# Patient Record
Sex: Male | Born: 1950
Health system: Southern US, Community
[De-identification: ages and names within clinical notes are randomized; demographics above are authoritative.]

## PROBLEM LIST (undated history)

## (undated) DIAGNOSIS — C4491 Basal cell carcinoma of skin, unspecified: Secondary | ICD-10-CM

## (undated) DIAGNOSIS — K219 Gastro-esophageal reflux disease without esophagitis: Secondary | ICD-10-CM

## (undated) DIAGNOSIS — I480 Paroxysmal atrial fibrillation: Secondary | ICD-10-CM

## (undated) DIAGNOSIS — I499 Cardiac arrhythmia, unspecified: Secondary | ICD-10-CM

## (undated) DIAGNOSIS — C801 Malignant (primary) neoplasm, unspecified: Secondary | ICD-10-CM

## (undated) DIAGNOSIS — K449 Diaphragmatic hernia without obstruction or gangrene: Secondary | ICD-10-CM

## (undated) DIAGNOSIS — I10 Essential (primary) hypertension: Secondary | ICD-10-CM

## (undated) DIAGNOSIS — T4145XA Adverse effect of unspecified anesthetic, initial encounter: Secondary | ICD-10-CM

## (undated) DIAGNOSIS — Z9289 Personal history of other medical treatment: Secondary | ICD-10-CM

## (undated) DIAGNOSIS — M1990 Unspecified osteoarthritis, unspecified site: Secondary | ICD-10-CM

## (undated) DIAGNOSIS — T8859XA Other complications of anesthesia, initial encounter: Secondary | ICD-10-CM

## (undated) DIAGNOSIS — Z87442 Personal history of urinary calculi: Secondary | ICD-10-CM

## (undated) DIAGNOSIS — Z9889 Other specified postprocedural states: Secondary | ICD-10-CM

## (undated) DIAGNOSIS — R931 Abnormal findings on diagnostic imaging of heart and coronary circulation: Secondary | ICD-10-CM

## (undated) HISTORY — DX: Basal cell carcinoma of skin, unspecified: C44.91

## (undated) HISTORY — DX: Essential (primary) hypertension: I10

## (undated) HISTORY — DX: Personal history of other medical treatment: Z92.89

## (undated) HISTORY — PX: KNEE SURGERY: SHX244

## (undated) HISTORY — PX: HERNIA REPAIR: SHX51

## (undated) HISTORY — PX: SHOULDER SURGERY: SHX246

## (undated) HISTORY — DX: Cardiac arrhythmia, unspecified: I49.9

## (undated) HISTORY — DX: Paroxysmal atrial fibrillation: I48.0

## (undated) HISTORY — DX: Abnormal findings on diagnostic imaging of heart and coronary circulation: R93.1

## (undated) HISTORY — DX: Other specified postprocedural states: Z98.890

## (undated) HISTORY — DX: Diaphragmatic hernia without obstruction or gangrene: K44.9

---

## 1988-12-26 HISTORY — PX: HERNIA REPAIR: SHX51

## 2001-08-01 ENCOUNTER — Ambulatory Visit (HOSPITAL_COMMUNITY): Admission: RE | Admit: 2001-08-01 | Discharge: 2001-08-01 | Payer: Self-pay | Admitting: Gastroenterology

## 2002-11-18 ENCOUNTER — Encounter: Payer: Self-pay | Admitting: Family Medicine

## 2002-11-18 ENCOUNTER — Encounter: Admission: RE | Admit: 2002-11-18 | Discharge: 2002-11-18 | Payer: Self-pay | Admitting: Family Medicine

## 2003-12-27 DIAGNOSIS — Z9289 Personal history of other medical treatment: Secondary | ICD-10-CM

## 2003-12-27 HISTORY — DX: Personal history of other medical treatment: Z92.89

## 2003-12-27 HISTORY — PX: CARDIAC CATHETERIZATION: SHX172

## 2004-08-24 ENCOUNTER — Inpatient Hospital Stay (HOSPITAL_BASED_OUTPATIENT_CLINIC_OR_DEPARTMENT_OTHER): Admission: RE | Admit: 2004-08-24 | Discharge: 2004-08-24 | Payer: Self-pay | Admitting: Interventional Cardiology

## 2008-05-01 ENCOUNTER — Encounter: Admission: RE | Admit: 2008-05-01 | Discharge: 2008-05-01 | Payer: Self-pay | Admitting: Sports Medicine

## 2008-07-22 ENCOUNTER — Ambulatory Visit (HOSPITAL_COMMUNITY): Admission: RE | Admit: 2008-07-22 | Discharge: 2008-07-22 | Payer: Self-pay | Admitting: Gastroenterology

## 2008-07-22 ENCOUNTER — Encounter (INDEPENDENT_AMBULATORY_CARE_PROVIDER_SITE_OTHER): Payer: Self-pay | Admitting: Gastroenterology

## 2011-05-10 NOTE — Op Note (Signed)
NAME:  David Vargas, David Vargas                ACCOUNT NO.:  [id number]   MEDICAL RECORD NO.:  [id number]          PATIENT TYPE:  AMB   LOCATION:  ENDO                         FACILITY:  [facility]   PHYSICIAN:  James L. Malon Kindle., M.D.DATE OF BIRTH:  March 08, 1951   DATE OF PROCEDURE:  07/22/2008  DATE OF DISCHARGE:                               OPERATIVE REPORT   PROCEDURE:  Colonoscopy and polypectomy.   MEDICATIONS:  Fentanyl 125 mcg and Versed 12.5 mg IV.   INDICATION:  A 60 year old gentleman with a strong family history of  colon cancer and polyps.  He had a colonoscopy a year ago with a flat  polyp in the ascending colon that was sheared down and recovered.  This  is done to make absolutely  certain that this polyp was gone, some of  the pieces were lost, and the pieces that were recovered showed  hyperplastic, but he does have a strong family history.   DESCRIPTION OF PROCEDURE:  Procedure had been explained to the patient  and consent obtained.  In the left lateral decubitus position, Pentax  pediatric scope was inserted and advanced.  The prep was somewhat  marginal with some sticky adherent pieces of solid material.  We reached  the cecum, ileocecal valve and appendiceal orifice were seen.  The scope  was withdrawn and the ascending colon with scar was seen where he had  had the previous polyp removed.  The transverse colon was seen well and  the mid descending colon approximately 80 cm, a polyp on a short stalk  was seen.  It was about 1 cm.  This was removed with a snare and sucked  through the scope.  The remainder of the descending colon was fine.  The  sigmoid colon revealed marked diverticulosis as before, no gross polyps  seen.  The rectum was free of polyps and retroflexion view was basically  normal.  Colon was decompressed.  The patient tolerated the procedure  well.  There were no immediate complications.   ASSESSMENT:  1. No residual polyp in the ascending colon.  2. New  polyp removed from the descending colon.   PLAN:  Routine post polypectomy instructions.  We will recommend  repeating procedure in 3 years.           ______________________________  Llana Aliment Malon Kindle., M.D.     Waldron Session  D:  07/22/2008  T:  07/23/2008  Job:  [id number]   cc:   Vikki Ports, M.D.

## 2011-05-13 NOTE — Cardiovascular Report (Signed)
NAME:  David Vargas, David Vargas                          ACCOUNT NO.:  [id number]   MEDICAL RECORD NO.:  [id number]                   PATIENT TYPE:  OIB   LOCATION:  6501                                 FACILITY:  [facility]   PHYSICIAN:  Lyn Records III, M.D.            DATE OF BIRTH:  March 12, 1951   DATE OF PROCEDURE:  08/24/2004  DATE OF DISCHARGE:                              CARDIAC CATHETERIZATION   INDICATION:  Exertional dyspnea with recent Cardiolite study demonstrating a  mid anterior wall defect and inferobasal defect. These findings were felt to  possibly represent coronary artery disease.  This study is being done to  evaluate for the presence of obstructive lesions.   PROCEDURE PERFORMED:  1.  Left heart catheterization.  2.  Selective coronary angiography.  3.  Left ventriculography.   DESCRIPTION:  After informed consent, a 4-French sheath was placed in the  right femoral artery using modified Seldinger technique.  A 4-French #4  right Judkins catheter was used for hemodynamic recordings, left  ventriculography by hand injection and selective right coronary angiography.  A 4-French #4 left Judkins catheter was used for left coronary angiography.  The patient tolerated the procedure without complications.   RESULTS:   I. HEMODYNAMIC DATA:  A.  Aortic pressure 121/82.  B.  Left ventricular pressure 124/20.   II. LEFT VENTRICULOGRAPHY:  Faint opacification is noted.  Overall  contractility appears normal.  Anterior and inferior walls appear to move  reasonably well.  No obvious regional wall motion abnormality.   III. CORONARY ANGIOGRAPHY:  A.  Left main coronary:  Normal.  B.  Left anterior descending coronary:  This is a large vessel that gives  origin to a large diagonal.  The LAD is transapical.  The mid LAD dipped  into what appears to be the interventricular septum.  Minimal systolic  pressure is noted on the distal limb of the LAD that transitioned out of the  severe  angulation.  The vessel has no significant obstructive lesion.  Diagonal was large and also free of any significant obstructive lesion.  Minimal luminal irregularities are noted in the mid LAD.  C.  Circumflex artery:  Circumflex trifurcates into three obtuse marginal  branches with minimal luminal irregularities noted.  No significant  obstruction is seen.  D.  Right coronary:  The right coronary artery is a large vessel.  It gives  rise to PDA and two small left ventricular branches.  This artery is  entirely normal.   CONCLUSIONS:  1.  Probable intramyocardial mid left anterior descending segment with      minimal systolic depression.  2.  No evidence of significant coronary atherosclerosis is noted in any of      the coronary vascular beds.  3.  Normal left ventricular function.  4.  Mildly elevated left ventricular end-diastolic pressure.   PLAN:  The patient should be monitored for the possible presence of  hypertension that has not currently been detected and if dyspnea continues  would recommend low dose diuretic therapy to possibly decrease LVEDP and  help better control his blood pressure.  No further cardiac evaluation is  felt to be present at this time unless dyspnea is progressive.  If dyspnea  is progressive, I would recommend a 2-D Doppler echocardiogram to rule out  LVH and possibility of an underlying hypertrophic cardiomyopathy.                                               Lesleigh Noe, M.D.    HWS/MEDQ  D:  08/24/2004  T:  08/24/2004  Job:  [id number]   cc:   Vikki Ports, M.D.  [address]  [place]  [place] [address]  Fax: [phone]

## 2011-05-13 NOTE — Procedures (Signed)
[facility]  Patient:    AUL, MANGIERI                       MRN: [id number] Proc. Date: 08/01/01 Adm. Date:  [id number] Attending:  Orland Mustard CC:         Vikki Ports, M.D.   Procedure Report  PROCEDURE PERFORMED:  Colonoscopy.  ENDOSCOPIST:  Llana Aliment. Randa Evens, M.D.  MEDICATIONS SED:  Fentanyl 10 mcg, Versed ____________ mg IV.  INSTRUMENT:  INDICATIONS:  Strong family history of colon polyps.  DESCRIPTION OF PROCEDURE:  The procedure had been explained to the patient and consent obtained.  With the patient in the left lateral decubitus position, the Olympus pediatric video colonoscope was inserted and advanced under direct visualization.  The prep was excellent and we were able to advance to the cecum without difficulty.  The right lower quadrant transilluminated.  The ileocecal valve seen.  The scope was withdrawn.  The cecum, ascending colon, hepatic flexure, transverse colon, splenic flexure, descending and sigmoid colon were seen well upon withdrawal.  No significant diverticular disease, no polyps seen.  Scope withdrawn, patient tolerated the procedure well. ASSESSMENT:  No evidence of colon polyps.  PLAN:  Will recommend repeating in five years. DD:  08/01/01 TD:  08/01/01 Job: [id number]

## 2011-10-26 ENCOUNTER — Other Ambulatory Visit: Payer: Self-pay | Admitting: Gastroenterology

## 2011-10-31 ENCOUNTER — Other Ambulatory Visit: Payer: Self-pay | Admitting: Dermatology

## 2012-03-24 ENCOUNTER — Emergency Department (HOSPITAL_BASED_OUTPATIENT_CLINIC_OR_DEPARTMENT_OTHER)
Admission: EM | Admit: 2012-03-24 | Discharge: 2012-03-24 | Disposition: A | Discharge status: Home / Self Care | Payer: 59 | Attending: Emergency Medicine | Admitting: Emergency Medicine

## 2012-03-24 ENCOUNTER — Encounter (HOSPITAL_BASED_OUTPATIENT_CLINIC_OR_DEPARTMENT_OTHER): Payer: Self-pay

## 2012-03-24 DIAGNOSIS — N200 Calculus of kidney: Secondary | ICD-10-CM | POA: Insufficient documentation

## 2012-03-24 DIAGNOSIS — R112 Nausea with vomiting, unspecified: Secondary | ICD-10-CM | POA: Insufficient documentation

## 2012-03-24 DIAGNOSIS — R109 Unspecified abdominal pain: Secondary | ICD-10-CM | POA: Insufficient documentation

## 2012-03-24 LAB — BASIC METABOLIC PANEL
BUN: 20 mg/dL (ref 6–23)
CO2: 23 mEq/L (ref 19–32)
Calcium: 9.7 mg/dL (ref 8.4–10.5)
Creatinine, Ser: 1.1 mg/dL (ref 0.50–1.35)
Glucose, Bld: 126 mg/dL — ABNORMAL HIGH (ref 70–99)

## 2012-03-24 MED ORDER — ONDANSETRON HCL 4 MG PO TABS: 4.0000 mg | ORAL_TABLET | Freq: Four times a day (QID) | ORAL | Status: AC

## 2012-03-24 MED ORDER — OXYCODONE-ACETAMINOPHEN 5-325 MG PO TABS: 1.0000 | ORAL_TABLET | ORAL | Status: AC | PRN

## 2012-03-24 MED ORDER — SODIUM CHLORIDE 0.9 % IV BOLUS (SEPSIS)
1000.0000 mL | Freq: Once | INTRAVENOUS | Status: AC
  Administered 2012-03-24: 1000 mL via INTRAVENOUS

## 2012-03-24 MED ORDER — TAMSULOSIN HCL 0.4 MG PO CAPS: 0.4000 mg | ORAL_CAPSULE | ORAL | Status: DC

## 2012-03-24 NOTE — ED Notes (Signed)
Pt is unable to provide urine specimen at this time.  ?

## 2012-03-24 NOTE — Discharge Instructions (Signed)

## 2012-03-24 NOTE — ED Provider Notes (Signed)
History     CSN: [id number]  Arrival date & time 03/24/12  1058   First MD Initiated Contact with Patient 03/24/12 1129      Chief Complaint  Patient presents with  . Flank Pain    (Consider location/radiation/quality/duration/timing/severity/associated sxs/prior treatment) HPI Comments: Patient began having flank pain on his right side last night.  He started to note some mild lower abdominal crampiness as well.  He fell he was unable to get comfortable last night.  He went to his primary care physician this morning and was found to have acute hematuria.  Patient had noted tissue and looked dark as well.  Patient's also had associated nausea and vomiting without fevers.  He's noted that the pain has been in his flank radiating down towards his lower abdomen.  He has been able to urinate but notes decreased urination.  He's had persistent nausea and vomiting but no diarrhea.  Patient has had prior kidney stones in the last time was approximate 6 months ago.  Patient states this feels similar.  His primary care physician sent him over here for IV fluids given the amount of ketones in his urine and his history of nausea and vomiting.  They also gave him Toradol and Phenergan for pain and nausea and the patient now reports that he has no further pain and no nausea at this time.  Patient is a 61 y.o. male presenting with flank pain. The history is provided by the patient. No language interpreter was used.  Flank Pain This is a recurrent problem. The current episode started yesterday. The problem occurs constantly. The problem has been gradually worsening. Associated symptoms include abdominal pain. Pertinent negatives include no chest pain, no headaches and no shortness of breath. The symptoms are aggravated by nothing. The symptoms are relieved by nothing.    Past Medical History  Diagnosis Date  . Kidney stones     Past Surgical History  Procedure Date  . Knee surgery   . Hernia repair     . Shoulder surgery     History reviewed. No pertinent family history.  History  Substance Use Topics  . Smoking status: Never Smoker   . Smokeless tobacco: Never Used  . Alcohol Use: Yes     few times a week      Review of Systems  Constitutional: Negative.  Negative for fever and chills.  HENT: Negative.   Eyes: Negative.  Negative for discharge and redness.  Respiratory: Negative.  Negative for cough and shortness of breath.   Cardiovascular: Negative.  Negative for chest pain.  Gastrointestinal: Positive for abdominal pain. Negative for nausea and vomiting.  Genitourinary: Positive for hematuria and flank pain.  Musculoskeletal: Negative.  Negative for back pain.  Skin: Negative.  Negative for color change and rash.  Neurological: Negative for syncope and headaches.  Hematological: Negative.  Negative for adenopathy.  Psychiatric/Behavioral: Negative.  Negative for confusion.  All other systems reviewed and are negative.    Allergies  Review of patient's allergies indicates no known allergies.  Home Medications   Current Outpatient Rx  Name Route Sig Dispense Refill  . CETIRIZINE HCL 10 MG PO TABS Oral Take 10 mg by mouth daily.    Marland Kitchen FLUTICASONE PROPIONATE 50 MCG/ACT NA SUSP Nasal Place 2 sprays into the nose daily.    Marland Kitchen KETOROLAC TROMETHAMINE 60 MG/2ML IJ SOLN Intramuscular Inject 60 mg into the muscle once.    Marland Kitchen LOSARTAN POTASSIUM 25 MG PO TABS Oral Take 25  mg by mouth daily.    Marland Kitchen PROMETHAZINE HCL 25 MG/ML IJ SOLN Intramuscular Inject 25 mg into the muscle once.      BP 108/76  Pulse 104  Temp(Src) 98.7 F (37.1 C) (Oral)  Resp 16  Ht 6\' 5"  (1.956 m)  Wt 280 lb (127.007 kg)  BMI 33.20 kg/m2  SpO2 97%  Physical Exam  Nursing note and vitals reviewed. Constitutional: He is oriented to person, place, and time. He appears well-developed and well-nourished.  Non-toxic appearance. He does not have a sickly appearance.  HENT:  Head: Normocephalic and  atraumatic.  Eyes: Conjunctivae, EOM and lids are normal. Pupils are equal, round, and reactive to light.  Neck: Trachea normal, normal range of motion and full passive range of motion without pain. Neck supple.  Cardiovascular: Normal rate, regular rhythm and normal heart sounds.   Pulmonary/Chest: Effort normal and breath sounds normal. No respiratory distress.  Abdominal: Soft. Normal appearance. He exhibits no distension. There is no tenderness. There is no rebound and no CVA tenderness.  Musculoskeletal: Normal range of motion.  Neurological: He is alert and oriented to person, place, and time. He has normal strength.  Skin: Skin is warm, dry and intact. No rash noted.  Psychiatric: He has a normal mood and affect. His behavior is normal. Judgment and thought content normal.    ED Course  Procedures (including critical care time)  Results for orders placed during the hospital encounter of 03/24/12  BASIC METABOLIC PANEL      Component Value Range   Sodium 139  135 - 145 (mEq/L)   Potassium 3.9  3.5 - 5.1 (mEq/L)   Chloride 103  96 - 112 (mEq/L)   CO2 23  19 - 32 (mEq/L)   Glucose, Bld 126 (*) 70 - 99 (mg/dL)   BUN 20  6 - 23 (mg/dL)   Creatinine, Ser 1.61  0.50 - 1.35 (mg/dL)   Calcium 9.7  8.4 - 09.6 (mg/dL)   GFR calc non Af Amer 70 (*) >90 (mL/min)   GFR calc Af Amer 81 (*) >90 (mL/min)    Patient's urinalysis from the [facility] physicians shows a specific gravity of 1.025, leukocyte negative, nitrate negative, glucose negative, ketones greater than 160, bili moderate, blood large.  Patient also had a CBC completed which showed a white blood cell count of 12.1 and a hemoglobin of 15.8.  MDM  Patient has a history that is consistent with kidney stone given the hematuria and the characteristic pain that he describes today.  As the patient is comfortable at this time he is declining further pain or nausea medicines.  He is here purely for some IV fluids and I will continue to  hydrate him here and recheck in his symptoms.  As the patient has a prior diagnosis of kidney stones in the characteristic story with blood I do not feel the need to perform another CT scan in this patient today.  I will have him followup with his primary care physician and with urology later this week for reevaluation if needed but he is likely to pass the stone on his own.        Nat Christen, MD 03/24/12 [phone]

## 2012-03-24 NOTE — ED Notes (Signed)
Pt states that he had onset of abdominal pain and R flank pain yesterday evening.  Pt states that he was seen by [facility] physicians and given pain and nausea medications, dx with kidney stones, sent here for IVF.  Hasn't been able to keep down food or fluids since last night.

## 2012-03-28 ENCOUNTER — Ambulatory Visit
Admission: RE | Admit: 2012-03-28 | Discharge: 2012-03-28 | Disposition: A | Discharge status: Home / Self Care | Payer: 59 | Source: Ambulatory Visit | Attending: Family Medicine | Admitting: Family Medicine

## 2012-03-28 ENCOUNTER — Other Ambulatory Visit: Payer: Self-pay | Admitting: Family Medicine

## 2012-03-28 DIAGNOSIS — R109 Unspecified abdominal pain: Secondary | ICD-10-CM

## 2012-12-24 ENCOUNTER — Other Ambulatory Visit: Payer: Self-pay | Admitting: Dermatology

## 2013-01-23 ENCOUNTER — Other Ambulatory Visit: Payer: Self-pay | Admitting: Dermatology

## 2013-05-02 ENCOUNTER — Other Ambulatory Visit: Payer: Self-pay | Admitting: Dermatology

## 2013-05-23 ENCOUNTER — Other Ambulatory Visit: Payer: Self-pay | Admitting: Dermatology

## 2013-05-30 ENCOUNTER — Other Ambulatory Visit: Payer: Self-pay | Admitting: Dermatology

## 2014-01-28 ENCOUNTER — Encounter: Payer: Self-pay | Admitting: Cardiology

## 2014-01-28 DIAGNOSIS — I480 Paroxysmal atrial fibrillation: Secondary | ICD-10-CM | POA: Insufficient documentation

## 2014-01-28 DIAGNOSIS — I4891 Unspecified atrial fibrillation: Secondary | ICD-10-CM

## 2014-01-28 DIAGNOSIS — I10 Essential (primary) hypertension: Secondary | ICD-10-CM

## 2014-02-18 ENCOUNTER — Other Ambulatory Visit: Payer: Self-pay | Admitting: Dermatology

## 2014-05-14 ENCOUNTER — Encounter: Payer: Self-pay | Admitting: Cardiology

## 2014-06-05 ENCOUNTER — Other Ambulatory Visit (HOSPITAL_COMMUNITY): Payer: 59

## 2014-06-05 ENCOUNTER — Ambulatory Visit: Payer: 59 | Admitting: Cardiology

## 2014-08-06 ENCOUNTER — Telehealth: Payer: Self-pay | Admitting: Cardiology

## 2014-08-06 NOTE — Telephone Encounter (Signed)
08-06-14 PT NOW HAS [employer] POLICY#[id number] OM#[id number] (COBRA PLAN) WILL CHANGE TO [employer]BS ADV PLAN 09-25-14/CH

## 2014-08-12 ENCOUNTER — Other Ambulatory Visit (HOSPITAL_COMMUNITY): Payer: 59

## 2014-08-28 ENCOUNTER — Other Ambulatory Visit: Payer: Self-pay | Admitting: Dermatology

## 2014-09-11 ENCOUNTER — Other Ambulatory Visit: Payer: Self-pay | Admitting: General Surgery

## 2014-09-11 DIAGNOSIS — I4891 Unspecified atrial fibrillation: Secondary | ICD-10-CM

## 2014-09-23 ENCOUNTER — Other Ambulatory Visit (INDEPENDENT_AMBULATORY_CARE_PROVIDER_SITE_OTHER): Payer: Commercial Managed Care - PPO

## 2014-09-23 ENCOUNTER — Other Ambulatory Visit: Payer: Self-pay

## 2014-09-23 DIAGNOSIS — I4891 Unspecified atrial fibrillation: Secondary | ICD-10-CM

## 2014-09-29 NOTE — Progress Notes (Signed)
  [place], [place] [address] [place], [place]  [address] Phone: [phone] Fax:  [phone]  Date:  09/30/2014   ID:  David Vargas, DOB [birth year], MRN [id number]  PCP:  Gennette Pac, MD  Cardiologist:  Fransico Him, MD    History of Present Illness: This is a 63yo male with a history of PAF and HTN who presents today for followup.  He is doing well.  He denies any chest pain, SOB, DOE, LE edema, dizziness, palpitations or syncope.   Wt Readings from Last 3 Encounters:  09/30/14 243 lb (110.224 kg)  03/24/12 280 lb (127.007 kg)     Past Medical History  Diagnosis Date  . Kidney stones   . Hypertension   . Basal cell carcinoma     followed by dermatology  . Arrhythmia     paroxysmal afib  . History of nuclear stress test 2005    abnormal, cardiac cath done   . Hiatal hernia     upper GI in 2003  . H/O colonoscopy     2009, polypoid colorectal mucosa found, Dr. Oletta Lamas advised repeat in 3 years    Current Outpatient Prescriptions  Medication Sig Dispense Refill  . aspirin 81 MG tablet Take 81 mg by mouth daily.      . cetirizine (ZYRTEC) 10 MG tablet Take 10 mg by mouth daily.      . fluticasone (FLONASE) 50 MCG/ACT nasal spray Place 2 sprays into the nose daily.      Marland Kitchen losartan (COZAAR) 25 MG tablet Take 25 mg by mouth daily.      Marland Kitchen OVER THE COUNTER MEDICATION Muscle relaxant      . tadalafil (CIALIS) 10 MG tablet Take 10 mg by mouth daily as needed for erectile dysfunction.       No current facility-administered medications for this visit.    Allergies:   No Known Allergies  Social History:  The patient  reports that he has never smoked. He has never used smokeless tobacco. He reports that he drinks alcohol. He reports that he does not use illicit drugs.   Family History:  The patient's family history includes Heart attack in his mother; Liver cancer in his father; Multiple sclerosis in his sister.   ROS:  Please see the history of present illness.       All other systems reviewed and negative.   PHYSICAL EXAM: VS:  BP 120/64  Pulse 71  Ht 6\' 5"  (1.956 m)  Wt 243 lb (110.224 kg)  BMI 28.81 kg/m2 Well nourished, well developed, in no acute distress HEENT: normal Neck: no JVD Cardiac:  normal S1, S2; RRR; no murmur Lungs:  clear to auscultation bilaterally, no wheezing, rhonchi or rales Abd: soft, nontender, no hepatomegaly Ext: no edema Skin: warm and dry Neuro:  CNs 2-12 intact, no focal abnormalities noted  EKG:     NSR at 71bpm with no ST changes  ASSESSMENT AND PLAN:  1. Paroxysmal atrial fibrillation maintaining NSR 2. HTN well controlled - continue Losartan 3.  Mildly dilated aortic root - last echo 08/2014 it was normal  Followup with me in 1 year  Signed, Fransico Him, MD [facility] 09/30/2014 10:14 AM

## 2014-09-30 ENCOUNTER — Ambulatory Visit (INDEPENDENT_AMBULATORY_CARE_PROVIDER_SITE_OTHER): Payer: Medicare Other | Admitting: Cardiology

## 2014-09-30 ENCOUNTER — Encounter: Payer: Self-pay | Admitting: Cardiology

## 2014-09-30 VITALS — BP 120/64 | HR 71 | Ht 77.0 in | Wt 243.0 lb

## 2014-09-30 DIAGNOSIS — I10 Essential (primary) hypertension: Secondary | ICD-10-CM

## 2014-09-30 DIAGNOSIS — I480 Paroxysmal atrial fibrillation: Secondary | ICD-10-CM

## 2014-09-30 NOTE — Patient Instructions (Signed)
Your physician recommends that you continue on your current medications as directed. Please refer to the Current Medication list given to you today.  Your physician wants you to follow-up in: 1 year. You will receive a reminder letter in the mail two months in advance. If you don't receive a letter, please call our office to schedule the follow-up appointment.  

## 2014-10-03 ENCOUNTER — Encounter: Payer: Self-pay | Admitting: Cardiology

## 2014-10-06 ENCOUNTER — Encounter: Payer: Self-pay | Admitting: Cardiology

## 2014-10-07 ENCOUNTER — Encounter: Payer: Self-pay | Admitting: Cardiology

## 2015-07-29 ENCOUNTER — Encounter: Payer: Self-pay | Admitting: Cardiology

## 2015-11-12 ENCOUNTER — Encounter: Payer: Self-pay | Admitting: Cardiology

## 2015-11-12 ENCOUNTER — Ambulatory Visit (INDEPENDENT_AMBULATORY_CARE_PROVIDER_SITE_OTHER): Payer: Self-pay | Admitting: Cardiology

## 2015-11-12 VITALS — BP 102/64 | HR 63 | Ht 77.0 in | Wt 238.8 lb

## 2015-11-12 DIAGNOSIS — I480 Paroxysmal atrial fibrillation: Secondary | ICD-10-CM | POA: Diagnosis not present

## 2015-11-12 DIAGNOSIS — I10 Essential (primary) hypertension: Secondary | ICD-10-CM | POA: Diagnosis not present

## 2015-11-12 DIAGNOSIS — I4891 Unspecified atrial fibrillation: Secondary | ICD-10-CM

## 2015-11-12 NOTE — Progress Notes (Signed)
Cardiology Office Note   Date:  11/12/2015   ID:  David Vargas, DOB 03/27/1951, MRN [id number]  PCP:  David Pac, MD  Cardiologist:  Dr. Radford Vargas    Chief Complaint  Patient presents with  . Hypertension    PAF      History of Present Illness: David Vargas is a 64 y.o. male who presents for PAF.  Last seen by Dr. Radford Vargas last year and he had been maintaining SR.  He also has HTN. He had a cardiac cath in 2005 for + nuc study, no significant CAD though probable intramyocardial mid LAD segment with minimal systolic depression.    Last echo 08/2014 with EF 60-65%, trivial TR .   Today no complaints, no chest pain, no SOB, no palpitations.Marland Kitchen  He is active walking the golf course.  Also with yard care.  His brother died this year with MS.       Past Medical History  Diagnosis Date  . Kidney stones   . Hypertension   . Basal cell carcinoma     followed by dermatology  . Arrhythmia     paroxysmal afib  . History of nuclear stress test 2005    abnormal, cardiac cath done   . Hiatal hernia     upper GI in 2003  . H/O colonoscopy     2009, polypoid colorectal mucosa found, Dr. Oletta Vargas advised repeat in 3 years    Past Surgical History  Procedure Laterality Date  . Knee surgery    . Hernia repair    . Shoulder surgery    . Cardiac catheterization  2005    no evidence of significamt atherosclerosis     Current Outpatient Prescriptions  Medication Sig Dispense Refill  . aspirin 81 MG tablet Take 81 mg by mouth daily.    . cetirizine (ZYRTEC) 10 MG tablet Take 10 mg by mouth daily.    Marland Kitchen esomeprazole (NEXIUM) 40 MG capsule Take 40 mg by mouth daily at 12 noon.    . fluticasone (FLONASE) 50 MCG/ACT nasal spray Place 2 sprays into the nose daily.    Marland Kitchen losartan (COZAAR) 25 MG tablet Take 25 mg by mouth daily.    . tadalafil (CIALIS) 10 MG tablet Take 10 mg by mouth daily as needed for erectile dysfunction.     No current facility-administered medications for this  visit.    Allergies:   Review of patient's allergies indicates no known allergies.    Social History:  The patient  reports that he has never smoked. He has never used smokeless tobacco. He reports that he drinks alcohol. He reports that he does not use illicit drugs.   Family History:  The patient's family history includes Heart attack in his mother; Liver cancer in his father; Multiple sclerosis in his sister.    ROS:  General:no colds or fevers, + weight loss 5 lbs since last year and 42 since 2013.  Skin:no rashes or ulcers HEENT:no blurred vision, no congestion CV:see HPI PUL:see HPI GI:no diarrhea constipation or melena, no indigestion GU:no hematuria, no dysuria MS:no joint pain, no claudication Neuro:no syncope, no lightheadedness Endo:no diabetes, no thyroid disease  Wt Readings from Last 3 Encounters:  11/12/15 238 lb 12.8 oz (108.319 kg)  09/30/14 243 lb (110.224 kg)  03/24/12 280 lb (127.007 kg)     PHYSICAL EXAM: VS:  BP 102/64 mmHg  Pulse 63  Ht 6\' 5"  (1.956 m)  Wt 238 lb 12.8 oz (108.319 kg)  BMI 28.31 kg/m2 , BMI Body mass index is 28.31 kg/(m^2). General:Pleasant affect, NAD Skin:Warm and dry, brisk capillary refill HEENT:normocephalic, sclera clear, mucus membranes moist Neck:supple, no JVD, no bruits  Heart:S1S2 RRR without murmur, gallup, rub or click Lungs:clear without rales, rhonchi, or wheezes [id number], non tender, + BS, do not palpate liver spleen or masses Ext:no lower ext edema, 2+ pedal pulses, 2+ radial pulses Neuro:alert and oriented X 3, MAE, follows commands, + facial symmetry    EKG:  EKG is ordered today. The ekg ordered today demonstrates SR no changes since EKG from last year.    Recent Labs: No results found for requested labs within last 365 days.    Lipid Panel No results found for: CHOL, TRIG, HDL, CHOLHDL, VLDL, LDLCALC, LDLDIRECT     Other studies Reviewed: Additional studies/ records that were reviewed today  include: cath in 2005, last echo. .   ASSESSMENT AND PLAN:  1.  PAF nor further episodes, occurred with kidney stone. On ASA  2. HTN well controlled continue losartan.   3. Lipids per PCP, they will send [place] a copy.   4. Mildly dilated aortic root - last echo 08/2014 it was normal   Current medicines are reviewed with the patient today.  The patient Has no concerns regarding medicines.  The following changes have been made:  See above Labs/ tests ordered today include:see above  Disposition:   FU:  see above  Signed, David Serge, NP  11/12/2015 1:50 PM    [facility] [place], [place], [place], [place] Phone: [phone]; Fax: [phone]

## 2015-11-12 NOTE — Patient Instructions (Signed)
Medication Instructions:   Your physician recommends that you continue on your current medications as directed. Please refer to the Current Medication list given to you today.    If you need a refill on your cardiac medications before your next appointment, please call your pharmacy.  Labwork: NONE ORDER TODAY    Testing/Procedures:  NONE ORDER TODAY    Follow-Up:  Your physician wants you to follow-up in: [place] will receive a reminder letter in the mail two months in advance. If you don't receive a letter, please call our office to schedule the follow-up appointment.     Any Other Special Instructions Will Be Listed Below (If Applicable).

## 2016-02-24 DIAGNOSIS — I10 Essential (primary) hypertension: Secondary | ICD-10-CM | POA: Diagnosis not present

## 2016-02-24 DIAGNOSIS — Z125 Encounter for screening for malignant neoplasm of prostate: Secondary | ICD-10-CM | POA: Diagnosis not present

## 2016-03-02 DIAGNOSIS — Z0000 Encounter for general adult medical examination without abnormal findings: Secondary | ICD-10-CM | POA: Diagnosis not present

## 2016-03-02 DIAGNOSIS — K219 Gastro-esophageal reflux disease without esophagitis: Secondary | ICD-10-CM | POA: Diagnosis not present

## 2016-03-02 DIAGNOSIS — Z85820 Personal history of malignant melanoma of skin: Secondary | ICD-10-CM | POA: Diagnosis not present

## 2016-03-02 DIAGNOSIS — N200 Calculus of kidney: Secondary | ICD-10-CM | POA: Diagnosis not present

## 2016-03-02 DIAGNOSIS — N529 Male erectile dysfunction, unspecified: Secondary | ICD-10-CM | POA: Diagnosis not present

## 2016-03-02 DIAGNOSIS — Z23 Encounter for immunization: Secondary | ICD-10-CM | POA: Diagnosis not present

## 2016-03-02 DIAGNOSIS — I10 Essential (primary) hypertension: Secondary | ICD-10-CM | POA: Diagnosis not present

## 2016-03-02 DIAGNOSIS — J309 Allergic rhinitis, unspecified: Secondary | ICD-10-CM | POA: Diagnosis not present

## 2016-03-02 DIAGNOSIS — I480 Paroxysmal atrial fibrillation: Secondary | ICD-10-CM | POA: Diagnosis not present

## 2016-03-09 DIAGNOSIS — D2271 Melanocytic nevi of right lower limb, including hip: Secondary | ICD-10-CM | POA: Diagnosis not present

## 2016-03-09 DIAGNOSIS — D1801 Hemangioma of skin and subcutaneous tissue: Secondary | ICD-10-CM | POA: Diagnosis not present

## 2016-03-09 DIAGNOSIS — D225 Melanocytic nevi of trunk: Secondary | ICD-10-CM | POA: Diagnosis not present

## 2016-03-09 DIAGNOSIS — D224 Melanocytic nevi of scalp and neck: Secondary | ICD-10-CM | POA: Diagnosis not present

## 2016-03-09 DIAGNOSIS — Z85820 Personal history of malignant melanoma of skin: Secondary | ICD-10-CM | POA: Diagnosis not present

## 2016-03-09 DIAGNOSIS — L218 Other seborrheic dermatitis: Secondary | ICD-10-CM | POA: Diagnosis not present

## 2016-03-09 DIAGNOSIS — D485 Neoplasm of uncertain behavior of skin: Secondary | ICD-10-CM | POA: Diagnosis not present

## 2016-03-09 DIAGNOSIS — D2239 Melanocytic nevi of other parts of face: Secondary | ICD-10-CM | POA: Diagnosis not present

## 2016-03-09 DIAGNOSIS — L570 Actinic keratosis: Secondary | ICD-10-CM | POA: Diagnosis not present

## 2016-03-09 DIAGNOSIS — D2272 Melanocytic nevi of left lower limb, including hip: Secondary | ICD-10-CM | POA: Diagnosis not present

## 2016-03-09 DIAGNOSIS — Z85828 Personal history of other malignant neoplasm of skin: Secondary | ICD-10-CM | POA: Diagnosis not present

## 2016-03-17 DIAGNOSIS — R21 Rash and other nonspecific skin eruption: Secondary | ICD-10-CM | POA: Diagnosis not present

## 2016-03-21 DIAGNOSIS — L0100 Impetigo, unspecified: Secondary | ICD-10-CM | POA: Diagnosis not present

## 2016-03-21 DIAGNOSIS — L0889 Other specified local infections of the skin and subcutaneous tissue: Secondary | ICD-10-CM | POA: Diagnosis not present

## 2016-03-21 DIAGNOSIS — Z85828 Personal history of other malignant neoplasm of skin: Secondary | ICD-10-CM | POA: Diagnosis not present

## 2016-03-21 DIAGNOSIS — B029 Zoster without complications: Secondary | ICD-10-CM | POA: Diagnosis not present

## 2016-04-07 DIAGNOSIS — M779 Enthesopathy, unspecified: Secondary | ICD-10-CM | POA: Diagnosis not present

## 2016-04-07 DIAGNOSIS — M71571 Other bursitis, not elsewhere classified, right ankle and foot: Secondary | ICD-10-CM | POA: Diagnosis not present

## 2016-06-09 DIAGNOSIS — R69 Illness, unspecified: Secondary | ICD-10-CM | POA: Diagnosis not present

## 2016-09-20 DIAGNOSIS — D2271 Melanocytic nevi of right lower limb, including hip: Secondary | ICD-10-CM | POA: Diagnosis not present

## 2016-09-20 DIAGNOSIS — Z85820 Personal history of malignant melanoma of skin: Secondary | ICD-10-CM | POA: Diagnosis not present

## 2016-09-20 DIAGNOSIS — D2371 Other benign neoplasm of skin of right lower limb, including hip: Secondary | ICD-10-CM | POA: Diagnosis not present

## 2016-09-20 DIAGNOSIS — D2261 Melanocytic nevi of right upper limb, including shoulder: Secondary | ICD-10-CM | POA: Diagnosis not present

## 2016-09-20 DIAGNOSIS — D225 Melanocytic nevi of trunk: Secondary | ICD-10-CM | POA: Diagnosis not present

## 2016-09-20 DIAGNOSIS — L821 Other seborrheic keratosis: Secondary | ICD-10-CM | POA: Diagnosis not present

## 2016-09-20 DIAGNOSIS — D2262 Melanocytic nevi of left upper limb, including shoulder: Secondary | ICD-10-CM | POA: Diagnosis not present

## 2016-09-20 DIAGNOSIS — D485 Neoplasm of uncertain behavior of skin: Secondary | ICD-10-CM | POA: Diagnosis not present

## 2016-09-20 DIAGNOSIS — Z85828 Personal history of other malignant neoplasm of skin: Secondary | ICD-10-CM | POA: Diagnosis not present

## 2016-09-20 DIAGNOSIS — D1801 Hemangioma of skin and subcutaneous tissue: Secondary | ICD-10-CM | POA: Diagnosis not present

## 2016-09-27 DIAGNOSIS — Z23 Encounter for immunization: Secondary | ICD-10-CM | POA: Diagnosis not present

## 2016-11-21 ENCOUNTER — Encounter: Payer: Self-pay | Admitting: Physician Assistant

## 2016-11-21 ENCOUNTER — Ambulatory Visit: Payer: Medicare Other | Admitting: Cardiology

## 2016-11-21 ENCOUNTER — Ambulatory Visit (INDEPENDENT_AMBULATORY_CARE_PROVIDER_SITE_OTHER): Payer: Medicare HMO | Admitting: Physician Assistant

## 2016-11-21 ENCOUNTER — Encounter (INDEPENDENT_AMBULATORY_CARE_PROVIDER_SITE_OTHER): Payer: Self-pay

## 2016-11-21 VITALS — BP 110/72 | HR 68 | Ht 76.0 in | Wt 238.0 lb

## 2016-11-21 DIAGNOSIS — E785 Hyperlipidemia, unspecified: Secondary | ICD-10-CM

## 2016-11-21 DIAGNOSIS — I10 Essential (primary) hypertension: Secondary | ICD-10-CM

## 2016-11-21 DIAGNOSIS — I4891 Unspecified atrial fibrillation: Secondary | ICD-10-CM

## 2016-11-21 NOTE — Patient Instructions (Signed)

## 2016-11-21 NOTE — Progress Notes (Signed)
Cardiology Office Note    Date:  11/21/2016   ID:  David Vargas, DOB 11-11-1951, MRN [id number]  PCP:  Gennette Pac, MD  Cardiologist: Dr. Radford Pax  Chief Complaint  Patient presents with  . Follow-up    History of Present Illness:  David Vargas is a 65 y.o. male history of PAF that occurred once with a kidney stone. He is on aspirin. Also has hypertension, hyperlipidemia and mildly dilated aortic root. Last echo 08/2014 was normal, LVEF 60-65% with trivial TR. Cardiac cath in 2005 for abnormal nuclear study no significant CAD though probable intramyocardial mid LAD segment with minimal systolic depression.  Patient comes in today for yearly follow-up. He has had no changes in his medical history over the past year. He continues to remain active playing golf and going to the gym although he like to lose about 10 pounds. He denies any chest pain, palpitations, dyspnea, dyspnea on exertion, edema, dizziness or presyncope. He feels quite well.    Past Medical History:  Diagnosis Date  . Arrhythmia    paroxysmal afib  . Basal cell carcinoma    followed by dermatology  . H/O colonoscopy    2009, polypoid colorectal mucosa found, Dr. Oletta Lamas advised repeat in 3 years  . Hiatal hernia    upper GI in 2003  . History of nuclear stress test 2005   abnormal, cardiac cath done   . Hypertension   . Kidney stones     Past Surgical History:  Procedure Laterality Date  . CARDIAC CATHETERIZATION  2005   no evidence of significamt atherosclerosis  . HERNIA REPAIR    . KNEE SURGERY    . SHOULDER SURGERY      Current Medications: Outpatient Medications Prior to Visit  Medication Sig Dispense Refill  . aspirin 81 MG tablet Take 81 mg by mouth daily.    . fluticasone (FLONASE) 50 MCG/ACT nasal spray Place 2 sprays into the nose daily.    Marland Kitchen losartan (COZAAR) 25 MG tablet Take 25 mg by mouth daily.    . tadalafil (CIALIS) 10 MG tablet Take 10 mg by mouth daily as needed for  erectile dysfunction.    . cetirizine (ZYRTEC) 10 MG tablet Take 10 mg by mouth daily.    Marland Kitchen esomeprazole (NEXIUM) 40 MG capsule Take 40 mg by mouth daily at 12 noon.     No facility-administered medications prior to visit.      Allergies:   Patient has no known allergies.   Social History   Social History  . Marital status: Married    Spouse name: David Vargas  . Number of children: David Vargas  . Years of education: David Vargas   Social History Main Topics  . Smoking status: Never Smoker  . Smokeless tobacco: Never Used  . Alcohol use Yes     Comment: few times a week  . Drug use: No  . Sexual activity: Not Asked   Other Topics Concern  . None   Social History Narrative  . None     Family History:  The patient's family history includes Heart attack in his mother; Liver cancer in his father; Multiple sclerosis in his sister.   ROS:   Please see the history of present illness.    Review of Systems  Constitution: Negative.  HENT: Negative.   Cardiovascular: Negative.   Respiratory: Negative.   Endocrine: Negative.   Hematologic/Lymphatic: Negative.   Musculoskeletal: Positive for back pain and joint pain.  Gastrointestinal: Negative.  Genitourinary: Negative.   Neurological: Negative.    All other systems reviewed and are negative.   PHYSICAL EXAM:   VS:  BP 110/72   Pulse 68   Ht 6\' 4"  (1.93 m)   Wt 238 lb (108 kg)   BMI 28.97 kg/m   Physical Exam  GEN: Well nourished, well developed, in no acute distress Neck: no JVD, carotid bruits, or masses Cardiac:RRR; no murmurs, rubs, or gallops  Respiratory:  clear to auscultation bilaterally, normal work of breathing GI: soft, nontender, nondistended, + BS Ext: without cyanosis, clubbing, or edema, Good distal pulses bilaterally MS: no deformity or atrophy Skin: warm and dry, no rash Psych: euthymic mood, full affect  Wt Readings from Last 3 Encounters:  11/21/16 238 lb (108 kg)  11/12/15 238 lb 12.8 oz (108.3 kg)  09/30/14 243  lb (110.2 kg)      Studies/Labs Reviewed:   EKG:  EKG is ordered today.  The ekg ordered today demonstrates NSR with IRBBB  Recent Labs: No results found for requested labs within last 8760 hours.   Lipid Panel No results found for: CHOL, TRIG, HDL, CHOLHDL, VLDL, LDLCALC, LDLDIRECT  Additional studies/ records that were reviewed today include:  2-D echo 09/23/14 Study Conclusions  - Left ventricle: The cavity size was normal. There was moderate   concentric hypertrophy. Systolic function was normal. The   estimated ejection fraction was in the range of 60% to 65%. Wall   motion was normal; there were no regional wall motion   abnormalities. Left ventricular diastolic function parameters   were normal. - Tricuspid valve: There was trivial regurgitation. - Pulmonary arteries: Systolic pressure was within the normal   range.     ASSESSMENT:    1. Atrial fibrillation, unspecified type ([place])   2. Essential hypertension, benign      PLAN:  In order of problems listed above:  PAF in the setting of a kidney stone, occurred once without recurrence. Patient on aspirin. Doing well. In normal sinus rhythm today. No change. Follow-up in 1 year with Dr. Radford Pax.  Essential hypertension well controlled on Cozaar  HLD not on lipid-lowering agents. Recently checked by primary care and was normal.    Medication Adjustments/Labs and Tests Ordered: Current medicines are reviewed at length with the patient today.  Concerns regarding medicines are outlined above.  Medication changes, Labs and Tests ordered today are listed in the Patient Instructions below. Patient Instructions  Medication Instructions:  Your physician recommends that you continue on your current medications as directed. Please refer to the Current Medication list given to you today.   Labwork: None  Testing/Procedures: None  Follow-Up: Your physician wants you to follow-up in: 1 year with Dr. Radford Pax. You will  receive a reminder letter in the mail two months in advance. If you don't receive a letter, please call our office to schedule the follow-up appointment.   Any Other Special Instructions Will Be Listed Below (If Applicable).     If you need a refill on your cardiac medications before your next appointment, please call your pharmacy.      Sumner Boast, PA-C  11/21/2016 8:35 AM    [facility] [address], [place], [place]  [address] Phone: [phone]; Fax: [phone]

## 2016-11-24 DIAGNOSIS — M1712 Unilateral primary osteoarthritis, left knee: Secondary | ICD-10-CM | POA: Diagnosis not present

## 2017-01-10 DIAGNOSIS — R69 Illness, unspecified: Secondary | ICD-10-CM | POA: Diagnosis not present

## 2017-01-17 DIAGNOSIS — S46012A Strain of muscle(s) and tendon(s) of the rotator cuff of left shoulder, initial encounter: Secondary | ICD-10-CM | POA: Diagnosis not present

## 2017-01-17 DIAGNOSIS — M1712 Unilateral primary osteoarthritis, left knee: Secondary | ICD-10-CM | POA: Diagnosis not present

## 2017-02-22 DIAGNOSIS — Z86010 Personal history of colonic polyps: Secondary | ICD-10-CM | POA: Diagnosis not present

## 2017-02-22 DIAGNOSIS — D126 Benign neoplasm of colon, unspecified: Secondary | ICD-10-CM | POA: Diagnosis not present

## 2017-02-22 DIAGNOSIS — K640 First degree hemorrhoids: Secondary | ICD-10-CM | POA: Diagnosis not present

## 2017-02-22 DIAGNOSIS — K5730 Diverticulosis of large intestine without perforation or abscess without bleeding: Secondary | ICD-10-CM | POA: Diagnosis not present

## 2017-02-28 DIAGNOSIS — I10 Essential (primary) hypertension: Secondary | ICD-10-CM | POA: Diagnosis not present

## 2017-02-28 DIAGNOSIS — Z125 Encounter for screening for malignant neoplasm of prostate: Secondary | ICD-10-CM | POA: Diagnosis not present

## 2017-02-28 DIAGNOSIS — D126 Benign neoplasm of colon, unspecified: Secondary | ICD-10-CM | POA: Diagnosis not present

## 2017-03-09 DIAGNOSIS — R972 Elevated prostate specific antigen [PSA]: Secondary | ICD-10-CM | POA: Diagnosis not present

## 2017-03-09 DIAGNOSIS — R8290 Unspecified abnormal findings in urine: Secondary | ICD-10-CM | POA: Diagnosis not present

## 2017-03-09 DIAGNOSIS — I480 Paroxysmal atrial fibrillation: Secondary | ICD-10-CM | POA: Diagnosis not present

## 2017-03-09 DIAGNOSIS — Z0000 Encounter for general adult medical examination without abnormal findings: Secondary | ICD-10-CM | POA: Diagnosis not present

## 2017-03-09 DIAGNOSIS — K219 Gastro-esophageal reflux disease without esophagitis: Secondary | ICD-10-CM | POA: Diagnosis not present

## 2017-03-09 DIAGNOSIS — I10 Essential (primary) hypertension: Secondary | ICD-10-CM | POA: Diagnosis not present

## 2017-03-09 DIAGNOSIS — Z85820 Personal history of malignant melanoma of skin: Secondary | ICD-10-CM | POA: Diagnosis not present

## 2017-03-09 DIAGNOSIS — J309 Allergic rhinitis, unspecified: Secondary | ICD-10-CM | POA: Diagnosis not present

## 2017-03-21 DIAGNOSIS — D2262 Melanocytic nevi of left upper limb, including shoulder: Secondary | ICD-10-CM | POA: Diagnosis not present

## 2017-03-21 DIAGNOSIS — Z85828 Personal history of other malignant neoplasm of skin: Secondary | ICD-10-CM | POA: Diagnosis not present

## 2017-03-21 DIAGNOSIS — D2239 Melanocytic nevi of other parts of face: Secondary | ICD-10-CM | POA: Diagnosis not present

## 2017-03-21 DIAGNOSIS — L570 Actinic keratosis: Secondary | ICD-10-CM | POA: Diagnosis not present

## 2017-03-21 DIAGNOSIS — D1801 Hemangioma of skin and subcutaneous tissue: Secondary | ICD-10-CM | POA: Diagnosis not present

## 2017-03-21 DIAGNOSIS — D2271 Melanocytic nevi of right lower limb, including hip: Secondary | ICD-10-CM | POA: Diagnosis not present

## 2017-03-21 DIAGNOSIS — L821 Other seborrheic keratosis: Secondary | ICD-10-CM | POA: Diagnosis not present

## 2017-03-21 DIAGNOSIS — Z85820 Personal history of malignant melanoma of skin: Secondary | ICD-10-CM | POA: Diagnosis not present

## 2017-03-21 DIAGNOSIS — D2261 Melanocytic nevi of right upper limb, including shoulder: Secondary | ICD-10-CM | POA: Diagnosis not present

## 2017-03-21 DIAGNOSIS — D225 Melanocytic nevi of trunk: Secondary | ICD-10-CM | POA: Diagnosis not present

## 2017-04-06 DIAGNOSIS — Z0000 Encounter for general adult medical examination without abnormal findings: Secondary | ICD-10-CM | POA: Diagnosis not present

## 2017-04-06 DIAGNOSIS — R8290 Unspecified abnormal findings in urine: Secondary | ICD-10-CM | POA: Diagnosis not present

## 2017-04-06 DIAGNOSIS — K219 Gastro-esophageal reflux disease without esophagitis: Secondary | ICD-10-CM | POA: Diagnosis not present

## 2017-04-06 DIAGNOSIS — Z85820 Personal history of malignant melanoma of skin: Secondary | ICD-10-CM | POA: Diagnosis not present

## 2017-04-06 DIAGNOSIS — I480 Paroxysmal atrial fibrillation: Secondary | ICD-10-CM | POA: Diagnosis not present

## 2017-04-06 DIAGNOSIS — I10 Essential (primary) hypertension: Secondary | ICD-10-CM | POA: Diagnosis not present

## 2017-04-06 DIAGNOSIS — J309 Allergic rhinitis, unspecified: Secondary | ICD-10-CM | POA: Diagnosis not present

## 2017-04-06 DIAGNOSIS — R972 Elevated prostate specific antigen [PSA]: Secondary | ICD-10-CM | POA: Diagnosis not present

## 2017-04-12 DIAGNOSIS — M1712 Unilateral primary osteoarthritis, left knee: Secondary | ICD-10-CM | POA: Diagnosis not present

## 2017-05-09 DIAGNOSIS — R310 Gross hematuria: Secondary | ICD-10-CM | POA: Diagnosis not present

## 2017-05-09 DIAGNOSIS — N281 Cyst of kidney, acquired: Secondary | ICD-10-CM | POA: Diagnosis not present

## 2017-05-09 DIAGNOSIS — N200 Calculus of kidney: Secondary | ICD-10-CM | POA: Diagnosis not present

## 2017-05-25 DIAGNOSIS — N200 Calculus of kidney: Secondary | ICD-10-CM | POA: Diagnosis not present

## 2017-05-25 DIAGNOSIS — R310 Gross hematuria: Secondary | ICD-10-CM | POA: Diagnosis not present

## 2017-06-13 DIAGNOSIS — M25562 Pain in left knee: Secondary | ICD-10-CM | POA: Diagnosis not present

## 2017-06-14 DIAGNOSIS — M25562 Pain in left knee: Secondary | ICD-10-CM | POA: Diagnosis not present

## 2017-06-21 DIAGNOSIS — M1712 Unilateral primary osteoarthritis, left knee: Secondary | ICD-10-CM | POA: Diagnosis not present

## 2017-06-30 DIAGNOSIS — M1712 Unilateral primary osteoarthritis, left knee: Secondary | ICD-10-CM | POA: Diagnosis not present

## 2017-07-07 DIAGNOSIS — M1712 Unilateral primary osteoarthritis, left knee: Secondary | ICD-10-CM | POA: Diagnosis not present

## 2017-07-17 DIAGNOSIS — M9905 Segmental and somatic dysfunction of pelvic region: Secondary | ICD-10-CM | POA: Diagnosis not present

## 2017-07-17 DIAGNOSIS — M9904 Segmental and somatic dysfunction of sacral region: Secondary | ICD-10-CM | POA: Diagnosis not present

## 2017-07-17 DIAGNOSIS — M9903 Segmental and somatic dysfunction of lumbar region: Secondary | ICD-10-CM | POA: Diagnosis not present

## 2017-07-17 DIAGNOSIS — M5136 Other intervertebral disc degeneration, lumbar region: Secondary | ICD-10-CM | POA: Diagnosis not present

## 2017-07-20 DIAGNOSIS — R69 Illness, unspecified: Secondary | ICD-10-CM | POA: Diagnosis not present

## 2017-07-27 DIAGNOSIS — M545 Low back pain: Secondary | ICD-10-CM | POA: Diagnosis not present

## 2017-07-27 DIAGNOSIS — M5136 Other intervertebral disc degeneration, lumbar region: Secondary | ICD-10-CM | POA: Diagnosis not present

## 2017-08-23 DIAGNOSIS — J309 Allergic rhinitis, unspecified: Secondary | ICD-10-CM | POA: Diagnosis not present

## 2017-08-23 DIAGNOSIS — K219 Gastro-esophageal reflux disease without esophagitis: Secondary | ICD-10-CM | POA: Diagnosis not present

## 2017-08-23 DIAGNOSIS — R972 Elevated prostate specific antigen [PSA]: Secondary | ICD-10-CM | POA: Diagnosis not present

## 2017-08-23 DIAGNOSIS — Z85820 Personal history of malignant melanoma of skin: Secondary | ICD-10-CM | POA: Diagnosis not present

## 2017-08-23 DIAGNOSIS — R8290 Unspecified abnormal findings in urine: Secondary | ICD-10-CM | POA: Diagnosis not present

## 2017-08-23 DIAGNOSIS — I10 Essential (primary) hypertension: Secondary | ICD-10-CM | POA: Diagnosis not present

## 2017-08-23 DIAGNOSIS — I480 Paroxysmal atrial fibrillation: Secondary | ICD-10-CM | POA: Diagnosis not present

## 2017-08-23 DIAGNOSIS — Z0000 Encounter for general adult medical examination without abnormal findings: Secondary | ICD-10-CM | POA: Diagnosis not present

## 2017-08-23 DIAGNOSIS — Z23 Encounter for immunization: Secondary | ICD-10-CM | POA: Diagnosis not present

## 2017-08-25 DIAGNOSIS — M545 Low back pain: Secondary | ICD-10-CM | POA: Diagnosis not present

## 2017-09-07 DIAGNOSIS — R69 Illness, unspecified: Secondary | ICD-10-CM | POA: Diagnosis not present

## 2017-09-09 ENCOUNTER — Emergency Department (HOSPITAL_COMMUNITY)
Admission: EM | Admit: 2017-09-09 | Discharge: 2017-09-09 | Disposition: A | Discharge status: Home / Self Care | Payer: Medicare HMO | Attending: Emergency Medicine | Admitting: Emergency Medicine

## 2017-09-09 ENCOUNTER — Emergency Department (HOSPITAL_COMMUNITY): Payer: Medicare HMO

## 2017-09-09 ENCOUNTER — Encounter (HOSPITAL_COMMUNITY): Payer: Self-pay | Admitting: Emergency Medicine

## 2017-09-09 DIAGNOSIS — Z79899 Other long term (current) drug therapy: Secondary | ICD-10-CM | POA: Diagnosis not present

## 2017-09-09 DIAGNOSIS — R109 Unspecified abdominal pain: Secondary | ICD-10-CM | POA: Diagnosis not present

## 2017-09-09 DIAGNOSIS — R112 Nausea with vomiting, unspecified: Secondary | ICD-10-CM | POA: Insufficient documentation

## 2017-09-09 DIAGNOSIS — N200 Calculus of kidney: Secondary | ICD-10-CM

## 2017-09-09 DIAGNOSIS — Z85828 Personal history of other malignant neoplasm of skin: Secondary | ICD-10-CM | POA: Diagnosis not present

## 2017-09-09 DIAGNOSIS — Z7982 Long term (current) use of aspirin: Secondary | ICD-10-CM | POA: Diagnosis not present

## 2017-09-09 DIAGNOSIS — I10 Essential (primary) hypertension: Secondary | ICD-10-CM | POA: Insufficient documentation

## 2017-09-09 LAB — URINALYSIS, ROUTINE W REFLEX MICROSCOPIC
BACTERIA UA: NONE SEEN
BILIRUBIN URINE: NEGATIVE
GLUCOSE, UA: NEGATIVE mg/dL
Ketones, ur: 20 mg/dL — AB
LEUKOCYTES UA: NEGATIVE
NITRITE: NEGATIVE
PROTEIN: NEGATIVE mg/dL
Specific Gravity, Urine: 1.023 (ref 1.005–1.030)
Squamous Epithelial / LPF: NONE SEEN
pH: 5 (ref 5.0–8.0)

## 2017-09-09 LAB — COMPREHENSIVE METABOLIC PANEL
ALT: 16 U/L — ABNORMAL LOW (ref 17–63)
ANION GAP: 11 (ref 5–15)
AST: 18 U/L (ref 15–41)
Albumin: 3.9 g/dL (ref 3.5–5.0)
Alkaline Phosphatase: 83 U/L (ref 38–126)
BILIRUBIN TOTAL: 1 mg/dL (ref 0.3–1.2)
BUN: 25 mg/dL — ABNORMAL HIGH (ref 6–20)
CALCIUM: 9.2 mg/dL (ref 8.9–10.3)
CO2: 22 mmol/L (ref 22–32)
Chloride: 102 mmol/L (ref 101–111)
Creatinine, Ser: 1.49 mg/dL — ABNORMAL HIGH (ref 0.61–1.24)
GFR, EST AFRICAN AMERICAN: 54 mL/min — AB (ref >60)
GFR, EST NON AFRICAN AMERICAN: 47 mL/min — AB (ref >60)
Glucose, Bld: 113 mg/dL — ABNORMAL HIGH (ref 65–99)
Potassium: 4.1 mmol/L (ref 3.5–5.1)
Sodium: 135 mmol/L (ref 135–145)
TOTAL PROTEIN: 7 g/dL (ref 6.5–8.1)

## 2017-09-09 LAB — CBC
HEMATOCRIT: 45.6 % (ref 39.0–52.0)
HEMOGLOBIN: 15.3 g/dL (ref 13.0–17.0)
MCH: 30.1 pg (ref 26.0–34.0)
MCHC: 33.6 g/dL (ref 30.0–36.0)
MCV: 89.6 fL (ref 78.0–100.0)
Platelets: 190 10*3/uL (ref 150–400)
RBC: 5.09 MIL/uL (ref 4.22–5.81)
RDW: 13.2 % (ref 11.5–15.5)
WBC: 12.8 10*3/uL — AB (ref 4.0–10.5)

## 2017-09-09 LAB — LIPASE, BLOOD: Lipase: 30 U/L (ref 11–51)

## 2017-09-09 MED ORDER — ONDANSETRON HCL 4 MG/2ML IJ SOLN
4.0000 mg | Freq: Once | INTRAMUSCULAR | Status: AC | PRN
  Administered 2017-09-09: 4 mg via INTRAVENOUS
  Filled 2017-09-09: qty 2

## 2017-09-09 MED ORDER — KETOROLAC TROMETHAMINE 30 MG/ML IJ SOLN
15.0000 mg | Freq: Once | INTRAMUSCULAR | Status: AC
  Administered 2017-09-09: 15 mg via INTRAVENOUS
  Filled 2017-09-09: qty 1

## 2017-09-09 MED ORDER — ONDANSETRON 4 MG PO TBDP: 4.0000 mg | ORAL_TABLET | Freq: Three times a day (TID) | ORAL | 0 refills | Status: DC | PRN

## 2017-09-09 MED ORDER — SODIUM CHLORIDE 0.9 % IV BOLUS (SEPSIS)
1000.0000 mL | Freq: Once | INTRAVENOUS | Status: AC
  Administered 2017-09-09: 1000 mL via INTRAVENOUS

## 2017-09-09 NOTE — ED Provider Notes (Signed)
[facility] DEPT Provider Note   CSN: [id number] Arrival date & time: 09/09/17  1547     History   Chief Complaint Chief Complaint  Patient presents with  . Flank Pain    HPI David Vargas is a 66 y.o. male.  HPI Patient presents concerned flank pain. Pain is right sided, began approximately 40 hours ago, awakening him from sleep. Since onset pain has been persistent, in the right flank and mid abdomen. Pain is sore, severe. There is associated hematuria, and diminished urine production as well as worsening nausea and multiple episodes of vomiting. Patient has been intolerant of oral medication. Patient equates the pain and other symptoms with prior episodes of nephrolithiasis. He notes that he is currently taking ciprofloxacin for elevated PSA level.  Past Medical History:  Diagnosis Date  . Arrhythmia    paroxysmal afib  . Basal cell carcinoma    followed by dermatology  . H/O colonoscopy    2009, polypoid colorectal mucosa found, Dr. Oletta Lamas advised repeat in 3 years  . Hiatal hernia    upper GI in 2003  . History of nuclear stress test 2005   abnormal, cardiac cath done   . Hypertension   . Kidney stones     Patient Active Problem List   Diagnosis Date Noted  . Atrial fibrillation ([place]) 01/28/2014  . Essential hypertension, benign 01/28/2014    Past Surgical History:  Procedure Laterality Date  . CARDIAC CATHETERIZATION  2005   no evidence of significamt atherosclerosis  . HERNIA REPAIR    . KNEE SURGERY    . SHOULDER SURGERY         Home Medications    Prior to Admission medications   Medication Sig Start Date End Date Taking? Authorizing Provider  aspirin 81 MG tablet Take 81 mg by mouth daily.    [provider]  fluticasone (FLONASE) 50 MCG/ACT nasal spray Place 2 sprays into the nose daily.    [provider]  losartan (COZAAR) 25 MG tablet Take 25 mg by mouth daily.    [provider]  tadalafil (CIALIS) 10  MG tablet Take 10 mg by mouth daily as needed for erectile dysfunction.    [provider]    Family History Family History  Problem Relation Age of Onset  . Heart attack Mother   . Liver cancer Father   . Multiple sclerosis Sister     Social History Social History  Substance Use Topics  . Smoking status: Never Smoker  . Smokeless tobacco: Never Used  . Alcohol use Yes     Comment: few times a week     Allergies   Patient has no known allergies.   Review of Systems Review of Systems  Constitutional:       Per HPI, otherwise negative  HENT:       Per HPI, otherwise negative  Respiratory:       Per HPI, otherwise negative  Cardiovascular:       Per HPI, otherwise negative  Gastrointestinal: Positive for abdominal pain, nausea and vomiting.  Endocrine:       Negative aside from HPI  Genitourinary:       Neg aside from HPI   Musculoskeletal:       Per HPI, otherwise negative  Skin: Negative.   Neurological: Negative for syncope.     Physical Exam Updated Vital Signs BP (!) 141/92 (BP Location: Left Arm)   Pulse 66   Temp 97.9 F (36.6 C) (  Oral)   Resp 20   Ht 6\' 4"  (1.93 m)   Wt 102.1 kg (225 lb)   SpO2 98%   BMI 27.39 kg/m   Physical Exam  Constitutional: He is oriented to person, place, and time. He appears well-developed. No distress.  HENT:  Head: Normocephalic and atraumatic.  Eyes: Conjunctivae and EOM are normal.  Cardiovascular: Normal rate and regular rhythm.   Pulmonary/Chest: Effort normal. No stridor. No respiratory distress.  Abdominal: He exhibits no distension. There is tenderness.  tenderness in the mid abdomen, right flank  Musculoskeletal: He exhibits no edema.  Neurological: He is alert and oriented to person, place, and time.  Skin: Skin is warm and dry.  Psychiatric: He has a normal mood and affect.  Nursing note and vitals reviewed.    ED Treatments / Results  Labs (all labs ordered are listed, but only abnormal  results are displayed) Labs Reviewed  COMPREHENSIVE METABOLIC PANEL - Abnormal; Notable for the following:       Result Value   Glucose, Bld 113 (*)    BUN 25 (*)    Creatinine, Ser 1.49 (*)    ALT 16 (*)    GFR calc non Af Amer 47 (*)    GFR calc Af Amer 54 (*)    All other components within normal limits  CBC - Abnormal; Notable for the following:    WBC 12.8 (*)    All other components within normal limits  URINALYSIS, ROUTINE W REFLEX MICROSCOPIC - Abnormal; Notable for the following:    APPearance HAZY (*)    Hgb urine dipstick LARGE (*)    Ketones, ur 20 (*)    All other components within normal limits  LIPASE, BLOOD    Radiology US Renal  Result Date: 09/09/2017 CLINICAL DATA:  Right flank pain EXAM: RENAL / URINARY TRACT ULTRASOUND COMPLETE COMPARISON:  CT 05/09/2017 FINDINGS: Right Kidney: Length: 12.1 cm. Small right-sided renal cysts the largest is approximately 1.5 x 1 x 1 cm in the lower pole, stable in appearance relative to prior CT allowing for operator dependent imaging differences. Shadowing nonobstructing renal calculi are noted, the largest approximately 6 mm upon my measurements in the interpolar aspect. Left Kidney: Length: 15.9 cm. Echogenicity within normal limits. Simple left upper pole renal cyst measuring 5.3 x 4.4 x 6.7 cm. A 4 mm nonobstructing calculus is also noted in the interpolar aspect of the left kidney. Bladder: Decompressed and nearly empty limiting assessment. No calculus is identified within. IMPRESSION: 1. No hydronephrosis of either kidney. 2. Bilateral renal cysts and nonobstructing bilateral renal calculi as above described. Decompressed appearance of the bladder. Electronically Signed   By: Ashley Royalty M.D.   On: 09/09/2017 17:46    Procedures Procedures (including critical care time)  Medications Ordered in ED Medications  ondansetron (ZOFRAN) injection 4 mg (4 mg Intravenous Given 09/09/17 1622)  ketorolac (TORADOL) 30 MG/ML injection 15  mg (15 mg Intravenous Given 09/09/17 1652)  sodium chloride 0.9 % bolus 1,000 mL (0 mLs Intravenous Stopped 09/09/17 1834)     Initial Impression / Assessment and Plan / ED Course  I have reviewed the triage vital signs and the nursing notes.  Pertinent labs & imaging results that were available during my care of the patient were reviewed by me and considered in my medical decision making (see chart for details).  6:47 PM Patient appears better, states that he feels substantially better. We discussed all findings at length including evidence for dehydration,  and persistent kidney stones. No hydronephrosis, no evidence for obstruction or infection. With his improvement, and the aforementioned reassuring findings, the patient was discharged in stable condition to follow-up with his urologist.  Final Clinical Impressions(s) / ED Diagnoses   Final diagnoses:  Flank pain  Kidney stone    New Prescriptions New Prescriptions   ONDANSETRON (ZOFRAN ODT) 4 MG DISINTEGRATING TABLET    Take 1 tablet (4 mg total) by mouth every 8 (eight) hours as needed for nausea or vomiting.     Carmin Muskrat, MD 09/09/17 [phone]

## 2017-09-09 NOTE — ED Notes (Signed)
Pt out of room for testing. 

## 2017-09-09 NOTE — Discharge Instructions (Signed)
As discussed, your evaluation today has been largely reassuring.  But, it is important that you monitor your condition carefully, and do not hesitate to return to the ED if you develop new, or concerning changes in your condition. ? ?Otherwise, please follow-up with your physician for appropriate ongoing care. ? ?

## 2017-09-09 NOTE — ED Triage Notes (Signed)
Pt c/o right flank pain that began this morning. Endorses n/v, hx kidney stones. Pt noted blood in urine this am.

## 2017-09-11 DIAGNOSIS — R310 Gross hematuria: Secondary | ICD-10-CM | POA: Diagnosis not present

## 2017-09-11 DIAGNOSIS — N202 Calculus of kidney with calculus of ureter: Secondary | ICD-10-CM | POA: Diagnosis not present

## 2017-09-11 DIAGNOSIS — R972 Elevated prostate specific antigen [PSA]: Secondary | ICD-10-CM | POA: Diagnosis not present

## 2017-09-21 DIAGNOSIS — M545 Low back pain: Secondary | ICD-10-CM | POA: Diagnosis not present

## 2017-09-21 DIAGNOSIS — M461 Sacroiliitis, not elsewhere classified: Secondary | ICD-10-CM | POA: Diagnosis not present

## 2017-09-26 DIAGNOSIS — D2371 Other benign neoplasm of skin of right lower limb, including hip: Secondary | ICD-10-CM | POA: Diagnosis not present

## 2017-09-26 DIAGNOSIS — D225 Melanocytic nevi of trunk: Secondary | ICD-10-CM | POA: Diagnosis not present

## 2017-09-26 DIAGNOSIS — Z85820 Personal history of malignant melanoma of skin: Secondary | ICD-10-CM | POA: Diagnosis not present

## 2017-09-26 DIAGNOSIS — D1801 Hemangioma of skin and subcutaneous tissue: Secondary | ICD-10-CM | POA: Diagnosis not present

## 2017-09-26 DIAGNOSIS — L821 Other seborrheic keratosis: Secondary | ICD-10-CM | POA: Diagnosis not present

## 2017-09-26 DIAGNOSIS — D2261 Melanocytic nevi of right upper limb, including shoulder: Secondary | ICD-10-CM | POA: Diagnosis not present

## 2017-09-26 DIAGNOSIS — D692 Other nonthrombocytopenic purpura: Secondary | ICD-10-CM | POA: Diagnosis not present

## 2017-09-26 DIAGNOSIS — Z85828 Personal history of other malignant neoplasm of skin: Secondary | ICD-10-CM | POA: Diagnosis not present

## 2017-09-26 DIAGNOSIS — L738 Other specified follicular disorders: Secondary | ICD-10-CM | POA: Diagnosis not present

## 2017-10-03 DIAGNOSIS — R310 Gross hematuria: Secondary | ICD-10-CM | POA: Diagnosis not present

## 2017-10-03 DIAGNOSIS — N201 Calculus of ureter: Secondary | ICD-10-CM | POA: Diagnosis not present

## 2017-10-18 DIAGNOSIS — M461 Sacroiliitis, not elsewhere classified: Secondary | ICD-10-CM | POA: Diagnosis not present

## 2017-10-18 DIAGNOSIS — R972 Elevated prostate specific antigen [PSA]: Secondary | ICD-10-CM | POA: Diagnosis not present

## 2017-10-18 DIAGNOSIS — Z125 Encounter for screening for malignant neoplasm of prostate: Secondary | ICD-10-CM | POA: Diagnosis not present

## 2017-10-18 DIAGNOSIS — M1712 Unilateral primary osteoarthritis, left knee: Secondary | ICD-10-CM | POA: Diagnosis not present

## 2017-10-20 DIAGNOSIS — M1712 Unilateral primary osteoarthritis, left knee: Secondary | ICD-10-CM | POA: Diagnosis not present

## 2017-10-26 ENCOUNTER — Telehealth: Payer: Self-pay

## 2017-10-26 NOTE — Telephone Encounter (Signed)
   [facility] Pre-operative Risk Assessment    Request for surgical clearance:  1. What type of surgery is being performed? Left total knee replacement   2. When is this surgery scheduled? 11/28/2017   3. Are there any medications that need to be held prior to surgery and how long? None listed. (Patient is on ASA 81 mg daily)   4. Practice name and name of physician performing surgery? [facility] // Dr. Fredonia Highland   5. What is your office phone and fax number?  1. Phone: [phone] Claiborne Billings ex: [phone] 2. Fax: [phone] attn: Kelly  6. Anesthesia type (None, local, MAC, general) ? Not specified.

## 2017-10-26 NOTE — Telephone Encounter (Signed)
Spoke with pt and he already has an appt with Ermalinda Barrios, PA-C, and we will address clearance for knee replacement at that time. Pt agreed since his surgery isn't until 11/28/17.   Pt thanked me for the call.

## 2017-10-26 NOTE — Telephone Encounter (Signed)
    Chart reviewed as part of pre-operative protocol coverage. Because of David Vargas's past medical history and time since last visit, he/she will require a follow-up visit in order to better assess preoperative cardiovascular risk.   Pre-op covering staff: - Please let patient know pre-op clearance will be addressed at f/u appointment. - Please contact requesting surgeon's office via preferred method (i.e, phone, fax) to inform them of need for appointment prior to surgery - has appt 11/07/17.  I will also forward this to Ermalinda Barrios PA-C to make her aware that she'll be seeing the patient and at that time should address pre-op eval.  Charlie Pitter, PA-C  10/26/2017, 11:41 AM

## 2017-11-06 DIAGNOSIS — Z01818 Encounter for other preprocedural examination: Secondary | ICD-10-CM | POA: Insufficient documentation

## 2017-11-06 DIAGNOSIS — E785 Hyperlipidemia, unspecified: Secondary | ICD-10-CM | POA: Insufficient documentation

## 2017-11-06 NOTE — Progress Notes (Signed)
Cardiology Office Note    Date:  11/07/2017   ID:  David Vargas, DOB 1951-12-20, MRN [id number]  PCP:  Hulan Fess, MD  Cardiologist: Dr. Fransico Him  Chief Complaint  Patient presents with  . Atrial Fibrillation    History of Present Illness:  David Vargas is a 66 y.o. male with history of PAF that occurred once with a kidney stone and he has been treated with aspirin.  He also has hypertension, hyperlipidemia and mildly dilated aortic root.  Last echo in 2015 was normal LVEF 60-65% with trivial TR and normal aortic root.  Cardiac catheterization in 2005 for abnormal nuclear study showed no significant CAD echo probable intramyocardial mid LAD segment with minimal systolic depression.  I last saw the patient 11/21/16 for follow up and he was doing well.66  Patient is here for preoperative clearance before undergoing left total knee replacement 11/28/17 by Dr. Fredonia Highland.Was walking 9 holes of golf without difficulty but had to stop 3-4 months ago b/c of knee problems.  Denies any chest pain, palpitations, dyspnea, dyspnea on exertion, dizziness or presyncope.  No recurrence of atrial fibrillation.  Past Medical History:  Diagnosis Date  . Arrhythmia    paroxysmal afib  . Basal cell carcinoma    followed by dermatology  . H/O colonoscopy    2009, polypoid colorectal mucosa found, Dr. Oletta Lamas advised repeat in 3 years  . Hiatal hernia    upper GI in 2003  . History of nuclear stress test 2005   abnormal, cardiac cath done   . Hypertension   . Kidney stones     Past Surgical History:  Procedure Laterality Date  . CARDIAC CATHETERIZATION  2005   no evidence of significamt atherosclerosis  . HERNIA REPAIR    . KNEE SURGERY    . SHOULDER SURGERY      Current Medications: Current Meds  Medication Sig  . aspirin 81 MG tablet Take 81 mg by mouth daily.  . fluticasone (FLONASE) 50 MCG/ACT nasal spray Place 2 sprays into the nose daily.  Marland Kitchen losartan (COZAAR) 25 MG tablet  Take 25 mg by mouth daily.  . ondansetron (ZOFRAN ODT) 4 MG disintegrating tablet Take 1 tablet (4 mg total) by mouth every 8 (eight) hours as needed for nausea or vomiting.  . tadalafil (CIALIS) 10 MG tablet Take 10 mg by mouth daily as needed for erectile dysfunction.     Allergies:   Patient has no known allergies.   Social History   Socioeconomic History  . Marital status: Married    Spouse name: None  . Number of children: None  . Years of education: None  . Highest education level: None  Social Needs  . Financial resource strain: None  . Food insecurity - worry: None  . Food insecurity - inability: None  . Transportation needs - medical: None  . Transportation needs - non-medical: None  Occupational History  . None  Tobacco Use  . Smoking status: Never Smoker  . Smokeless tobacco: Never Used  Substance and Sexual Activity  . Alcohol use: Yes    Comment: few times a week  . Drug use: No  . Sexual activity: None  Other Topics Concern  . None  Social History Narrative  . None     Family History:  The patient's family history includes Heart attack in his mother; Liver cancer in his father; Multiple sclerosis in his sister.   ROS:   Please see the history of  present illness.    Review of Systems  Constitution: Negative.  HENT: Negative.   Cardiovascular: Negative.   Respiratory: Negative.   Endocrine: Negative.   Hematologic/Lymphatic: Negative.   Musculoskeletal: Positive for back pain, joint pain and joint swelling.  Gastrointestinal: Negative.   Genitourinary: Negative.   Neurological: Negative.    All other systems reviewed and are negative.   PHYSICAL EXAM:   VS:  BP 118/70   Pulse 75   Ht 6\' 4"  (1.93 m)   Wt 229 lb 1.9 oz (103.9 kg)   SpO2 98%   BMI 27.89 kg/m   Physical Exam  GEN: Well nourished, well developed, in no acute distress  Neck: no JVD, carotid bruits, or masses Cardiac:RRR; no murmurs, rubs, or gallops  Respiratory:  clear to  auscultation bilaterally, normal work of breathing GI: soft, nontender, nondistended, + BS Ext: without cyanosis, clubbing, or edema, Good distal pulses bilaterally Neuro:  Alert and Oriented x 3 Psych: euthymic mood, full affect  Wt Readings from Last 3 Encounters:  11/07/17 229 lb 1.9 oz (103.9 kg)  09/09/17 225 lb (102.1 kg)  11/21/16 238 lb (108 kg)      Studies/Labs Reviewed:   EKG:  EKG is ordered today.  The ekg ordered today demonstrates normal sinus rhythm with incomplete right bundle branch block, unchanged from prior tracings  Recent Labs: 09/09/2017: ALT 16; BUN 25; Creatinine, Ser 1.49; Hemoglobin 15.3; Platelets 190; Potassium 4.1; Sodium 135   Lipid Panel No results found for: CHOL, TRIG, HDL, CHOLHDL, VLDL, LDLCALC, LDLDIRECT  Additional studies/ records that were reviewed today include:    Cardiac catheterization 2005CONCLUSIONS:  1.  Probable intramyocardial mid left anterior descending segment with      minimal systolic depression.  2.  No evidence of significant coronary atherosclerosis is noted in any of      the coronary vascular beds.  3.  Normal left ventricular function.  4.  Mildly elevated left ventricular end-diastolic pressure.    PLAN:  The patient should be monitored for the possible presence of  hypertension that has not currently been detected and if dyspnea continues  would recommend low dose diuretic therapy to possibly decrease LVEDP and  help better control his blood pressure.  No further cardiac evaluation is  felt to be present at this time unless dyspnea is progressive.  If dyspnea  is progressive, I would recommend a 2-D Doppler echocardiogram to rule out  LVH and possibility of an underlying hypertrophic cardiomyopathy.                                                  Sinclair Grooms, M.D.      HWS/MEDQ  D:  08/24/2004  T  2D echo 2015Study Conclusions  - Left ventricle: The cavity size was normal. There was moderate    concentric hypertrophy. Systolic function was normal. The   estimated ejection fraction was in the range of 60% to 65%. Wall   motion was normal; there were no regional wall motion   abnormalities. Left ventricular diastolic function parameters   were normal. - Tricuspid valve: There was trivial regurgitation. - Pulmonary arteries: Systolic pressure was within the normal   range.   ASSESSMENT:    1. Preoperative clearance   2. Essential hypertension, benign   3. Atrial fibrillation, unspecified type ([place])  4. Mixed hyperlipidemia      PLAN:  In order of problems listed above:  Preoperative clearance for total knee replacement by Dr. Fredonia Highland 11/28/17.  Patient had nonobstructive CAD on cardiac catheterization 2005 after an abnormal nuclear stress test.  With probable intramyocardial bridge mid LAD with minimal systolic depression.  He had PAF once in the setting of a kidney stone.  He has had no recurrence.  According to the revised cardiac risk index he is very low risk for surgery and has a high functional capacity with 8.97 METs.  Patient may proceed with surgery and follow-up with Dr. Radford Pax in 1 year. According to the Revised Cardiac Risk Index (RCRI), his Perioperative Risk of Major Cardiac Event is (%): 0.4  His Functional Capacity in METs is: 8.97 according to the [employer] Activity Status Index (DASI).    Essential hypertension well-controlled  PAF occurred once in the setting of a kidney stone no recurrence  Mixed hyperlipidemia followed by primary care and cholesterol has been stable and managed with diet and exercise.    Medication Adjustments/Labs and Tests Ordered: Current medicines are reviewed at length with the patient today.  Concerns regarding medicines are outlined above.  Medication changes, Labs and Tests ordered today are listed in the Patient Instructions below. Patient Instructions  Medication Instructions: Your physician recommends that you continue on  your current medications as directed. Please refer to the Current Medication list given to you today.  Labwork: None Ordered  Procedures/Testing: None Ordered  Follow-Up: Your physician wants you to follow-up in: 1 YEAR with Dr. Radford Pax. You will receive a reminder letter in the mail two months in advance. If you don't receive a letter, please call our office to schedule the follow-up appointment.   If you need a refill on your cardiac medications before your next appointment, please call your pharmacy.       Sumner Boast, PA-C  11/07/2017 8:52 AM    [facility] [address], [place], [place]  [address] Phone: [phone]; Fax: [phone]

## 2017-11-07 ENCOUNTER — Encounter: Payer: Self-pay | Admitting: Physician Assistant

## 2017-11-07 ENCOUNTER — Ambulatory Visit: Payer: Medicare HMO | Admitting: Physician Assistant

## 2017-11-07 VITALS — BP 118/70 | HR 75 | Ht 76.0 in | Wt 229.1 lb

## 2017-11-07 DIAGNOSIS — E782 Mixed hyperlipidemia: Secondary | ICD-10-CM | POA: Diagnosis not present

## 2017-11-07 DIAGNOSIS — I10 Essential (primary) hypertension: Secondary | ICD-10-CM | POA: Diagnosis not present

## 2017-11-07 DIAGNOSIS — I4891 Unspecified atrial fibrillation: Secondary | ICD-10-CM | POA: Diagnosis not present

## 2017-11-07 DIAGNOSIS — Z01818 Encounter for other preprocedural examination: Secondary | ICD-10-CM

## 2017-11-07 DIAGNOSIS — Z01810 Encounter for preprocedural cardiovascular examination: Secondary | ICD-10-CM

## 2017-11-07 NOTE — Patient Instructions (Addendum)
Medication Instructions: Your physician recommends that you continue on your current medications as directed. Please refer to the Current Medication list given to you today.  Labwork: None Ordered  Procedures/Testing: None Ordered  Follow-Up: Your physician wants you to follow-up in: 1 YEAR with Dr. Radford Pax. You will receive a reminder letter in the mail two months in advance. If you don't receive a letter, please call our office to schedule the follow-up appointment.   If you need a refill on your cardiac medications before your next appointment, please call your pharmacy.

## 2017-11-13 DIAGNOSIS — M1712 Unilateral primary osteoarthritis, left knee: Secondary | ICD-10-CM | POA: Diagnosis not present

## 2017-11-13 NOTE — H&P (Signed)
PREOPERATIVE H&P Patient ID: David Vargas MRN: [id number] DOB/AGE: [birth year] 66 y.o.  Chief Complaint: OA LEFT KNEE  Planned Procedure Date: 11/28/2017 Medical Clearance by Dr. Rex Kras Cardiac Clearance by Dr. Radford Pax   HPI: David Vargas is a 66 y.o. male with a history of PAF in the setting of kidney stones, HTN, HLD who presents for evaluation of OA LEFT KNEE. The patient has a history of pain and functional disability in the left knee due to arthritis and has failed non-surgical conservative treatments for greater than 12 weeks to include NSAID's and/or analgesics, corticosteriod injections, viscosupplementation injections and activity modification.  Onset of symptoms was gradual, starting 2 years ago with gradually worsening course since that time. The patient noted prior procedures on the knee to include  arthroscopy and menisectomy on the left knee.  Patient currently rates pain at 7 out of 10 with activity. Patient has night pain, worsening of pain with activity and weight bearing and pain that interferes with activities of daily living.  Patient has evidence of periarticular osteophytes and joint space narrowing on x-ray.  Partial and full-thickness cartilage loss on MRI. There is no active infection.  Past Medical History:  Diagnosis Date  . Arrhythmia    paroxysmal afib  . Basal cell carcinoma    followed by dermatology  . H/O colonoscopy    2009, polypoid colorectal mucosa found, Dr. Oletta Lamas advised repeat in 3 years  . Hiatal hernia    upper GI in 2003  . History of nuclear stress test 2005   abnormal, cardiac cath done   . Hypertension   . Kidney stones    Past Surgical History:  Procedure Laterality Date  . CARDIAC CATHETERIZATION  2005   no evidence of significamt atherosclerosis  . HERNIA REPAIR    . KNEE SURGERY    . SHOULDER SURGERY     No Known Allergies   Medications: Losartan 25 mg daily Flonase 2 sprays daily as needed 81 mg aspirin daily Ranitidine  150 mg twice daily  PRN for kidney stones: Ibuprofen p.m. 2 daily, hydrocodone 5/325, Flomax, ondansetron 4 mg  Social History: Married, never smoker.  EtOH 3-4 times per week (1 or 2 glasses).  Family History  Problem Relation Age of Onset  . Heart attack Mother   . Liver cancer Father   . Multiple sclerosis Sister     ROS: Currently denies lightheadedness, dizziness, Fever, chills, CP, SOB.   No personal history of DVT, PE, MI, or CVA. No loose teeth or dentures All other systems have been reviewed and were otherwise currently negative with the exception of those mentioned in the HPI and as above.  Objective: Vitals: Ht: 6 feet 4 inches wt: 228 temp: 98 BP: 118/76 pulse: 80 O2 97 % on room air. Physical Exam: General: Alert, NAD.  Antalgic Gait  HEENT: EOMI, Good Neck Extension  Pulm: No increased work of breathing.  Clear B/L A/P w/o crackle or wheeze.  CV: RRR, No m/g/r appreciated  GI: soft, NT, ND Neuro: Neuro without gross focal deficit.  Sensation intact distally Skin: No lesions in the area of chief complaint MSK/Surgical Site: Left knee w/o redness or effusion.  + JLT. ROM 5-95.  5/5 strength in extension and flexion.  +EHL/FHL.  NVI.  Stable varus and valgus stress.    Imaging Review Patient has evidence of periarticular osteophytes and joint space narrowing on x-ray.  Partial and full-thickness cartilage loss on MRI.  Assessment: OA LEFT KNEE Active Problems:  Atrial fibrillation (Rosa Sanchez)   Essential hypertension, benign   Hyperlipidemia   Plan: Plan for Procedure(s): TOTAL KNEE ARTHROPLASTY  The patient history, physical exam, clinical judgement of the provider and imaging are consistent with end stage degenerative joint disease and total joint arthroplasty is deemed medically necessary. The treatment options including medical management, injection therapy, and arthroplasty were discussed at length. The risks and benefits of Procedure(s): TOTAL KNEE  ARTHROPLASTY were presented and reviewed.  The risks of nonoperative treatment, versus surgical intervention including but not limited to continued pain, aseptic loosening, stiffness, dislocation/subluxation, infection, bleeding, nerve injury, blood clots, cardiopulmonary complications, morbidity, mortality, among others were discussed. The patient verbalizes understanding and wishes to proceed with the plan.  Patient is being admitted for inpatient treatment for surgery, pain control, PT, OT, prophylactic antibiotics, VTE prophylaxis, progressive ambulation, ADL's and discharge planning.   Dental prophylaxis discussed and recommended for 2 years postoperatively.   The patient does meet the criteria for TXA which will be used perioperatively via IV.    ASA 325 mg will be used postoperatively for DVT prophylaxis in addition to SCDs, and early ambulation.  The patient is planning to be discharged home with home health services [facility] care) in care of his wife.  Severity of Illness: The appropriate patient status for this patient is OBSERVATION. Observation status is judged to be reasonable and necessary in order to provide the required intensity of service to ensure the patient's safety. The patient's presenting symptoms, physical exam findings, and initial radiographic and laboratory data in the context of their medical condition is felt to place them at decreased risk for further clinical deterioration. Furthermore, it is anticipated that the patient will be medically stable for discharge from the hospital within 2 midnights of admission.   Prudencio Burly III, PA-C 11/13/2017 8:12 AM

## 2017-11-15 ENCOUNTER — Other Ambulatory Visit (HOSPITAL_COMMUNITY): Payer: Self-pay | Admitting: *Deleted

## 2017-11-15 ENCOUNTER — Encounter (HOSPITAL_COMMUNITY): Payer: Self-pay

## 2017-11-15 NOTE — Pre-Procedure Instructions (Signed)
David Vargas  11/15/2017    Your procedure is scheduled on Tuesday, November 28, 2017 at 7:30 AM.   Report to [facility] Entrance "A" Admitting Office at 5:30 AM.   Call this number if you have problems the morning of surgery: [phone]   Questions prior to day of surgery, please call [phone] between 8 & 4 PM.   Remember:  Do not eat food or drink liquids after midnight Monday, 11/27/17.  Take these medicines the morning of surgery with A SIP OF WATER: Ranitidine (Zantac), Tamsulosin (Flomax), Flonase - if needed  Stop Aspirin and NSAIDS (Ibuprofen, Aleve, etc) 7 days prior to surgery.   Do not wear jewelry.  Do not wear lotions, powders, cologne or deodorant.  Men may shave face and neck.  Do not bring valuables to the hospital.  [facility] is not responsible for any belongings or valuables.  Contacts, dentures or bridgework may not be worn into surgery.  Leave your suitcase in the car.  After surgery it may be brought to your room.  For patients admitted to the hospital, discharge time will be determined by your treatment team. [facility] - Preparing for Surgery  Before surgery, you can play an important role.  Because skin is not sterile, your skin needs to be as free of germs as possible.  You can reduce the number of germs on you skin by washing with CHG (chlorahexidine gluconate) soap before surgery.  CHG is an antiseptic cleaner which kills germs and bonds with the skin to continue killing germs even after washing.  Please DO NOT use if you have an allergy to CHG or antibacterial soaps.  If your skin becomes reddened/irritated stop using the CHG and inform your nurse when you arrive at Short Stay.  Do not shave (including legs and underarms) for at least 48 hours prior to the first CHG shower.  You may shave your face.  Please follow these instructions carefully:   1.  Shower with CHG Soap the night before surgery and the                    morning  of Surgery.  2.  If you choose to wash your hair, wash your hair first as usual with your       normal shampoo.  3.  After you shampoo, rinse your hair and body thoroughly to remove the shampoo.  4.  Use CHG as you would any other liquid soap.  You can apply chg directly       to the skin and wash gently with scrungie or a clean washcloth.  5.  Apply the CHG Soap to your body ONLY FROM THE NECK DOWN.        Do not use on open wounds or open sores.  Avoid contact with your eyes, ears, mouth and genitals (private parts).  Wash genitals (private parts) with your normal soap.  6.  Wash thoroughly, paying special attention to the area where your surgery        will be performed.  7.  Thoroughly rinse your body with warm water from the neck down.  8.  DO NOT shower/wash with your normal soap after using and rinsing off       the CHG Soap.  9.  Pat yourself dry with a clean towel.            10.  Wear clean pajamas.  11.  Place clean sheets on your bed the night of your first shower and do not        sleep with pets.  Day of Surgery  Do not apply any lotions/deodorants the morning of surgery.  Please wear clean clothes to the hospital.   Please read over the fact sheets that you were given.

## 2017-11-17 ENCOUNTER — Encounter (HOSPITAL_COMMUNITY): Payer: Self-pay

## 2017-11-17 ENCOUNTER — Encounter (HOSPITAL_COMMUNITY)
Admission: RE | Admit: 2017-11-17 | Discharge: 2017-11-17 | Disposition: A | Discharge status: Home / Self Care | Payer: Medicare HMO | Source: Ambulatory Visit | Attending: Orthopedic Surgery | Admitting: Orthopedic Surgery

## 2017-11-17 DIAGNOSIS — Z01812 Encounter for preprocedural laboratory examination: Secondary | ICD-10-CM | POA: Insufficient documentation

## 2017-11-17 HISTORY — DX: Adverse effect of unspecified anesthetic, initial encounter: T41.45XA

## 2017-11-17 HISTORY — DX: Gastro-esophageal reflux disease without esophagitis: K21.9

## 2017-11-17 HISTORY — DX: Other complications of anesthesia, initial encounter: T88.59XA

## 2017-11-17 HISTORY — DX: Personal history of urinary calculi: Z87.442

## 2017-11-17 HISTORY — DX: Malignant (primary) neoplasm, unspecified: C80.1

## 2017-11-17 HISTORY — DX: Unspecified osteoarthritis, unspecified site: M19.90

## 2017-11-17 LAB — CBC
HEMATOCRIT: 45.1 % (ref 39.0–52.0)
Hemoglobin: 14.7 g/dL (ref 13.0–17.0)
MCH: 29.6 pg (ref 26.0–34.0)
MCHC: 32.6 g/dL (ref 30.0–36.0)
MCV: 90.9 fL (ref 78.0–100.0)
PLATELETS: 196 10*3/uL (ref 150–400)
RBC: 4.96 MIL/uL (ref 4.22–5.81)
RDW: 14.2 % (ref 11.5–15.5)
WBC: 5.2 10*3/uL (ref 4.0–10.5)

## 2017-11-17 LAB — BASIC METABOLIC PANEL
Anion gap: 7 (ref 5–15)
BUN: 24 mg/dL — AB (ref 6–20)
CO2: 26 mmol/L (ref 22–32)
CREATININE: 0.97 mg/dL (ref 0.61–1.24)
Calcium: 9.3 mg/dL (ref 8.9–10.3)
Chloride: 105 mmol/L (ref 101–111)
GFR calc Af Amer: >60 mL/min (ref >60)
GLUCOSE: 101 mg/dL — AB (ref 65–99)
Potassium: 4.2 mmol/L (ref 3.5–5.1)
SODIUM: 138 mmol/L (ref 135–145)

## 2017-11-17 LAB — SURGICAL PCR SCREEN
MRSA, PCR: NEGATIVE
STAPHYLOCOCCUS AUREUS: NEGATIVE

## 2017-11-17 NOTE — Progress Notes (Signed)
Pt has hx of nonobstructive cardiac disease and is followed by Dr. Ashok Norris. Cardiac clearance noted on 11/07/17. Pt denies any recent chest pain or sob. Pt states he is not diabetic.

## 2017-11-27 MED ORDER — TRANEXAMIC ACID 1000 MG/10ML IV SOLN
1000.0000 mg | INTRAVENOUS | Status: DC
  Filled 2017-11-27: qty 10

## 2017-11-28 ENCOUNTER — Ambulatory Visit (HOSPITAL_COMMUNITY): Payer: Medicare HMO | Admitting: Certified Registered Nurse Anesthetist

## 2017-11-28 ENCOUNTER — Ambulatory Visit (HOSPITAL_COMMUNITY)
Admission: AD | Admit: 2017-11-28 | Discharge: 2017-11-29 | Disposition: A | Discharge status: Home / Self Care | Payer: Medicare HMO | Source: Ambulatory Visit | Attending: Orthopedic Surgery | Admitting: Orthopedic Surgery

## 2017-11-28 ENCOUNTER — Encounter (HOSPITAL_COMMUNITY): Payer: Self-pay | Admitting: *Deleted

## 2017-11-28 ENCOUNTER — Inpatient Hospital Stay (HOSPITAL_COMMUNITY): Payer: Medicare HMO

## 2017-11-28 ENCOUNTER — Encounter (HOSPITAL_COMMUNITY)
Admission: AD | Disposition: A | Discharge status: Home / Self Care | Payer: Self-pay | Source: Ambulatory Visit | Attending: Orthopedic Surgery

## 2017-11-28 DIAGNOSIS — G8918 Other acute postprocedural pain: Secondary | ICD-10-CM | POA: Diagnosis not present

## 2017-11-28 DIAGNOSIS — Z7982 Long term (current) use of aspirin: Secondary | ICD-10-CM | POA: Insufficient documentation

## 2017-11-28 DIAGNOSIS — K219 Gastro-esophageal reflux disease without esophagitis: Secondary | ICD-10-CM | POA: Diagnosis not present

## 2017-11-28 DIAGNOSIS — Z471 Aftercare following joint replacement surgery: Secondary | ICD-10-CM | POA: Diagnosis not present

## 2017-11-28 DIAGNOSIS — I10 Essential (primary) hypertension: Secondary | ICD-10-CM | POA: Insufficient documentation

## 2017-11-28 DIAGNOSIS — E785 Hyperlipidemia, unspecified: Secondary | ICD-10-CM | POA: Diagnosis not present

## 2017-11-28 DIAGNOSIS — I480 Paroxysmal atrial fibrillation: Secondary | ICD-10-CM | POA: Diagnosis present

## 2017-11-28 DIAGNOSIS — Z96652 Presence of left artificial knee joint: Secondary | ICD-10-CM | POA: Diagnosis not present

## 2017-11-28 DIAGNOSIS — M1712 Unilateral primary osteoarthritis, left knee: Secondary | ICD-10-CM | POA: Insufficient documentation

## 2017-11-28 DIAGNOSIS — Z96659 Presence of unspecified artificial knee joint: Secondary | ICD-10-CM

## 2017-11-28 DIAGNOSIS — M25762 Osteophyte, left knee: Secondary | ICD-10-CM | POA: Insufficient documentation

## 2017-11-28 DIAGNOSIS — I4891 Unspecified atrial fibrillation: Secondary | ICD-10-CM | POA: Diagnosis not present

## 2017-11-28 DIAGNOSIS — Z79899 Other long term (current) drug therapy: Secondary | ICD-10-CM | POA: Diagnosis not present

## 2017-11-28 HISTORY — PX: TOTAL KNEE ARTHROPLASTY: SHX125

## 2017-11-28 SURGERY — ARTHROPLASTY, KNEE, TOTAL
Anesthesia: General | Site: Knee | Laterality: Left

## 2017-11-28 MED ORDER — CELECOXIB 200 MG PO CAPS: 200.0000 mg | ORAL_CAPSULE | Freq: Two times a day (BID) | ORAL | 0 refills | Status: DC

## 2017-11-28 MED ORDER — METOCLOPRAMIDE HCL 5 MG/ML IJ SOLN
5.0000 mg | Freq: Three times a day (TID) | INTRAMUSCULAR | Status: DC | PRN
  Administered 2017-11-28 – 2017-11-29 (×2): 10 mg via INTRAVENOUS
  Filled 2017-11-28 (×2): qty 2

## 2017-11-28 MED ORDER — DOCUSATE SODIUM 100 MG PO CAPS
100.0000 mg | ORAL_CAPSULE | Freq: Two times a day (BID) | ORAL | Status: DC
  Administered 2017-11-28 – 2017-11-29 (×2): 100 mg via ORAL
  Filled 2017-11-28 (×2): qty 1

## 2017-11-28 MED ORDER — PROPOFOL 500 MG/50ML IV EMUL
INTRAVENOUS | Status: DC | PRN
  Administered 2017-11-28: 50 ug/kg/min via INTRAVENOUS

## 2017-11-28 MED ORDER — FAMOTIDINE 20 MG PO TABS
20.0000 mg | ORAL_TABLET | Freq: Two times a day (BID) | ORAL | Status: DC
  Administered 2017-11-28 – 2017-11-29 (×2): 20 mg via ORAL
  Filled 2017-11-28 (×2): qty 1

## 2017-11-28 MED ORDER — DEXAMETHASONE SODIUM PHOSPHATE 10 MG/ML IJ SOLN
10.0000 mg | Freq: Once | INTRAMUSCULAR | Status: AC
  Administered 2017-11-29: 10 mg via INTRAVENOUS
  Filled 2017-11-28: qty 1

## 2017-11-28 MED ORDER — FENTANYL CITRATE (PF) 100 MCG/2ML IJ SOLN
INTRAMUSCULAR | Status: DC | PRN
  Administered 2017-11-28 (×3): 50 ug via INTRAVENOUS
  Administered 2017-11-28: 100 ug via INTRAVENOUS

## 2017-11-28 MED ORDER — POLYETHYLENE GLYCOL 3350 17 G PO PACK: 17.0000 g | PACK | Freq: Every day | ORAL | Status: DC | PRN

## 2017-11-28 MED ORDER — LACTATED RINGERS IV SOLN
INTRAVENOUS | Status: DC
  Administered 2017-11-28: 17:00:00 via INTRAVENOUS

## 2017-11-28 MED ORDER — HYDROMORPHONE HCL 1 MG/ML IJ SOLN
INTRAMUSCULAR | Status: AC
  Administered 2017-11-28: 0.5 mg via INTRAVENOUS
  Filled 2017-11-28: qty 1

## 2017-11-28 MED ORDER — MIDAZOLAM HCL 2 MG/2ML IJ SOLN
INTRAMUSCULAR | Status: AC
  Filled 2017-11-28: qty 2

## 2017-11-28 MED ORDER — PROPOFOL 10 MG/ML IV BOLUS
INTRAVENOUS | Status: AC
  Filled 2017-11-28: qty 20

## 2017-11-28 MED ORDER — METHOCARBAMOL 500 MG PO TABS
500.0000 mg | ORAL_TABLET | Freq: Four times a day (QID) | ORAL | Status: DC | PRN
  Administered 2017-11-28 – 2017-11-29 (×4): 500 mg via ORAL
  Filled 2017-11-28 (×4): qty 1

## 2017-11-28 MED ORDER — ONDANSETRON HCL 4 MG PO TABS: 4.0000 mg | ORAL_TABLET | Freq: Four times a day (QID) | ORAL | Status: DC | PRN

## 2017-11-28 MED ORDER — SODIUM CHLORIDE 0.9 % IR SOLN
Status: DC | PRN
  Administered 2017-11-28: 3000 mL

## 2017-11-28 MED ORDER — GABAPENTIN 300 MG PO CAPS
ORAL_CAPSULE | ORAL | Status: AC
  Filled 2017-11-28: qty 1

## 2017-11-28 MED ORDER — BUPIVACAINE HCL (PF) 0.25 % IJ SOLN
INTRAMUSCULAR | Status: DC | PRN
Start: 2017-11-28 — End: 2017-11-28
  Administered 2017-11-28: 30 mL

## 2017-11-28 MED ORDER — CELECOXIB 200 MG PO CAPS
200.0000 mg | ORAL_CAPSULE | Freq: Two times a day (BID) | ORAL | Status: DC
  Administered 2017-11-29: 200 mg via ORAL
  Filled 2017-11-28 (×2): qty 1

## 2017-11-28 MED ORDER — DIPHENHYDRAMINE HCL 12.5 MG/5ML PO ELIX: 12.5000 mg | ORAL_SOLUTION | ORAL | Status: DC | PRN

## 2017-11-28 MED ORDER — KETOROLAC TROMETHAMINE 30 MG/ML IJ SOLN
INTRAMUSCULAR | Status: AC
  Filled 2017-11-28: qty 3

## 2017-11-28 MED ORDER — ASPIRIN EC 325 MG PO TBEC
325.0000 mg | DELAYED_RELEASE_TABLET | Freq: Every day | ORAL | Status: DC
  Administered 2017-11-29: 325 mg via ORAL
  Filled 2017-11-28: qty 1

## 2017-11-28 MED ORDER — METOCLOPRAMIDE HCL 5 MG PO TABS: 5.0000 mg | ORAL_TABLET | Freq: Three times a day (TID) | ORAL | Status: DC | PRN

## 2017-11-28 MED ORDER — ACETAMINOPHEN 500 MG PO TABS
1000.0000 mg | ORAL_TABLET | Freq: Once | ORAL | Status: AC
  Administered 2017-11-28: 1000 mg via ORAL

## 2017-11-28 MED ORDER — CEFAZOLIN SODIUM-DEXTROSE 2-4 GM/100ML-% IV SOLN
INTRAVENOUS | Status: AC
  Filled 2017-11-28: qty 100

## 2017-11-28 MED ORDER — DEXAMETHASONE SODIUM PHOSPHATE 10 MG/ML IJ SOLN
INTRAMUSCULAR | Status: AC
  Filled 2017-11-28: qty 1

## 2017-11-28 MED ORDER — ACETAMINOPHEN 500 MG PO TABS: 1000.0000 mg | ORAL_TABLET | Freq: Three times a day (TID) | ORAL | 0 refills | Status: AC

## 2017-11-28 MED ORDER — ACETAMINOPHEN 500 MG PO TABS
1000.0000 mg | ORAL_TABLET | Freq: Three times a day (TID) | ORAL | Status: DC
  Administered 2017-11-28 – 2017-11-29 (×4): 1000 mg via ORAL
  Filled 2017-11-28 (×4): qty 2

## 2017-11-28 MED ORDER — PHENOL 1.4 % MT LIQD: 1.0000 | OROMUCOSAL | Status: DC | PRN

## 2017-11-28 MED ORDER — TRANEXAMIC ACID 1000 MG/10ML IV SOLN
INTRAVENOUS | Status: DC | PRN
  Administered 2017-11-28: 1000 mg via INTRAVENOUS

## 2017-11-28 MED ORDER — OXYCODONE HCL 5 MG PO TABS: 5.0000 mg | ORAL_TABLET | ORAL | Status: DC | PRN

## 2017-11-28 MED ORDER — ONDANSETRON HCL 4 MG/2ML IJ SOLN
INTRAMUSCULAR | Status: AC
  Filled 2017-11-28: qty 2

## 2017-11-28 MED ORDER — ACETAMINOPHEN 325 MG PO TABS: 650.0000 mg | ORAL_TABLET | ORAL | Status: DC | PRN

## 2017-11-28 MED ORDER — LACTATED RINGERS IV SOLN
INTRAVENOUS | Status: DC
  Administered 2017-11-28 (×2): via INTRAVENOUS

## 2017-11-28 MED ORDER — HYDROMORPHONE HCL 1 MG/ML IJ SOLN
0.2500 mg | INTRAMUSCULAR | Status: DC | PRN
  Administered 2017-11-28 (×4): 0.5 mg via INTRAVENOUS

## 2017-11-28 MED ORDER — METHOCARBAMOL 500 MG PO TABS
ORAL_TABLET | ORAL | Status: AC
  Filled 2017-11-28: qty 1

## 2017-11-28 MED ORDER — MENTHOL 3 MG MT LOZG: 1.0000 | LOZENGE | OROMUCOSAL | Status: DC | PRN

## 2017-11-28 MED ORDER — ONDANSETRON HCL 4 MG PO TABS: 4.0000 mg | ORAL_TABLET | Freq: Three times a day (TID) | ORAL | 0 refills | Status: DC | PRN

## 2017-11-28 MED ORDER — CEFAZOLIN SODIUM-DEXTROSE 1-4 GM/50ML-% IV SOLN
1.0000 g | Freq: Four times a day (QID) | INTRAVENOUS | Status: AC
  Administered 2017-11-28 – 2017-11-29 (×2): 1 g via INTRAVENOUS
  Filled 2017-11-28 (×2): qty 50

## 2017-11-28 MED ORDER — FENTANYL CITRATE (PF) 250 MCG/5ML IJ SOLN
INTRAMUSCULAR | Status: AC
Start: 2017-11-28 — End: ?
  Filled 2017-11-28: qty 5

## 2017-11-28 MED ORDER — OXYCODONE HCL 5 MG PO TABS: 5.0000 mg | ORAL_TABLET | ORAL | 0 refills | Status: AC | PRN

## 2017-11-28 MED ORDER — CEFAZOLIN SODIUM-DEXTROSE 2-4 GM/100ML-% IV SOLN
2.0000 g | INTRAVENOUS | Status: AC
  Administered 2017-11-28: 2 g via INTRAVENOUS

## 2017-11-28 MED ORDER — SORBITOL 70 % SOLN: 30.0000 mL | Freq: Every day | Status: DC | PRN

## 2017-11-28 MED ORDER — GABAPENTIN 300 MG PO CAPS
300.0000 mg | ORAL_CAPSULE | Freq: Once | ORAL | Status: AC
  Administered 2017-11-28: 300 mg via ORAL

## 2017-11-28 MED ORDER — OXYCODONE HCL 5 MG PO TABS
ORAL_TABLET | ORAL | Status: AC
  Filled 2017-11-28: qty 2

## 2017-11-28 MED ORDER — ACETAMINOPHEN 500 MG PO TABS
ORAL_TABLET | ORAL | Status: AC
  Filled 2017-11-28: qty 2

## 2017-11-28 MED ORDER — ACETAMINOPHEN 650 MG RE SUPP: 650.0000 mg | RECTAL | Status: DC | PRN

## 2017-11-28 MED ORDER — DEXAMETHASONE SODIUM PHOSPHATE 4 MG/ML IJ SOLN
INTRAMUSCULAR | Status: DC | PRN
  Administered 2017-11-28: 8 mg via INTRAVENOUS

## 2017-11-28 MED ORDER — PROPOFOL 1000 MG/100ML IV EMUL
INTRAVENOUS | Status: AC
  Filled 2017-11-28: qty 100

## 2017-11-28 MED ORDER — LIDOCAINE 2% (20 MG/ML) 5 ML SYRINGE
INTRAMUSCULAR | Status: AC
  Filled 2017-11-28: qty 5

## 2017-11-28 MED ORDER — PHENYLEPHRINE 40 MCG/ML (10ML) SYRINGE FOR IV PUSH (FOR BLOOD PRESSURE SUPPORT)
PREFILLED_SYRINGE | INTRAVENOUS | Status: AC
  Filled 2017-11-28: qty 20

## 2017-11-28 MED ORDER — ASPIRIN EC 325 MG PO TBEC: 325.0000 mg | DELAYED_RELEASE_TABLET | Freq: Every day | ORAL | 0 refills | Status: DC

## 2017-11-28 MED ORDER — SODIUM CHLORIDE FLUSH 0.9 % IV SOLN
INTRAVENOUS | Status: DC | PRN
Start: 2017-11-28 — End: 2017-11-28
  Administered 2017-11-28 (×3): 10 mL

## 2017-11-28 MED ORDER — DEXTROSE 5 % IV SOLN
500.0000 mg | Freq: Four times a day (QID) | INTRAVENOUS | Status: DC | PRN
  Filled 2017-11-28: qty 5

## 2017-11-28 MED ORDER — ONDANSETRON HCL 4 MG/2ML IJ SOLN
4.0000 mg | Freq: Four times a day (QID) | INTRAMUSCULAR | Status: DC | PRN
  Administered 2017-11-28 – 2017-11-29 (×2): 4 mg via INTRAVENOUS
  Filled 2017-11-28 (×2): qty 2

## 2017-11-28 MED ORDER — OMEPRAZOLE 20 MG PO CPDR: 20.0000 mg | DELAYED_RELEASE_CAPSULE | Freq: Every day | ORAL | 0 refills | Status: DC

## 2017-11-28 MED ORDER — PHENYLEPHRINE HCL 10 MG/ML IJ SOLN
INTRAMUSCULAR | Status: DC | PRN
  Administered 2017-11-28 (×10): 40 ug via INTRAVENOUS

## 2017-11-28 MED ORDER — KETOROLAC TROMETHAMINE 30 MG/ML IJ SOLN
INTRAMUSCULAR | Status: DC | PRN
  Administered 2017-11-28: 30 mg via INTRA_ARTICULAR

## 2017-11-28 MED ORDER — ONDANSETRON HCL 4 MG/2ML IJ SOLN
INTRAMUSCULAR | Status: DC | PRN
  Administered 2017-11-28: 4 mg via INTRAVENOUS

## 2017-11-28 MED ORDER — CHLORHEXIDINE GLUCONATE 4 % EX LIQD: 60.0000 mL | Freq: Once | CUTANEOUS | Status: DC

## 2017-11-28 MED ORDER — OXYCODONE HCL 5 MG PO TABS
10.0000 mg | ORAL_TABLET | ORAL | Status: DC | PRN
  Administered 2017-11-28 – 2017-11-29 (×6): 10 mg via ORAL
  Filled 2017-11-28 (×5): qty 2

## 2017-11-28 MED ORDER — BUPIVACAINE HCL (PF) 0.25 % IJ SOLN
INTRAMUSCULAR | Status: AC
  Filled 2017-11-28: qty 30

## 2017-11-28 MED ORDER — ALBUMIN HUMAN 5 % IV SOLN
INTRAVENOUS | Status: DC | PRN
  Administered 2017-11-28: 08:00:00 via INTRAVENOUS

## 2017-11-28 MED ORDER — ROCURONIUM BROMIDE 10 MG/ML (PF) SYRINGE
PREFILLED_SYRINGE | INTRAVENOUS | Status: AC
  Filled 2017-11-28: qty 5

## 2017-11-28 MED ORDER — TAMSULOSIN HCL 0.4 MG PO CAPS: 0.4000 mg | ORAL_CAPSULE | Freq: Every day | ORAL | Status: DC | PRN

## 2017-11-28 MED ORDER — BACLOFEN 10 MG PO TABS: 10.0000 mg | ORAL_TABLET | Freq: Three times a day (TID) | ORAL | 0 refills | Status: DC | PRN

## 2017-11-28 MED ORDER — 0.9 % SODIUM CHLORIDE (POUR BTL) OPTIME
TOPICAL | Status: DC | PRN
  Administered 2017-11-28: 1000 mL

## 2017-11-28 MED ORDER — LOSARTAN POTASSIUM 25 MG PO TABS
25.0000 mg | ORAL_TABLET | Freq: Every day | ORAL | Status: DC
  Administered 2017-11-29: 25 mg via ORAL
  Filled 2017-11-28 (×2): qty 1

## 2017-11-28 MED ORDER — HYDROMORPHONE HCL 1 MG/ML IJ SOLN
1.0000 mg | INTRAMUSCULAR | Status: DC | PRN
  Administered 2017-11-28 (×2): 1 mg via INTRAVENOUS
  Filled 2017-11-28: qty 1

## 2017-11-28 MED ORDER — MIDAZOLAM HCL 5 MG/5ML IJ SOLN
INTRAMUSCULAR | Status: DC | PRN
  Administered 2017-11-28 (×2): 1 mg via INTRAVENOUS

## 2017-11-28 MED ORDER — SENNA 8.6 MG PO TABS
1.0000 | ORAL_TABLET | Freq: Two times a day (BID) | ORAL | Status: DC
  Administered 2017-11-28 – 2017-11-29 (×2): 8.6 mg via ORAL
  Filled 2017-11-28 (×2): qty 1

## 2017-11-28 SURGICAL SUPPLY — 52 items
BANDAGE ESMARK 6X9 LF (GAUZE/BANDAGES/DRESSINGS) ×1 IMPLANT
BLADE SAG 18X100X1.27 (BLADE) ×4 IMPLANT
BNDG ELASTIC 6X10 VLCR STRL LF (GAUZE/BANDAGES/DRESSINGS) ×2 IMPLANT
BNDG ELASTIC 6X15 VLCR STRL LF (GAUZE/BANDAGES/DRESSINGS) ×2 IMPLANT
BNDG ESMARK 6X9 LF (GAUZE/BANDAGES/DRESSINGS) ×2
BOWL SMART MIX CTS (DISPOSABLE) ×2 IMPLANT
CAPT KNEE TRIATH TK-4 ×2 IMPLANT
CEMENT BONE SIMPLEX SPEEDSET (Cement) IMPLANT
CLSR STERI-STRIP ANTIMIC 1/2X4 (GAUZE/BANDAGES/DRESSINGS) ×4 IMPLANT
COVER SURGICAL LIGHT HANDLE (MISCELLANEOUS) ×2 IMPLANT
CUFF TOURNIQUET SINGLE 34IN LL (TOURNIQUET CUFF) ×2 IMPLANT
DRAPE EXTREMITY T 121X128X90 (DRAPE) ×2 IMPLANT
DRAPE HALF SHEET 40X57 (DRAPES) ×2 IMPLANT
DRAPE U-SHAPE 47X51 STRL (DRAPES) ×2 IMPLANT
DRSG MEPILEX BORDER 4X12 (GAUZE/BANDAGES/DRESSINGS) ×2 IMPLANT
DRSG MEPILEX BORDER 4X8 (GAUZE/BANDAGES/DRESSINGS) ×2 IMPLANT
DURAPREP 26ML APPLICATOR (WOUND CARE) ×4 IMPLANT
ELECT CAUTERY BLADE 6.4 (BLADE) ×2 IMPLANT
ELECT REM PT RETURN 9FT ADLT (ELECTROSURGICAL) ×2
ELECTRODE REM PT RTRN 9FT ADLT (ELECTROSURGICAL) ×1 IMPLANT
FACESHIELD WRAPAROUND (MASK) ×4 IMPLANT
GLOVE BIO SURGEON STRL SZ7.5 (GLOVE) ×4 IMPLANT
GLOVE BIOGEL PI IND STRL 8 (GLOVE) ×2 IMPLANT
GLOVE BIOGEL PI INDICATOR 8 (GLOVE) ×2
GOWN STRL REUS W/ TWL LRG LVL3 (GOWN DISPOSABLE) ×2 IMPLANT
GOWN STRL REUS W/ TWL XL LVL3 (GOWN DISPOSABLE) ×1 IMPLANT
GOWN STRL REUS W/TWL LRG LVL3 (GOWN DISPOSABLE) ×2
GOWN STRL REUS W/TWL XL LVL3 (GOWN DISPOSABLE) ×1
HANDPIECE INTERPULSE COAX TIP (DISPOSABLE) ×1
IMMOBILIZER KNEE 22 UNIV (SOFTGOODS) ×2 IMPLANT
KIT BASIN OR (CUSTOM PROCEDURE TRAY) ×2 IMPLANT
KIT ROOM TURNOVER OR (KITS) ×2 IMPLANT
MANIFOLD NEPTUNE II (INSTRUMENTS) ×2 IMPLANT
NEEDLE 22X1 1/2 (OR ONLY) (NEEDLE) ×2 IMPLANT
NS IRRIG 1000ML POUR BTL (IV SOLUTION) ×2 IMPLANT
PACK TOTAL JOINT (CUSTOM PROCEDURE TRAY) ×2 IMPLANT
PAD ARMBOARD 7.5X6 YLW CONV (MISCELLANEOUS) ×2 IMPLANT
SET HNDPC FAN SPRY TIP SCT (DISPOSABLE) ×1 IMPLANT
SUCTION FRAZIER HANDLE 10FR (MISCELLANEOUS)
SUCTION TUBE FRAZIER 10FR DISP (MISCELLANEOUS) IMPLANT
SUT MNCRL AB 4-0 PS2 18 (SUTURE) ×2 IMPLANT
SUT MON AB 2-0 CT1 27 (SUTURE) ×2 IMPLANT
SUT MON AB 2-0 CT1 36 (SUTURE) ×2 IMPLANT
SUT VIC AB 0 CT1 27 (SUTURE) ×1
SUT VIC AB 0 CT1 27XBRD ANBCTR (SUTURE) ×1 IMPLANT
SUT VIC AB 1 CT1 27 (SUTURE) ×2
SUT VIC AB 1 CT1 27XBRD ANBCTR (SUTURE) ×2 IMPLANT
SYR 50ML LL SCALE MARK (SYRINGE) ×2 IMPLANT
TOWEL OR 17X24 6PK STRL BLUE (TOWEL DISPOSABLE) ×2 IMPLANT
TOWEL OR 17X26 10 PK STRL BLUE (TOWEL DISPOSABLE) ×2 IMPLANT
TRAY CATH 16FR W/PLASTIC CATH (SET/KITS/TRAYS/PACK) ×2 IMPLANT
TRAY FOLEY BAG SILVER LF 14FR (CATHETERS) IMPLANT

## 2017-11-28 NOTE — Progress Notes (Signed)
Orthopedic Tech Progress Note Patient Details:  David Vargas 1951-09-16 [id number]  CPM Left Knee CPM Left Knee: On Left Knee Flexion (Degrees): 90 Left Knee Extension (Degrees): 0  Post Interventions Patient Tolerated: Well Instructions Provided: Care of device  Hildred Priest 11/28/2017, 10:31 AM ohf not applied because there are no additional frames for the new hospital beds at this time; RN notified

## 2017-11-28 NOTE — Anesthesia Postprocedure Evaluation (Signed)
Anesthesia Post Note  Patient: David Vargas  Procedure(s) Performed: TOTAL KNEE ARTHROPLASTY (Left Knee)     Patient location during evaluation: PACU Anesthesia Type: General Level of consciousness: awake Pain management: pain level controlled Vital Signs Assessment: post-procedure vital signs reviewed and stable Respiratory status: spontaneous breathing Cardiovascular status: stable Anesthetic complications: no    Last Vitals:  Vitals:   11/28/17 1615 11/28/17 1652  BP: (!) 105/54 120/72  Pulse: 75 80  Resp: 13 18  Temp: 36.5 C 36.5 C  SpO2: 92% 95%    Last Pain:  Vitals:   11/28/17 1742  TempSrc:   PainSc: 8                  Urho Rio

## 2017-11-28 NOTE — Discharge Instructions (Signed)

## 2017-11-28 NOTE — Interval H&P Note (Signed)
History and Physical Interval Note:  11/28/2017 7:02 AM  David Vargas  has presented today for surgery, with the diagnosis of OA LEFT KNEE  The various methods of treatment have been discussed with the patient and family. After consideration of risks, benefits and other options for treatment, the patient has consented to  Procedure(s): TOTAL KNEE ARTHROPLASTY (Left) as a surgical intervention .  The patient's history has been reviewed, patient examined, no change in status, stable for surgery.  I have reviewed the patient's chart and labs.  Questions were answered to the patient's satisfaction.     Giulio Bertino D

## 2017-11-28 NOTE — Op Note (Signed)
DATE OF SURGERY:  11/28/2017 TIME: 10:14 AM  PATIENT NAME:  David Vargas   AGE: 66 y.o.    PRE-OPERATIVE DIAGNOSIS:  OA LEFT KNEE  POST-OPERATIVE DIAGNOSIS:  Same  PROCEDURE:  Procedure(s): TOTAL KNEE ARTHROPLASTY   SURGEON:  Dametria Tuzzolino D, MD   ASSISTANT:  Roxan Hockey, PA-C, he was present and scrubbed throughout the case, critical for completion in a timely fashion, and for retraction, instrumentation, and closure.    OPERATIVE IMPLANTS: Stryker Triathlon Posterior Stabilized. Press fit knee  Femur size 6, Tibia size 7, Patella size 32 3-peg oval button, with a 9 mm polyethylene insert.   PREOPERATIVE INDICATIONS:  David Vargas is a 66 y.o. year old male with end stage bone on bone degenerative arthritis of the knee who failed conservative treatment, including injections, antiinflammatories, activity modification, and assistive devices, and had significant impairment of their activities of daily living, and elected for Total Knee Arthroplasty.   The risks, benefits, and alternatives were discussed at length including but not limited to the risks of infection, bleeding, nerve injury, stiffness, blood clots, the need for revision surgery, cardiopulmonary complications, among others, and they were willing to proceed.   OPERATIVE DESCRIPTION:  The patient was brought to the operative room and placed in a supine position.  General anesthesia was administered.  IV antibiotics were given.  The lower extremity was prepped and draped in the usual sterile fashion.  Time out was performed.  The leg was elevated and exsanguinated and the tourniquet was inflated.  Anterior approach was performed.  The patella was everted and osteophytes were removed.  The anterior horn of the medial and lateral meniscus was removed.   The distal femur was opened with the drill and the intramedullary distal femoral cutting jig was utilized, set at 5 degrees resecting 10 mm off the distal femur.   Care was taken to protect the collateral ligaments.  The distal femoral sizing jig was applied, taking care to avoid notching.  Then the 4-in-1 cutting jig was applied and the anterior and posterior femur was cut, along with the chamfer cuts.  All posterior osteophytes were removed.  The flexion gap was then measured and was symmetric with the extension gap.  Then the extramedullary tibial cutting jig was utilized making the appropriate cut using the anterior tibial crest as a reference building in appropriate posterior slope.  Care was taken during the cut to protect the medial and collateral ligaments.  The proximal tibia was removed along with the posterior horns of the menisci.  The PCL was sacrificed.    The extensor gap was measured and was approximately 20mm.    I completed the distal femoral preparation using the appropriate jig to prepare the box.  The patella was then measured, and cut with the saw.    The proximal tibia sized and prepared accordingly with the reamer and the punch, and then all components were trialed with the above sized poly insert.  The knee was found to have excellent balance and full motion.    The above named components were then impacted into place and Poly tibial piece and patella were inserted.  I was very happy with his stability and ROM  I performed a periarticular injection with marcaine and toradol  The knee was easily taken through a range of motion and the patella tracked well and the knee irrigated copiously and the parapatellar and subcutaneous tissue closed with vicryl, and monocryl with steri strips for the skin.  The incision was dressed with sterile gauze and the tourniquet released and the patient was awakened and returned to the PACU in stable and satisfactory condition.  There were no complications.  Total tourniquet time was roughly 75 minutes.   POSTOPERATIVE PLAN: post op Abx, DVT px: SCD's, TED's, Early ambulation and chemical  px

## 2017-11-28 NOTE — Progress Notes (Signed)
Pt arrived to pacu with iv infusing from or 

## 2017-11-28 NOTE — Transfer of Care (Signed)
Immediate Anesthesia Transfer of Care Note  Patient: David Vargas  Procedure(s) Performed: TOTAL KNEE ARTHROPLASTY (Left Knee)  Patient Location: PACU  Anesthesia Type:Spinal  Level of Consciousness: awake, alert , oriented, patient cooperative and responds to stimulation  Airway & Oxygen Therapy: Patient Spontanous Breathing and Patient connected to face mask oxygen  Post-op Assessment: Report given to RN and Post -op Vital signs reviewed and stable  Post vital signs: Reviewed  Last Vitals:  Vitals:   11/28/17 0548  BP: 109/77  Pulse: 72  Resp: 18  Temp: (!) 36.3 C  SpO2: 96%    Last Pain:  Vitals:   11/28/17 0555  TempSrc:   PainSc: 0-No pain      Patients Stated Pain Goal: 2 (89/16/94 5038)  Complications: No apparent anesthesia complications

## 2017-11-28 NOTE — Progress Notes (Signed)
Report given to jada rn as caregiver 

## 2017-11-28 NOTE — Evaluation (Signed)
Physical Therapy Evaluation Patient Details Name: David Vargas MRN: [id number] DOB: 02-03-1951 Today's Date: 11/28/2017   History of Present Illness  Pt is a 66 y.o. male now s/p elective L TKA on 11/28/17. Pertinent PMH includes HTN, GERD, arthritis.  Clinical Impression  Pt presents with pain and an overall decrease in functional mobility secondary to above. PTA, pt indep and lives at home with wife who will be available for 24/7 support. Educ on precautions, positioning, and importance of mobility. Today, pt able to amb with RW and supervision; further mobility limited by nausea/vomiting. Despite this, pt moving well overall. Pt would benefit from continued acute PT services to maximize functional mobility and independence prior to d/c with HHPT.     Follow Up Recommendations Home health PT    Equipment Recommendations  None recommended by PT(owns DME)    Recommendations for Other Services OT consult     Precautions / Restrictions Precautions Precautions: Knee;Fall Precaution Comments: Verbally reviewed precautions Restrictions Weight Bearing Restrictions: Yes LLE Weight Bearing: Weight bearing as tolerated      Mobility  Bed Mobility Overal bed mobility: Needs Assistance Bed Mobility: Supine to Sit     Supine to sit: Supervision     General bed mobility comments: Increased nausea with sitting  Transfers Overall transfer level: Needs assistance Equipment used: Rolling walker (2 wheeled) Transfers: Sit to/from Stand Sit to Stand: Min guard         General transfer comment: Pt able to stand on 3rd attempt, reliant on momentum to power up; no physical assist required. Educ on technique with RW  Ambulation/Gait Ambulation/Gait assistance: Supervision Ambulation Distance (Feet): 15 Feet Assistive device: Rolling walker (2 wheeled) Gait Pattern/deviations: Step-through pattern;Decreased weight shift to left;Antalgic Gait velocity: Decreased Gait velocity  interpretation: <1.8 ft/sec, indicative of risk for recurrent falls General Gait Details: Slow, controlled amb in room with RW and supervision for safety. Further mobility limited by nausea/vomiting  Stairs            Wheelchair Mobility    Modified Rankin (Stroke Patients Only)       Balance Overall balance assessment: Needs assistance   Sitting balance-Leahy Scale: Good       Standing balance-Leahy Scale: Fair Standing balance comment: Able to static stand with no UE support                             Pertinent Vitals/Pain Pain Assessment: 0-10 Pain Score: 7  Pain Descriptors / Indicators: Sore;Discomfort;Guarding Pain Intervention(s): Limited activity within patient's tolerance;Monitored during session    [place] expects to be discharged to:: Private residence Living Arrangements: Spouse/significant other Available Help at Discharge: Family;Available 24 hours/day Type of Home: House Home Access: Stairs to enter Entrance Stairs-Rails: None Entrance Stairs-Number of Steps: 2 Home Layout: Multi-level;Able to live on main level with bedroom/bathroom Home Equipment: Gilford Rile - 2 wheels;Bedside commode      Prior Function Level of Independence: Independent               Hand Dominance        Extremity/Trunk Assessment   Upper Extremity Assessment Upper Extremity Assessment: Overall WFL for tasks assessed    Lower Extremity Assessment Lower Extremity Assessment: LLE deficits/detail LLE Deficits / Details: L hip flexion 3/5, knee ext/flex 3/5 LLE: Unable to fully assess due to pain    Cervical / Trunk Assessment Cervical / Trunk Assessment: Normal  Communication   Communication: No  difficulties  Cognition Arousal/Alertness: Awake/alert Behavior During Therapy: WFL for tasks assessed/performed Overall Cognitive Status: Within Functional Limits for tasks assessed                                         General Comments General comments (skin integrity, edema, etc.): Wife present at end of session    Exercises     Assessment/Plan    PT Assessment Patient needs continued PT services  PT Problem List Decreased strength;Decreased range of motion;Decreased activity tolerance;Decreased balance;Decreased mobility;Decreased knowledge of use of DME;Pain       PT Treatment Interventions DME instruction;Gait training;Stair training;Functional mobility training;Therapeutic activities;Therapeutic exercise;Balance training;Patient/family education    PT Goals (Current goals can be found in the Care Plan section)  Acute Rehab PT Goals Patient Stated Goal: Get back to playing golf asap PT Goal Formulation: With patient Time For Goal Achievement: 12/12/17 Potential to Achieve Goals: Good    Frequency 7X/week   Barriers to discharge        Co-evaluation               AM-PAC PT "6 Clicks" Daily Activity  Outcome Measure Difficulty turning over in bed (including adjusting bedclothes, sheets and blankets)?: None Difficulty moving from lying on back to sitting on the side of the bed? : None Difficulty sitting down on and standing up from a chair with arms (e.g., wheelchair, bedside commode, etc,.)?: A Little Help needed moving to and from a bed to chair (including a wheelchair)?: A Little Help needed walking in hospital room?: A Little Help needed climbing 3-5 steps with a railing? : A Little 6 Click Score: 20    End of Session Equipment Utilized During Treatment: Gait belt Activity Tolerance: Patient tolerated treatment well;Other (comment)(Patient limited by nausea) Patient left: in chair;with call bell/phone within reach;with family/visitor present Nurse Communication: Mobility status PT Visit Diagnosis: Other abnormalities of gait and mobility (R26.89);Pain Pain - Right/Left: Left Pain - part of body: Knee    Time: 7416-3845 PT Time Calculation (min) (ACUTE ONLY): 27  min   Charges:   PT Evaluation $PT Eval Low Complexity: 1 Low PT Treatments $Gait Training: 8-22 mins   PT G Codes:       Mabeline Caras, PT, DPT Acute Rehab Services  Pager: [place] 11/28/2017, 5:31 PM

## 2017-11-28 NOTE — Anesthesia Procedure Notes (Signed)
Spinal  Start time: 11/28/2017 7:30 AM Staffing Anesthesiologist: Belinda Block, MD Performed: anesthesiologist  Preanesthetic Checklist Completed: patient identified, site marked, surgical consent, pre-op evaluation, timeout performed, IV checked, risks and benefits discussed and monitors and equipment checked Spinal Block Patient position: sitting Prep: Betadine and site prepped and draped Patient monitoring: heart rate, blood pressure and continuous pulse ox Injection technique: single-shot Needle Needle type: Spinocan  Needle gauge: 22 G Needle length: 9 cm Additional Notes Kit date checked, tolerated well, negative heme, paresthesia.

## 2017-11-28 NOTE — Anesthesia Preprocedure Evaluation (Addendum)
Anesthesia Evaluation  Patient identified by MRN, date of birth, ID band Patient awake    Reviewed: Allergy & Precautions, NPO status , Patient's Chart, lab work & pertinent test results  Airway Mallampati: II  TM Distance: <3 FB     Dental  (+) Dental Advisory Given   Pulmonary    Pulmonary exam normal breath sounds clear to auscultation       Cardiovascular hypertension,  Rhythm:Regular Rate:Normal     Neuro/Psych    GI/Hepatic Neg liver ROS, hiatal hernia, GERD  ,  Endo/Other  negative endocrine ROS  Renal/GU negative Renal ROS     Musculoskeletal   Abdominal   Peds  Hematology   Anesthesia Other Findings   Reproductive/Obstetrics                            Anesthesia Physical Anesthesia Plan  ASA: III  Anesthesia Plan: Spinal   Post-op Pain Management:  Regional for Post-op pain   Induction: Intravenous  PONV Risk Score and Plan: 2 and Ondansetron, Dexamethasone, Midazolam and Treatment may vary due to age or medical condition  Airway Management Planned: Simple Face Mask  Additional Equipment:   Intra-op Plan:   Post-operative Plan:   Informed Consent: I have reviewed the patients History and Physical, chart, labs and discussed the procedure including the risks, benefits and alternatives for the proposed anesthesia with the patient or authorized representative who has indicated his/her understanding and acceptance.   Dental advisory given  Plan Discussed with: CRNA and Anesthesiologist  Anesthesia Plan Comments:        Anesthesia Quick Evaluation

## 2017-11-28 NOTE — Anesthesia Procedure Notes (Addendum)
Anesthesia Regional Block: Adductor canal block   Pre-Anesthetic Checklist: ,, timeout performed, Correct Patient, Correct Site, Correct Laterality, Correct Procedure, Correct Position, site marked, Risks and benefits discussed,  Surgical consent,  Pre-op evaluation,  At surgeon's request and post-op pain management  Laterality: Left  Prep: chloraprep       Needles:   Needle Type: Stimulator Needle - 80          Additional Needles:   Procedures: Doppler guided,,,, ultrasound used (permanent image in chart),,,,  Narrative:  Start time: 11/28/2017 6:50 AM End time: 11/28/2017 7:05 AM Injection made incrementally with aspirations every 5 mL.  Performed by: Personally  Anesthesiologist: Belinda Block, MD

## 2017-11-28 NOTE — Anesthesia Procedure Notes (Signed)
Spinal  Patient location during procedure: OR Start time: 11/28/2017 7:25 AM End time: 11/28/2017 7:40 AM Staffing Anesthesiologist: Belinda Block, MD Performed: anesthesiologist  Preanesthetic Checklist Completed: patient identified, site marked, surgical consent, pre-op evaluation, timeout performed, IV checked, risks and benefits discussed and monitors and equipment checked Spinal Block Patient position: sitting Prep: DuraPrep Patient monitoring: heart rate, cardiac monitor, continuous pulse ox and blood pressure Approach: midline Location: L3-4 Injection technique: single-shot Needle Needle type: Quincke  Needle gauge: 22 G Assessment Sensory level: T10 Additional Notes Clear CSF. Neg heme. Spinal marcaine, exp checked. 13mg  Patient tolerated procedure well. CG

## 2017-11-29 ENCOUNTER — Encounter (HOSPITAL_COMMUNITY): Payer: Self-pay | Admitting: Orthopedic Surgery

## 2017-11-29 DIAGNOSIS — M1712 Unilateral primary osteoarthritis, left knee: Secondary | ICD-10-CM | POA: Diagnosis not present

## 2017-11-29 DIAGNOSIS — Z96652 Presence of left artificial knee joint: Secondary | ICD-10-CM | POA: Diagnosis not present

## 2017-11-29 MED ORDER — TAMSULOSIN HCL 0.4 MG PO CAPS
0.4000 mg | ORAL_CAPSULE | Freq: Every day | ORAL | Status: DC
  Administered 2017-11-29: 0.4 mg via ORAL
  Filled 2017-11-29: qty 1

## 2017-11-29 MED ORDER — DOCUSATE SODIUM 100 MG PO CAPS: 100.0000 mg | ORAL_CAPSULE | Freq: Two times a day (BID) | ORAL | 0 refills | Status: DC

## 2017-11-29 NOTE — Discharge Summary (Signed)
Discharge Summary  Patient ID: David Vargas MRN: [id number] DOB/AGE: 1951-01-27 66 y.o.  Admit date: 11/28/2017 Discharge date: 11/29/2017  Admission Diagnoses:  Primary osteoarthritis of left knee  Discharge Diagnoses:  Principal Problem:   Primary osteoarthritis of left knee Active Problems:   Atrial fibrillation (HCC)   Essential hypertension, benign   Hyperlipidemia   Past Medical History:  Diagnosis Date  . Arrhythmia    paroxysmal afib  . Arthritis   . Basal cell carcinoma    followed by dermatology  . Cancer ([place])    melanoma - left shoulder  . Complication of anesthesia    slow to wake up  . GERD (gastroesophageal reflux disease)   . H/O colonoscopy    2009, polypoid colorectal mucosa found, Dr. Oletta Lamas advised repeat in 3 years  . Hiatal hernia    upper GI in 2003  . History of kidney stones   . History of nuclear stress test 2005   abnormal, cardiac cath done   . Hypertension     Surgeries: Procedure(s): TOTAL KNEE ARTHROPLASTY on 11/28/2017   Consultants (if any):   Discharged Condition: Improved  Hospital Course: David Vargas is an 66 y.o. male who was admitted 11/28/2017 with a diagnosis of Primary osteoarthritis of left knee and went to the operating room on 11/28/2017 and underwent the above named procedures.    He was given perioperative antibiotics:  Anti-infectives (From admission, onward)   Start     Dose/Rate Route Frequency Ordered Stop   11/28/17 1800  ceFAZolin (ANCEF) IVPB 1 g/50 mL premix     1 g 100 mL/hr over 30 Minutes Intravenous Every 6 hours 11/28/17 1643 11/29/17 0238   11/28/17 0550  ceFAZolin (ANCEF) 2-4 GM/100ML-% IVPB    Comments:  Scronce, Trina   : cabinet override      11/28/17 0550 11/28/17 0730   11/28/17 0545  ceFAZolin (ANCEF) IVPB 2g/100 mL premix     2 g 200 mL/hr over 30 Minutes Intravenous On call to O.R. 11/28/17 0545 11/28/17 0730    .  He was given sequential compression devices, early ambulation, and  Aspirin for DVT prophylaxis.  He benefited maximally from the hospital stay and there were no complications.    Recent vital signs:  Vitals:   11/28/17 2029 11/29/17 0434  BP: 115/68 110/64  Pulse: 69 65  Resp: 17 17  Temp: 97.6 F (36.4 C) 97.7 F (36.5 C)  SpO2: 93% 93%    Recent laboratory studies:  Lab Results  Component Value Date   HGB 14.7 11/17/2017   HGB 15.3 09/09/2017   Lab Results  Component Value Date   WBC 5.2 11/17/2017   PLT 196 11/17/2017   No results found for: INR Lab Results  Component Value Date   NA 138 11/17/2017   K 4.2 11/17/2017   CL 105 11/17/2017   CO2 26 11/17/2017   BUN 24 (H) 11/17/2017   CREATININE 0.97 11/17/2017   GLUCOSE 101 (H) 11/17/2017    Discharge Medications:   Allergies as of 11/29/2017   No Known Allergies     Medication List    STOP taking these medications   aspirin 81 MG tablet Replaced by:  aspirin EC 325 MG tablet   HYDROcodone-acetaminophen 5-325 MG tablet Commonly known as:  NORCO/VICODIN   IBUPROFEN PM PO   ondansetron 4 MG disintegrating tablet Commonly known as:  ZOFRAN ODT     TAKE these medications   acetaminophen 500 MG tablet Commonly  known as:  TYLENOL Take 2 tablets (1,000 mg total) by mouth every 8 (eight) hours for 14 days.   aspirin EC 325 MG tablet Take 1 tablet (325 mg total) by mouth daily. For 30 days post op for DVT Prophylaxis Replaces:  aspirin 81 MG tablet   baclofen 10 MG tablet Commonly known as:  LIORESAL Take 1 tablet (10 mg total) by mouth 3 (three) times daily as needed for muscle spasms.   celecoxib 200 MG capsule Commonly known as:  CELEBREX Take 1 capsule (200 mg total) by mouth 2 (two) times daily. For 2 weeks post op.  Discontinue Ibuprofen when taking this medicine.   docusate sodium 100 MG capsule Commonly known as:  COLACE Take 1 capsule (100 mg total) by mouth 2 (two) times daily. To prevent constipation while taking pain medication.   fluticasone 50  MCG/ACT nasal spray Commonly known as:  FLONASE Place 2 sprays daily as needed into the nose for allergies.   losartan 25 MG tablet Commonly known as:  COZAAR Take 25 mg by mouth daily.   omeprazole 20 MG capsule Commonly known as:  PRILOSEC Take 1 capsule (20 mg total) by mouth daily. 30 days for gastroprotection while taking Aspirin.   ondansetron 4 MG tablet Commonly known as:  ZOFRAN Take 1 tablet (4 mg total) by mouth every 8 (eight) hours as needed for nausea or vomiting.   oxyCODONE 5 MG immediate release tablet Commonly known as:  ROXICODONE Take 1-2 tablets (5-10 mg total) by mouth every 4 (four) hours as needed for breakthrough pain.   oxymetazoline 0.05 % nasal spray Commonly known as:  AFRIN Place 1 spray daily as needed into both nostrils for congestion.   ranitidine 150 MG tablet Commonly known as:  ZANTAC Take 150 mg 2 (two) times daily by mouth.   tadalafil 10 MG tablet Commonly known as:  CIALIS Take 10 mg by mouth daily as needed for erectile dysfunction.   tamsulosin 0.4 MG Caps capsule Commonly known as:  FLOMAX Take 0.4 mg daily as needed by mouth (as needed for kidney stone flare up).       Diagnostic Studies: Dg Knee Left Port  Result Date: 11/28/2017 CLINICAL DATA:  Left total knee replacement EXAM: PORTABLE LEFT KNEE - 1-2 VIEW COMPARISON:  MRI 06/13/2017 FINDINGS: Changes of total knee replacement. Soft tissue and joint space gas noted. No hardware or bony complicating feature. IMPRESSION: Left knee replacement.  No complicating feature. Electronically Signed   By: Rolm Baptise M.D.   On: 11/28/2017 10:23    Disposition: 01-Home or Self Care    Follow-up Information    Renette Butters, MD Follow up.   Specialty:  Orthopedic Surgery Contact information: [address] [place] [address] [phone]            Signed: Prudencio Burly III PA-C 11/29/2017, 7:35 AM

## 2017-11-29 NOTE — Progress Notes (Signed)
David Vargas to be D/C'd Home per MD order.  Discussed prescriptions and follow up appointments with the patient. Prescriptions given to patient, medication list explained in detail. Pt verbalized understanding.  Allergies as of 11/29/2017   No Known Allergies     Medication List    STOP taking these medications   aspirin 81 MG tablet Replaced by:  aspirin EC 325 MG tablet   HYDROcodone-acetaminophen 5-325 MG tablet Commonly known as:  NORCO/VICODIN   IBUPROFEN PM PO   ondansetron 4 MG disintegrating tablet Commonly known as:  ZOFRAN ODT     TAKE these medications   acetaminophen 500 MG tablet Commonly known as:  TYLENOL Take 2 tablets (1,000 mg total) by mouth every 8 (eight) hours for 14 days.   aspirin EC 325 MG tablet Take 1 tablet (325 mg total) by mouth daily. For 30 days post op for DVT Prophylaxis Replaces:  aspirin 81 MG tablet   baclofen 10 MG tablet Commonly known as:  LIORESAL Take 1 tablet (10 mg total) by mouth 3 (three) times daily as needed for muscle spasms.   celecoxib 200 MG capsule Commonly known as:  CELEBREX Take 1 capsule (200 mg total) by mouth 2 (two) times daily. For 2 weeks post op.  Discontinue Ibuprofen when taking this medicine.   docusate sodium 100 MG capsule Commonly known as:  COLACE Take 1 capsule (100 mg total) by mouth 2 (two) times daily. To prevent constipation while taking pain medication.   fluticasone 50 MCG/ACT nasal spray Commonly known as:  FLONASE Place 2 sprays daily as needed into the nose for allergies.   losartan 25 MG tablet Commonly known as:  COZAAR Take 25 mg by mouth daily.   omeprazole 20 MG capsule Commonly known as:  PRILOSEC Take 1 capsule (20 mg total) by mouth daily. 30 days for gastroprotection while taking Aspirin.   ondansetron 4 MG tablet Commonly known as:  ZOFRAN Take 1 tablet (4 mg total) by mouth every 8 (eight) hours as needed for nausea or vomiting.   oxyCODONE 5 MG immediate release  tablet Commonly known as:  ROXICODONE Take 1-2 tablets (5-10 mg total) by mouth every 4 (four) hours as needed for breakthrough pain.   oxymetazoline 0.05 % nasal spray Commonly known as:  AFRIN Place 1 spray daily as needed into both nostrils for congestion.   ranitidine 150 MG tablet Commonly known as:  ZANTAC Take 150 mg 2 (two) times daily by mouth.   tadalafil 10 MG tablet Commonly known as:  CIALIS Take 10 mg by mouth daily as needed for erectile dysfunction.   tamsulosin 0.4 MG Caps capsule Commonly known as:  FLOMAX Take 0.4 mg daily as needed by mouth (as needed for kidney stone flare up).       Vitals:   11/29/17 0434 11/29/17 1304  BP: 110/64 106/65  Pulse: 65 74  Resp: 17 16  Temp: 97.7 F (36.5 C) 98.3 F (36.8 C)  SpO2: 93% 92%    Skin clean, dry and intact without evidence of skin break down, no evidence of skin tears noted. ACE wrap is located on left knee area, clean, dry, and intact. IV catheter discontinued intact. Site without signs and symptoms of complications. Dressing and pressure applied. Pt denies pain at this time. No complaints noted.  An After Visit Summary and prescriptions were printed and given to the patient. Patient escorted via [place], and D/C home via private auto.  [place] RN

## 2017-11-29 NOTE — Care Management Obs Status (Signed)
MEDICARE OBSERVATION STATUS NOTIFICATION   Patient Details  Name: ADRIENNE DELAY MRN: [id number] Date of Birth: 08-11-1951   Medicare Observation Status Notification Given:  Yes    Ninfa Meeker, RN 11/29/2017, 4:14 PM

## 2017-11-29 NOTE — Care Management Obs Status (Signed)
MEDICARE OBSERVATION STATUS NOTIFICATION   Patient Details  Name: David Vargas MRN: [id number] Date of Birth: 1951/09/18   Medicare Observation Status Notification Given:  Yes    Ninfa Meeker, RN 11/29/2017, 4:12 PM

## 2017-11-29 NOTE — Care Management Note (Signed)
Case Management Note  Patient Details  Name: David Vargas MRN: [id number] Date of Birth: 1951-07-24  Subjective/Objective:  66 yr old male s/p left total knee arthroplasty.                  Action/Plan: Patient was preoperatively setup with [facility], no changes. Has DME, will have family support at discharge.   Expected Discharge Date:  11/29/17               Expected Discharge Plan:  [place]  In-House Referral:  NA  Discharge planning Services  CM Consult  Post Acute Care Choice:  Home Health, Durable Medical Equipment Choice offered to:  Patient  DME Arranged:  CPM(has RW and 3in1) DME Agency:  [employer]  HH Arranged:  PT HH Agency:  [facility]  Status of Service:  Completed, signed off  If discussed at [place] of Stay Meetings, dates discussed:    Additional Comments:  Ninfa Meeker, RN 11/29/2017, 12:11 PM

## 2017-11-29 NOTE — Care Management CC44 (Signed)
Condition Code 44 Documentation Completed  Patient Details  Name: THIAGO RAGSDALE MRN: [id number] Date of Birth: March 18, 1951   Condition Code 44 given:  Yes Patient signature on Condition Code 44 notice:  Yes Documentation of 2 MD's agreement:  Yes Code 44 added to claim:  Yes    Ninfa Meeker, RN 11/29/2017, 4:13 PM

## 2017-11-29 NOTE — Care Management Obs Status (Signed)
MEDICARE OBSERVATION STATUS NOTIFICATION   Patient Details  Name: David Vargas MRN: [id number] Date of Birth: February 09, 1951   Medicare Observation Status Notification Given:  Yes    Ninfa Meeker, RN 11/29/2017, 4:13 PM

## 2017-11-29 NOTE — Progress Notes (Signed)
Harvie Heck, PA talked with patient and RN about urinary retention. Martensen, PA stated that flomax would be ordered and patient must be able to urinate without in and out catheter. Patient at time has no complaints about bladder pressure, pain, or urgency. Abdomen is soft, nontender, and not distended. Will continue to monitor

## 2017-11-29 NOTE — Evaluation (Signed)
Occupational Therapy Evaluation Patient Details Name: David Vargas MRN: [id number] DOB: February 10, 1951 Today's Date: 11/29/2017    History of Present Illness Pt is a 66 y.o. male now s/p elective L TKA on 11/28/17. Pertinent PMH includes HTN, GERD, arthritis.   Clinical Impression   Pt is at min A level with ADLs and min guard A with ADL mobility. Pt will have 24 hour assist from spouse and all necessary DME at home. All education completed and no further acute OT is indicated at this time    Follow Up Recommendations  DC plan and follow up therapy as arranged by surgeon;Supervision - Intermittent    Equipment Recommendations  None recommended by OT    Recommendations for Other Services       Precautions / Restrictions Precautions Precautions: Knee;Fall Precaution Booklet Issued: Yes (comment) Precaution Comments:  reviewed no pillow under knee Restrictions Weight Bearing Restrictions: Yes LLE Weight Bearing: Weight bearing as tolerated      Mobility Bed Mobility Overal bed mobility: Modified Independent Bed Mobility: Supine to Sit           General bed mobility comments: pt up with PT upon arrival  Transfers Overall transfer level: Needs assistance Equipment used: Rolling walker (2 wheeled) Transfers: Sit to/from Stand Sit to Stand: Min guard         General transfer comment: cues for safe hand placement    Balance Overall balance assessment: Needs assistance Sitting-balance support: No upper extremity supported;Feet supported;Feet unsupported Sitting balance-Leahy Scale: Good     Standing balance support: Single extremity supported;Bilateral upper extremity supported;During functional activity Standing balance-Leahy Scale: Fair Standing balance comment: Able to static stand with no UE support                           ADL either performed or assessed with clinical judgement   ADL Overall ADL's : Needs assistance/impaired Eating/Feeding:  Independent;Sitting   Grooming: Wash/dry hands;Wash/dry face;Standing;Min guard;Cueing for safety   Upper Body Bathing: Set up;Sitting   Lower Body Bathing: Minimal assistance   Upper Body Dressing : Set up;Sitting   Lower Body Dressing: Minimal assistance   Toilet Transfer: Min guard;Ambulation;RW;Comfort height toilet;Grab bars;Cueing for safety   Toileting- Clothing Manipulation and Hygiene: Min guard;Sit to/from stand   Tub/ Shower Transfer: Min guard;3 in 1;Ambulation;Rolling walker;Grab bars   Functional mobility during ADLs: Min guard;Cueing for safety;Rolling walker       Vision Baseline Vision/History: Wears glasses Patient Visual Report: No change from baseline       Perception     Praxis      Pertinent Vitals/Pain Pain Assessment: 0-10 Pain Score: 6  Faces Pain Scale: Hurts little more Pain Location: L knee and thigh Pain Descriptors / Indicators: Sore;Guarding;Grimacing Pain Intervention(s): Monitored during session;Premedicated before session;Repositioned     Hand Dominance Right   Extremity/Trunk Assessment Upper Extremity Assessment Upper Extremity Assessment: Overall WFL for tasks assessed   Lower Extremity Assessment Lower Extremity Assessment: Defer to PT evaluation   Cervical / Trunk Assessment Cervical / Trunk Assessment: Normal   Communication Communication Communication: No difficulties   Cognition Arousal/Alertness: Awake/alert Behavior During Therapy: WFL for tasks assessed/performed Overall Cognitive Status: Within Functional Limits for tasks assessed                                     General Comments   pt pleasant  and cooperative    Exercises    Shoulder Instructions      Home Living Family/patient expects to be discharged to:: Private residence Living Arrangements: Spouse/significant other Available Help at Discharge: Family;Available 24 hours/day   Home Access: Stairs to enter [employer]  of Steps: 2 Entrance Stairs-Rails: None Home Layout: Multi-level;Able to live on main level with bedroom/bathroom Alternate Level Stairs-Number of Steps: Split level: 2 steps inside (pt can live on this floor), then 7 steps to den area, another 7-8 to upstairs   Bathroom Shower/Tub: Tub/shower unit;Walk-in shower         Home Equipment: Gilford Rile - 2 wheels;Bedside commode          Prior Functioning/Environment Level of Independence: Independent                 OT Problem List: Decreased activity tolerance;Decreased knowledge of use of DME or AE;Impaired balance (sitting and/or standing);Pain      OT Treatment/Interventions:      OT Goals(Current goals can be found in the care plan section) Acute Rehab OT Goals Patient Stated Goal: Get back to playing golf asap Time For Goal Achievement: 12/06/17 Potential to Achieve Goals: Good  OT Frequency:     Barriers to D/C:    no barriers       Co-evaluation              AM-PAC PT "6 Clicks" Daily Activity     Outcome Measure Help from another person eating meals?: None Help from another person taking care of personal grooming?: A Little Help from another person toileting, which includes using toliet, bedpan, or urinal?: A Little Help from another person bathing (including washing, rinsing, drying)?: A Little Help from another person to put on and taking off regular upper body clothing?: None Help from another person to put on and taking off regular lower body clothing?: A Little 6 Click Score: 20   End of Session Equipment Utilized During Treatment: Gait belt;Rolling walker;Other (comment)(3 in 1) CPM Left Knee CPM Left Knee: Off  Activity Tolerance: Patient tolerated treatment well Patient left: in chair;with call bell/phone within reach  OT Visit Diagnosis: Unsteadiness on feet (R26.81);Other abnormalities of gait and mobility (R26.89);Pain Pain - Right/Left: Left Pain - part of body: Knee                 Time: 0981-1914 OT Time Calculation (min): 24 min Charges:  OT General Charges $OT Visit: 1 Visit OT Evaluation $OT Eval Low Complexity: 1 Low OT Treatments $Therapeutic Activity: 8-22 mins G-Codes: OT G-codes **NOT FOR INPATIENT CLASS** Functional Assessment Tool Used: AM-PAC 6 Clicks Daily Activity     Britt Bottom 11/29/2017, 1:42 PM

## 2017-11-29 NOTE — Progress Notes (Signed)
Physical Therapy Treatment Patient Details Name: David Vargas MRN: [id number] DOB: 1951/11/27 Today's Date: 11/29/2017    History of Present Illness Pt is a 66 y.o. male now s/p elective L TKA on 11/28/17. Pertinent PMH includes HTN, GERD, arthritis.    PT Comments    Patient is making progress toward mobility goals. Pt tolerated gait and stair training as well as HEP this am. Continue to progress as tolerated with anticipated d/c home with HHPT.    Follow Up Recommendations  Home health PT     Equipment Recommendations  None recommended by PT(owns DME)    Recommendations for Other Services OT consult     Precautions / Restrictions Precautions Precautions: Knee;Fall Precaution Booklet Issued: Yes (comment) Precaution Comments: Verbally reviewed precautions Restrictions Weight Bearing Restrictions: Yes LLE Weight Bearing: Weight bearing as tolerated    Mobility  Bed Mobility Overal bed mobility: Modified Independent Bed Mobility: Supine to Sit           General bed mobility comments: increased time and effort  Transfers Overall transfer level: Needs assistance Equipment used: Rolling walker (2 wheeled) Transfers: Sit to/from Stand Sit to Stand: Min guard         General transfer comment: cues for safe hand placement  Ambulation/Gait Ambulation/Gait assistance: Supervision Ambulation Distance (Feet): 200 Feet Assistive device: Rolling walker (2 wheeled) Gait Pattern/deviations: Step-through pattern;Decreased weight shift to left;Antalgic;Decreased stance time - left;Decreased step length - right;Trunk flexed Gait velocity: Decreased   General Gait Details: cues for posture, proximity of RW, and L heel strike   Stairs Stairs: Yes   Stair Management: No rails;Step to pattern;Backwards;Forwards;With walker Number of Stairs: (2 steps X2) General stair comments: stairs practiced backwards with RW and forward with HHA; cues for sequencing and  technique  Wheelchair Mobility    Modified Rankin (Stroke Patients Only)       Balance Overall balance assessment: Needs assistance   Sitting balance-Leahy Scale: Good       Standing balance-Leahy Scale: Fair Standing balance comment: Able to static stand with no UE support                            Cognition Arousal/Alertness: Awake/alert Behavior During Therapy: WFL for tasks assessed/performed Overall Cognitive Status: Within Functional Limits for tasks assessed                                        Exercises Total Joint Exercises Ankle Circles/Pumps: AROM;Both;10 reps Quad Sets: AROM;Left;10 reps Short Arc Quad: AROM;Left;10 reps Heel Slides: AROM;Left;10 reps Hip ABduction/ADduction: AROM;Left;10 reps Straight Leg Raises: AROM;Left;10 reps Goniometric ROM: approx 80 degrees flexion    General Comments        Pertinent Vitals/Pain Pain Assessment: Faces Faces Pain Scale: Hurts little more Pain Location: L knee and thigh Pain Descriptors / Indicators: Sore;Guarding;Grimacing Pain Intervention(s): Limited activity within patient's tolerance;Monitored during session;Premedicated before session;Repositioned;Ice applied    Home Living                      Prior Function            PT Goals (current goals can now be found in the care plan section) Acute Rehab PT Goals Patient Stated Goal: Get back to playing golf asap PT Goal Formulation: With patient Time For Goal Achievement: 12/12/17 Potential to Achieve  Goals: Good Progress towards PT goals: Progressing toward goals    Frequency    7X/week      PT Plan Current plan remains appropriate    Co-evaluation              AM-PAC PT "6 Clicks" Daily Activity  Outcome Measure  Difficulty turning over in bed (including adjusting bedclothes, sheets and blankets)?: None Difficulty moving from lying on back to sitting on the side of the bed? :  None Difficulty sitting down on and standing up from a chair with arms (e.g., wheelchair, bedside commode, etc,.)?: A Little Help needed moving to and from a bed to chair (including a wheelchair)?: A Little Help needed walking in hospital room?: A Little Help needed climbing 3-5 steps with a railing? : A Little 6 Click Score: 20    End of Session Equipment Utilized During Treatment: Gait belt Activity Tolerance: Patient tolerated treatment well Patient left: with call bell/phone within reach;Other (comment)(pt in room with OT end of session) Nurse Communication: Mobility status PT Visit Diagnosis: Other abnormalities of gait and mobility (R26.89);Pain Pain - Right/Left: Left Pain - part of body: Knee     Time: 9528-4132 PT Time Calculation (min) (ACUTE ONLY): 30 min  Charges:  $Gait Training: 8-22 mins $Therapeutic Exercise: 8-22 mins                    G Codes:       Earney Navy, PTA Pager: [phone]     Darliss Cheney 11/29/2017, 10:46 AM

## 2017-11-29 NOTE — Progress Notes (Signed)
   Assessment / Plan: 1 Day Post-Op  S/P Procedure(s) (LRB): TOTAL KNEE ARTHROPLASTY (Left) by Dr. Ernesta Amble. Murphy on 11/28/2017  Principal Problem:   Primary osteoarthritis of left knee Active Problems:   Atrial fibrillation (HCC)   Essential hypertension, benign   Hyperlipidemia   Primary osteoarthritis left knee status post left total knee plasty Afebrile.  Vitals normal. Tolerating some diet.  Pain controlled.  Good early mobilization. Unable to urinate last night-in and out cath performed.    Plan: Follow urination closely-will need to be urinating spontaneously before discharge. Advance diet Up with therapy Continue fluids until he continues to urinate continue fluids until urinating spontaneously. Incentive Spirometry Elevate and apply ice CPM, bone foam  Weight Bearing: Weight Bearing as Tolerated (WBAT)  Dressings: Maintain Mepilex.  Apply TED hose to the operative leg prior to discharge VTE prophylaxis: Aspirin, SCDs, ambulation Dispo: Home after therapy evaluations.  Must be urinating spontaneously.  Subjective: Patient reports pain as moderate.  Tolerating some diet.  Not urinating spontaneously.  +Flatus.  No CP, SOB.   Objective:   VITALS:   Vitals:   11/28/17 1615 11/28/17 1652 11/28/17 2029 11/29/17 0434  BP: (!) 105/54 120/72 115/68 110/64  Pulse: 75 80 69 65  Resp: 13 18 17 17   Temp: 97.7 F (36.5 C) 97.7 F (36.5 C) 97.6 F (36.4 C) 97.7 F (36.5 C)  TempSrc:  Oral Axillary Oral  SpO2: 92% 95% 93% 93%  Weight:      Height:       CBC Latest Ref Rng & Units 11/17/2017 09/09/2017  WBC 4.0 - 10.5 K/uL 5.2 12.8(H)  Hemoglobin 13.0 - 17.0 g/dL 14.7 15.3  Hematocrit 39.0 - 52.0 % 45.1 45.6  Platelets 150 - 400 K/uL 196 190   BMP Latest Ref Rng & Units 11/17/2017 09/09/2017 03/24/2012  Glucose 65 - 99 mg/dL 101(H) 113(H) 126(H)  BUN 6 - 20 mg/dL 24(H) 25(H) 20  Creatinine 0.61 - 1.24 mg/dL 0.97 1.49(H) 1.10  Sodium 135 - 145 mmol/L 138 135 139   Potassium 3.5 - 5.1 mmol/L 4.2 4.1 3.9  Chloride 101 - 111 mmol/L 105 102 103  CO2 22 - 32 mmol/L 26 22 23   Calcium 8.9 - 10.3 mg/dL 9.3 9.2 9.7   Intake/Output      12/04 0701 - 12/05 0700 12/05 0701 - 12/06 0700   P.O. 720    I.V. (mL/kg) 2687.5 (26.1)    IV Piggyback 250    Total Intake(mL/kg) 3657.5 (35.5)    Urine (mL/kg/hr) 850 (0.3)    Emesis/NG output 800    Blood 0    Total Output 1650    Net +2007.5           Physical Exam: General: NAD.   Calm, conversant.  Upright in bed Resp: No increased wob Cardio: regular rate and rhythm ABD soft Neurologically intact MSK Neurovascularly intact Sensation intact distally Intact pulses distally Dorsiflexion/Plantar flexion intact Incision: dressing C/D/I   Prudencio Burly III, PA-C 11/29/2017, 7:27 AM

## 2017-11-29 NOTE — Progress Notes (Signed)
Physical Therapy Treatment Patient Details Name: David Vargas MRN: [id number] DOB: 11-04-1951 Today's Date: 11/29/2017    History of Present Illness Pt is a 66 y.o. male now s/p elective L TKA on 11/28/17. Pertinent PMH includes HTN, GERD, arthritis.    PT Comments    Patient continues to progress well toward mobility goals. Pt demonstrated improved gait mechanics this session. Stair training completed with family and HEP handout reviewed. Current plan remains appropriate.    Follow Up Recommendations  Home health PT     Equipment Recommendations  None recommended by PT(owns DME)    Recommendations for Other Services OT consult     Precautions / Restrictions Precautions Precautions: Knee;Fall Precaution Booklet Issued: Yes (comment) Precaution Comments:  reviewed no pillow under knee Restrictions Weight Bearing Restrictions: Yes LLE Weight Bearing: Weight bearing as tolerated    Mobility  Bed Mobility Overal bed mobility: Modified Independent Bed Mobility: Supine to Sit;Sit to Supine           General bed mobility comments: increased time and effort  Transfers Overall transfer level: Needs assistance Equipment used: Rolling walker (2 wheeled) Transfers: Sit to/from Stand Sit to Stand: Min guard         General transfer comment: cues for safe hand placement  Ambulation/Gait Ambulation/Gait assistance: Supervision Ambulation Distance (Feet): 200 Feet Assistive device: Rolling walker (2 wheeled) Gait Pattern/deviations: Step-through pattern;Decreased weight shift to left;Antalgic;Decreased stance time - left;Decreased step length - right;Trunk flexed Gait velocity: Decreased   General Gait Details: cues for posture and L heel strike   Stairs Stairs: Yes   Stair Management: No rails;Step to pattern;Backwards;Forwards;With walker Number of Stairs: (2 steps X2) General stair comments: wife and son present and assisted as needed; cues for sequencing and  technique  Wheelchair Mobility    Modified Rankin (Stroke Patients Only)       Balance Overall balance assessment: Needs assistance Sitting-balance support: No upper extremity supported;Feet supported;Feet unsupported Sitting balance-Leahy Scale: Good     Standing balance support: Single extremity supported;Bilateral upper extremity supported;During functional activity Standing balance-Leahy Scale: Fair Standing balance comment: Able to static stand with no UE support                            Cognition Arousal/Alertness: Awake/alert Behavior During Therapy: WFL for tasks assessed/performed Overall Cognitive Status: Within Functional Limits for tasks assessed                                        Exercises Total Joint Exercises Ankle Circles/Pumps: AROM;Both;10 reps Quad Sets: AROM;Left;10 reps Short Arc Quad: AROM;Left;10 reps Heel Slides: AROM;Left;10 reps Hip ABduction/ADduction: AROM;Left;10 reps Straight Leg Raises: AROM;Left;10 reps Goniometric ROM: approx 80 degrees flexion    General Comments General comments (skin integrity, edema, etc.): HEP handout given and reviewed with pt and family      Pertinent Vitals/Pain Pain Assessment: Faces Pain Score: 6  Faces Pain Scale: Hurts little more Pain Location: L knee and thigh Pain Descriptors / Indicators: Sore;Guarding Pain Intervention(s): Limited activity within patient's tolerance;Monitored during session;Premedicated before session;Repositioned    Home Living Family/patient expects to be discharged to:: Private residence Living Arrangements: Spouse/significant other Available Help at Discharge: Family;Available 24 hours/day   Home Access: Stairs to enter Entrance Stairs-Rails: None Home Layout: Multi-level;Able to live on main level with bedroom/bathroom Home Equipment: Gilford Rile -  2 wheels;Bedside commode      Prior Function Level of Independence: Independent           PT Goals (current goals can now be found in the care plan section) Acute Rehab PT Goals Patient Stated Goal: Get back to playing golf asap PT Goal Formulation: With patient Time For Goal Achievement: 12/12/17 Potential to Achieve Goals: Good Progress towards PT goals: Progressing toward goals    Frequency    7X/week      PT Plan Current plan remains appropriate    Co-evaluation              AM-PAC PT "6 Clicks" Daily Activity  Outcome Measure  Difficulty turning over in bed (including adjusting bedclothes, sheets and blankets)?: None Difficulty moving from lying on back to sitting on the side of the bed? : None Difficulty sitting down on and standing up from a chair with arms (e.g., wheelchair, bedside commode, etc,.)?: A Little Help needed moving to and from a bed to chair (including a wheelchair)?: A Little Help needed walking in hospital room?: A Little Help needed climbing 3-5 steps with a railing? : A Little 6 Click Score: 20    End of Session Equipment Utilized During Treatment: Gait belt Activity Tolerance: Patient tolerated treatment well Patient left: with call bell/phone within reach;in bed;in CPM;with family/visitor present Nurse Communication: Mobility status PT Visit Diagnosis: Other abnormalities of gait and mobility (R26.89);Pain Pain - Right/Left: Left Pain - part of body: Knee     Time: 1219-7588 PT Time Calculation (min) (ACUTE ONLY): 36 min  Charges:  $Gait Training: 8-22 mins $Therapeutic Exercise: 8-22 mins $Therapeutic Activity: 8-22 mins                    G Codes:       Earney Navy, PTA Pager: [phone]     Darliss Cheney 11/29/2017, 2:23 PM

## 2017-11-30 DIAGNOSIS — I10 Essential (primary) hypertension: Secondary | ICD-10-CM | POA: Diagnosis not present

## 2017-11-30 DIAGNOSIS — Z471 Aftercare following joint replacement surgery: Secondary | ICD-10-CM | POA: Diagnosis not present

## 2017-11-30 DIAGNOSIS — I480 Paroxysmal atrial fibrillation: Secondary | ICD-10-CM | POA: Diagnosis not present

## 2017-11-30 DIAGNOSIS — Z96651 Presence of right artificial knee joint: Secondary | ICD-10-CM | POA: Diagnosis not present

## 2017-11-30 DIAGNOSIS — Z7982 Long term (current) use of aspirin: Secondary | ICD-10-CM | POA: Diagnosis not present

## 2017-12-08 DIAGNOSIS — Z7982 Long term (current) use of aspirin: Secondary | ICD-10-CM | POA: Diagnosis not present

## 2017-12-08 DIAGNOSIS — Z471 Aftercare following joint replacement surgery: Secondary | ICD-10-CM | POA: Diagnosis not present

## 2017-12-08 DIAGNOSIS — Z96651 Presence of right artificial knee joint: Secondary | ICD-10-CM | POA: Diagnosis not present

## 2017-12-08 DIAGNOSIS — I10 Essential (primary) hypertension: Secondary | ICD-10-CM | POA: Diagnosis not present

## 2017-12-08 DIAGNOSIS — I480 Paroxysmal atrial fibrillation: Secondary | ICD-10-CM | POA: Diagnosis not present

## 2017-12-11 DIAGNOSIS — Z7982 Long term (current) use of aspirin: Secondary | ICD-10-CM | POA: Diagnosis not present

## 2017-12-11 DIAGNOSIS — I10 Essential (primary) hypertension: Secondary | ICD-10-CM | POA: Diagnosis not present

## 2017-12-11 DIAGNOSIS — Z96651 Presence of right artificial knee joint: Secondary | ICD-10-CM | POA: Diagnosis not present

## 2017-12-11 DIAGNOSIS — I480 Paroxysmal atrial fibrillation: Secondary | ICD-10-CM | POA: Diagnosis not present

## 2017-12-11 DIAGNOSIS — Z471 Aftercare following joint replacement surgery: Secondary | ICD-10-CM | POA: Diagnosis not present

## 2017-12-13 DIAGNOSIS — Z471 Aftercare following joint replacement surgery: Secondary | ICD-10-CM | POA: Diagnosis not present

## 2017-12-13 DIAGNOSIS — M1712 Unilateral primary osteoarthritis, left knee: Secondary | ICD-10-CM | POA: Diagnosis not present

## 2017-12-13 DIAGNOSIS — Z96651 Presence of right artificial knee joint: Secondary | ICD-10-CM | POA: Diagnosis not present

## 2017-12-13 DIAGNOSIS — I10 Essential (primary) hypertension: Secondary | ICD-10-CM | POA: Diagnosis not present

## 2017-12-13 DIAGNOSIS — Z7982 Long term (current) use of aspirin: Secondary | ICD-10-CM | POA: Diagnosis not present

## 2017-12-13 DIAGNOSIS — I480 Paroxysmal atrial fibrillation: Secondary | ICD-10-CM | POA: Diagnosis not present

## 2017-12-14 NOTE — Progress Notes (Signed)
   11/28/17 1730  PT G-Codes **NOT FOR INPATIENT CLASS**  Functional Assessment Tool Used AM-PAC 6 Clicks Basic Mobility  Functional Limitation Mobility: Walking and moving around  Mobility: Walking and Moving Around Current Status (L4562) CJ  Mobility: Walking and Moving Around Goal Status (B6389) CI   Entered missing G-codes.  Mabeline Caras, PT, DPT Acute Rehab Services  Pager: [phone]

## 2017-12-15 DIAGNOSIS — I10 Essential (primary) hypertension: Secondary | ICD-10-CM | POA: Diagnosis not present

## 2017-12-15 DIAGNOSIS — Z96651 Presence of right artificial knee joint: Secondary | ICD-10-CM | POA: Diagnosis not present

## 2017-12-15 DIAGNOSIS — Z471 Aftercare following joint replacement surgery: Secondary | ICD-10-CM | POA: Diagnosis not present

## 2017-12-15 DIAGNOSIS — Z7982 Long term (current) use of aspirin: Secondary | ICD-10-CM | POA: Diagnosis not present

## 2017-12-15 DIAGNOSIS — I480 Paroxysmal atrial fibrillation: Secondary | ICD-10-CM | POA: Diagnosis not present

## 2017-12-21 DIAGNOSIS — R972 Elevated prostate specific antigen [PSA]: Secondary | ICD-10-CM | POA: Diagnosis not present

## 2017-12-22 DIAGNOSIS — R262 Difficulty in walking, not elsewhere classified: Secondary | ICD-10-CM | POA: Diagnosis not present

## 2017-12-22 DIAGNOSIS — M1712 Unilateral primary osteoarthritis, left knee: Secondary | ICD-10-CM | POA: Diagnosis not present

## 2017-12-22 DIAGNOSIS — M25562 Pain in left knee: Secondary | ICD-10-CM | POA: Diagnosis not present

## 2017-12-22 DIAGNOSIS — M25662 Stiffness of left knee, not elsewhere classified: Secondary | ICD-10-CM | POA: Diagnosis not present

## 2017-12-27 NOTE — Progress Notes (Signed)
OT Note - Addendum    12-19-2017 1343  OT Visit Information  Last OT Received On 2017/12/19  OT G-codes **NOT FOR INPATIENT CLASS**  Functional Assessment Tool Used Clinical judgement  Functional Limitation Self care  Self Care Current Status [phone]) CI  Self Care Goal Status (W2585) CI  Self Care Discharge Status [phone]) CI  [facility], OT/L  [phone] 19-Dec-2018

## 2017-12-28 DIAGNOSIS — M25662 Stiffness of left knee, not elsewhere classified: Secondary | ICD-10-CM | POA: Diagnosis not present

## 2017-12-28 DIAGNOSIS — R531 Weakness: Secondary | ICD-10-CM | POA: Diagnosis not present

## 2017-12-28 DIAGNOSIS — R262 Difficulty in walking, not elsewhere classified: Secondary | ICD-10-CM | POA: Diagnosis not present

## 2017-12-28 DIAGNOSIS — M25562 Pain in left knee: Secondary | ICD-10-CM | POA: Diagnosis not present

## 2018-01-01 DIAGNOSIS — M25562 Pain in left knee: Secondary | ICD-10-CM | POA: Diagnosis not present

## 2018-01-01 DIAGNOSIS — R531 Weakness: Secondary | ICD-10-CM | POA: Diagnosis not present

## 2018-01-01 DIAGNOSIS — R262 Difficulty in walking, not elsewhere classified: Secondary | ICD-10-CM | POA: Diagnosis not present

## 2018-01-01 DIAGNOSIS — M25662 Stiffness of left knee, not elsewhere classified: Secondary | ICD-10-CM | POA: Diagnosis not present

## 2018-01-02 DIAGNOSIS — J069 Acute upper respiratory infection, unspecified: Secondary | ICD-10-CM | POA: Diagnosis not present

## 2018-01-03 DIAGNOSIS — R531 Weakness: Secondary | ICD-10-CM | POA: Diagnosis not present

## 2018-01-03 DIAGNOSIS — M25662 Stiffness of left knee, not elsewhere classified: Secondary | ICD-10-CM | POA: Diagnosis not present

## 2018-01-03 DIAGNOSIS — M25562 Pain in left knee: Secondary | ICD-10-CM | POA: Diagnosis not present

## 2018-01-03 DIAGNOSIS — R262 Difficulty in walking, not elsewhere classified: Secondary | ICD-10-CM | POA: Diagnosis not present

## 2018-01-09 DIAGNOSIS — R262 Difficulty in walking, not elsewhere classified: Secondary | ICD-10-CM | POA: Diagnosis not present

## 2018-01-09 DIAGNOSIS — R531 Weakness: Secondary | ICD-10-CM | POA: Diagnosis not present

## 2018-01-09 DIAGNOSIS — M25662 Stiffness of left knee, not elsewhere classified: Secondary | ICD-10-CM | POA: Diagnosis not present

## 2018-01-09 DIAGNOSIS — M25562 Pain in left knee: Secondary | ICD-10-CM | POA: Diagnosis not present

## 2018-01-10 DIAGNOSIS — M25562 Pain in left knee: Secondary | ICD-10-CM | POA: Diagnosis not present

## 2018-01-11 DIAGNOSIS — R531 Weakness: Secondary | ICD-10-CM | POA: Diagnosis not present

## 2018-01-11 DIAGNOSIS — R262 Difficulty in walking, not elsewhere classified: Secondary | ICD-10-CM | POA: Diagnosis not present

## 2018-01-11 DIAGNOSIS — M25662 Stiffness of left knee, not elsewhere classified: Secondary | ICD-10-CM | POA: Diagnosis not present

## 2018-01-11 DIAGNOSIS — M25562 Pain in left knee: Secondary | ICD-10-CM | POA: Diagnosis not present

## 2018-01-16 DIAGNOSIS — R262 Difficulty in walking, not elsewhere classified: Secondary | ICD-10-CM | POA: Diagnosis not present

## 2018-01-16 DIAGNOSIS — M25562 Pain in left knee: Secondary | ICD-10-CM | POA: Diagnosis not present

## 2018-01-16 DIAGNOSIS — M25662 Stiffness of left knee, not elsewhere classified: Secondary | ICD-10-CM | POA: Diagnosis not present

## 2018-01-16 DIAGNOSIS — R531 Weakness: Secondary | ICD-10-CM | POA: Diagnosis not present

## 2018-01-17 DIAGNOSIS — R918 Other nonspecific abnormal finding of lung field: Secondary | ICD-10-CM | POA: Diagnosis not present

## 2018-01-17 DIAGNOSIS — E663 Overweight: Secondary | ICD-10-CM | POA: Diagnosis not present

## 2018-01-17 DIAGNOSIS — R05 Cough: Secondary | ICD-10-CM | POA: Diagnosis not present

## 2018-01-18 ENCOUNTER — Ambulatory Visit
Admission: RE | Admit: 2018-01-18 | Discharge: 2018-01-18 | Disposition: A | Discharge status: Home / Self Care | Payer: Medicare HMO | Source: Ambulatory Visit | Attending: Family Medicine | Admitting: Family Medicine

## 2018-01-18 ENCOUNTER — Other Ambulatory Visit: Payer: Self-pay | Admitting: Family Medicine

## 2018-01-18 DIAGNOSIS — R7989 Other specified abnormal findings of blood chemistry: Secondary | ICD-10-CM

## 2018-01-18 DIAGNOSIS — R0602 Shortness of breath: Secondary | ICD-10-CM | POA: Diagnosis not present

## 2018-01-18 MED ORDER — IOPAMIDOL (ISOVUE-370) INJECTION 76%
75.0000 mL | Freq: Once | INTRAVENOUS | Status: AC | PRN
  Administered 2018-01-18: 75 mL via INTRAVENOUS

## 2018-01-22 DIAGNOSIS — M25662 Stiffness of left knee, not elsewhere classified: Secondary | ICD-10-CM | POA: Diagnosis not present

## 2018-01-22 DIAGNOSIS — M25562 Pain in left knee: Secondary | ICD-10-CM | POA: Diagnosis not present

## 2018-01-22 DIAGNOSIS — R531 Weakness: Secondary | ICD-10-CM | POA: Diagnosis not present

## 2018-01-22 DIAGNOSIS — R262 Difficulty in walking, not elsewhere classified: Secondary | ICD-10-CM | POA: Diagnosis not present

## 2018-01-25 ENCOUNTER — Other Ambulatory Visit: Payer: Self-pay | Admitting: Family Medicine

## 2018-01-25 DIAGNOSIS — R911 Solitary pulmonary nodule: Secondary | ICD-10-CM

## 2018-02-15 DIAGNOSIS — R69 Illness, unspecified: Secondary | ICD-10-CM | POA: Diagnosis not present

## 2018-03-20 DIAGNOSIS — Z85828 Personal history of other malignant neoplasm of skin: Secondary | ICD-10-CM | POA: Diagnosis not present

## 2018-03-20 DIAGNOSIS — D2261 Melanocytic nevi of right upper limb, including shoulder: Secondary | ICD-10-CM | POA: Diagnosis not present

## 2018-03-20 DIAGNOSIS — D225 Melanocytic nevi of trunk: Secondary | ICD-10-CM | POA: Diagnosis not present

## 2018-03-20 DIAGNOSIS — Z85820 Personal history of malignant melanoma of skin: Secondary | ICD-10-CM | POA: Diagnosis not present

## 2018-03-20 DIAGNOSIS — L309 Dermatitis, unspecified: Secondary | ICD-10-CM | POA: Diagnosis not present

## 2018-03-20 DIAGNOSIS — D2262 Melanocytic nevi of left upper limb, including shoulder: Secondary | ICD-10-CM | POA: Diagnosis not present

## 2018-03-20 DIAGNOSIS — L570 Actinic keratosis: Secondary | ICD-10-CM | POA: Diagnosis not present

## 2018-03-20 DIAGNOSIS — D2372 Other benign neoplasm of skin of left lower limb, including hip: Secondary | ICD-10-CM | POA: Diagnosis not present

## 2018-03-20 DIAGNOSIS — D2371 Other benign neoplasm of skin of right lower limb, including hip: Secondary | ICD-10-CM | POA: Diagnosis not present

## 2018-03-20 DIAGNOSIS — D224 Melanocytic nevi of scalp and neck: Secondary | ICD-10-CM | POA: Diagnosis not present

## 2018-03-22 DIAGNOSIS — R972 Elevated prostate specific antigen [PSA]: Secondary | ICD-10-CM | POA: Diagnosis not present

## 2018-03-22 DIAGNOSIS — Z87442 Personal history of urinary calculi: Secondary | ICD-10-CM | POA: Diagnosis not present

## 2018-03-22 DIAGNOSIS — N5201 Erectile dysfunction due to arterial insufficiency: Secondary | ICD-10-CM | POA: Diagnosis not present

## 2018-04-09 ENCOUNTER — Ambulatory Visit
Admission: RE | Admit: 2018-04-09 | Discharge: 2018-04-09 | Disposition: A | Discharge status: Home / Self Care | Payer: Medicare HMO | Source: Ambulatory Visit | Attending: Family Medicine | Admitting: Family Medicine

## 2018-04-09 DIAGNOSIS — R911 Solitary pulmonary nodule: Secondary | ICD-10-CM

## 2018-04-09 DIAGNOSIS — R918 Other nonspecific abnormal finding of lung field: Secondary | ICD-10-CM | POA: Diagnosis not present

## 2018-04-12 DIAGNOSIS — R918 Other nonspecific abnormal finding of lung field: Secondary | ICD-10-CM | POA: Diagnosis not present

## 2018-04-12 DIAGNOSIS — Z86010 Personal history of colonic polyps: Secondary | ICD-10-CM | POA: Diagnosis not present

## 2018-04-12 DIAGNOSIS — N529 Male erectile dysfunction, unspecified: Secondary | ICD-10-CM | POA: Diagnosis not present

## 2018-04-12 DIAGNOSIS — I480 Paroxysmal atrial fibrillation: Secondary | ICD-10-CM | POA: Diagnosis not present

## 2018-04-12 DIAGNOSIS — Z1389 Encounter for screening for other disorder: Secondary | ICD-10-CM | POA: Diagnosis not present

## 2018-04-12 DIAGNOSIS — Z85820 Personal history of malignant melanoma of skin: Secondary | ICD-10-CM | POA: Diagnosis not present

## 2018-04-12 DIAGNOSIS — I10 Essential (primary) hypertension: Secondary | ICD-10-CM | POA: Diagnosis not present

## 2018-04-12 DIAGNOSIS — Z0000 Encounter for general adult medical examination without abnormal findings: Secondary | ICD-10-CM | POA: Diagnosis not present

## 2018-04-12 DIAGNOSIS — Z87442 Personal history of urinary calculi: Secondary | ICD-10-CM | POA: Diagnosis not present

## 2018-04-12 DIAGNOSIS — E041 Nontoxic single thyroid nodule: Secondary | ICD-10-CM | POA: Diagnosis not present

## 2018-04-12 DIAGNOSIS — R972 Elevated prostate specific antigen [PSA]: Secondary | ICD-10-CM | POA: Diagnosis not present

## 2018-04-12 DIAGNOSIS — K219 Gastro-esophageal reflux disease without esophagitis: Secondary | ICD-10-CM | POA: Diagnosis not present

## 2018-04-13 ENCOUNTER — Other Ambulatory Visit: Payer: Self-pay | Admitting: Family Medicine

## 2018-04-13 DIAGNOSIS — E041 Nontoxic single thyroid nodule: Secondary | ICD-10-CM

## 2018-04-19 ENCOUNTER — Ambulatory Visit
Admission: RE | Admit: 2018-04-19 | Discharge: 2018-04-19 | Disposition: A | Discharge status: Home / Self Care | Payer: Medicare HMO | Source: Ambulatory Visit | Attending: Family Medicine | Admitting: Family Medicine

## 2018-04-19 DIAGNOSIS — E041 Nontoxic single thyroid nodule: Secondary | ICD-10-CM | POA: Diagnosis not present

## 2018-04-26 ENCOUNTER — Other Ambulatory Visit: Payer: Self-pay | Admitting: Endocrinology

## 2018-04-26 DIAGNOSIS — E041 Nontoxic single thyroid nodule: Secondary | ICD-10-CM | POA: Diagnosis not present

## 2018-05-10 ENCOUNTER — Ambulatory Visit
Admission: RE | Admit: 2018-05-10 | Discharge: 2018-05-10 | Disposition: A | Discharge status: Home / Self Care | Payer: Medicare HMO | Source: Ambulatory Visit | Attending: Endocrinology | Admitting: Endocrinology

## 2018-05-10 ENCOUNTER — Other Ambulatory Visit (HOSPITAL_COMMUNITY)
Admission: RE | Admit: 2018-05-10 | Discharge: 2018-05-10 | Disposition: A | Discharge status: Home / Self Care | Payer: Medicare HMO | Source: Ambulatory Visit | Attending: Radiology | Admitting: Radiology

## 2018-05-10 DIAGNOSIS — E041 Nontoxic single thyroid nodule: Secondary | ICD-10-CM | POA: Insufficient documentation

## 2018-05-11 ENCOUNTER — Encounter: Payer: Self-pay | Admitting: Pulmonary Disease

## 2018-05-11 ENCOUNTER — Ambulatory Visit (INDEPENDENT_AMBULATORY_CARE_PROVIDER_SITE_OTHER): Payer: Medicare HMO | Admitting: Pulmonary Disease

## 2018-05-11 VITALS — BP 118/70 | HR 69 | Ht 76.0 in | Wt 231.0 lb

## 2018-05-11 DIAGNOSIS — J438 Other emphysema: Secondary | ICD-10-CM | POA: Diagnosis not present

## 2018-05-11 NOTE — Patient Instructions (Signed)
I have reviewed the CT scan which shows lung nodules and groundglass opacity which can be followed up with repeat imaging Please check with Dr. Rex Kras if he plans on ordering to follow-up CT.  Otherwise we can get it done from our clinic There is findings of mild emphysema.  We will get pulmonary function test for better evaluation Follow-up in 3 months.

## 2018-05-11 NOTE — Progress Notes (Signed)
David Vargas    [id number]    [birth year]  Primary Care Physician:Little, Lennette Bihari, MD  Referring Physician: Hulan Fess, MD [place], [place] [address]  Chief complaint: Consult for emphysema  HPI: 67 year old with history of melanoma, basal cell carcinoma, hypertension, allergic rhinitis osteoarthritis.  Underwent left knee replacement in December 2018.  Postop he had some chest congestion bronchitis.  CTA showed multiple pulmonary nodules in January which have remained stable on follow-up evaluation and March 2019.  There is also mild groundglass opacity in the right upper lobe and emphysema.  He is referred to pulmonary for further evaluation of emphysema.  Also noted to have a thyroid nodule and he has a thyroid ultrasound sand biopsy which was benign.  He is asymptomatic.  He is back to golfing.  Denies any dyspnea, cough, sputum production, wheezing.  History noted for left arm melanoma resected 6 years ago.  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  Smoked pipe for 1 month in college Travel history: Grew up in [place].  [place]acation [place] in the past year. Relevant family history: No significant family history of lung problems.  Outpatient Encounter Medications as of 05/11/2018  Medication Sig  . aspirin EC 325 MG tablet Take 1 tablet (325 mg total) by mouth daily. For 30 days post op for DVT Prophylaxis  . baclofen (LIORESAL) 10 MG tablet Take 1 tablet (10 mg total) by mouth 3 (three) times daily as needed for muscle spasms.  . fluticasone (FLONASE) 50 MCG/ACT nasal spray Place 2 sprays daily as needed into the nose for allergies.   Marland Kitchen HYDROcodone-acetaminophen (NORCO/VICODIN) 5-325 MG  tablet Take 1 tablet by mouth every 6 (six) hours as needed for moderate pain.  Marland Kitchen losartan (COZAAR) 25 MG tablet Take 25 mg by mouth daily.  . Melatonin 5 MG TABS Take 5 mg by mouth at bedtime.  Marland Kitchen omeprazole (PRILOSEC) 20 MG capsule Take 1 capsule (20 mg total) by mouth daily. 30 days for gastroprotection while taking Aspirin.  Marland Kitchen oxymetazoline (AFRIN) 0.05 % nasal spray Place 1 spray daily as needed into both nostrils for congestion.  . ranitidine (ZANTAC) 150 MG tablet Take 150 mg 2 (two) times daily by mouth.  . tadalafil (CIALIS) 10 MG tablet Take 5 mg by mouth daily as needed for erectile dysfunction.   . tamsulosin (FLOMAX) 0.4 MG CAPS capsule Take 0.4 mg daily as needed by mouth (as needed for kidney stone flare up).   . [DISCONTINUED] ondansetron (ZOFRAN) 4 MG tablet Take 1 tablet (4 mg total) by mouth every 8 (eight) hours as needed for nausea or vomiting.  . [DISCONTINUED] celecoxib (CELEBREX) 200 MG capsule Take 1 capsule (200 mg total) by mouth 2 (two) times daily. For 2 weeks post op.  Discontinue Ibuprofen when taking this medicine. (Patient not taking: Reported on 05/11/2018)  . [DISCONTINUED] docusate sodium (COLACE) 100 MG capsule Take 1 capsule (100 mg total) by mouth 2 (two) times daily. To prevent constipation while taking pain medication.   No facility-administered encounter medications on file as of 05/11/2018.     Allergies as of 05/11/2018  . (No Known Allergies)    Past Medical History:  Diagnosis Date  .  Arrhythmia    paroxysmal afib  . Arthritis   . Basal cell carcinoma    followed by dermatology  . Cancer ([place])    melanoma - left shoulder  . Complication of anesthesia    slow to wake up  . GERD (gastroesophageal reflux disease)   . H/O colonoscopy    2009, polypoid colorectal mucosa found, Dr. Oletta Lamas advised repeat in 3 years  . Hiatal hernia    upper GI in 2003  . History of kidney stones   . History of nuclear stress test 2005   abnormal, cardiac cath  done   . Hypertension     Past Surgical History:  Procedure Laterality Date  . CARDIAC CATHETERIZATION  2005   no evidence of significamt atherosclerosis  . HERNIA REPAIR     right groin  . KNEE SURGERY     left 1984, right 2010  Arthroscopic  . SHOULDER SURGERY Right    rotator cuff repair  . TOTAL KNEE ARTHROPLASTY Left 11/28/2017   Procedure: TOTAL KNEE ARTHROPLASTY;  Surgeon: Renette Butters, MD;  Location: [facility];  Service: Orthopedics;  Laterality: Left;    Family History  Problem Relation Age of Onset  . Heart attack Mother   . Liver cancer Father   . Multiple sclerosis Sister     Social History   Socioeconomic History  . Marital status: Married    Spouse name: Not on file  . Number of children: Not on file  . Years of education: Not on file  . Highest education level: Not on file  Occupational History  . Not on file  Social Needs  . Financial resource strain: Not on file  . Food insecurity:    Worry: Not on file    Inability: Not on file  . Transportation needs:    Medical: Not on file    Non-medical: Not on file  Tobacco Use  . Smoking status: Never Smoker  . Smokeless tobacco: Never Used  Substance and Sexual Activity  . Alcohol use: Yes    Comment: few times a week  . Drug use: No  . Sexual activity: Not on file  Lifestyle  . Physical activity:    Days per week: Not on file    Minutes per session: Not on file  . Stress: Not on file  Relationships  . Social connections:    Talks on phone: Not on file    Gets together: Not on file    Attends religious service: Not on file    Active member of club or organization: Not on file    Attends meetings of clubs or organizations: Not on file    Relationship status: Not on file  . Intimate partner violence:    Fear of current or ex partner: Not on file    Emotionally abused: Not on file    Physically abused: Not on file    Forced sexual activity: Not on file  Other Topics Concern  . Not on file    Social History Narrative  . Not on file    Review of systems: Review of Systems  Constitutional: Negative for fever and chills.  HENT: Negative.   Eyes: Negative for blurred vision.  Respiratory: as per HPI  Cardiovascular: Negative for chest pain and palpitations.  Gastrointestinal: Negative for vomiting, diarrhea, blood per rectum. Genitourinary: Negative for dysuria, urgency, frequency and hematuria.  Musculoskeletal: Negative for myalgias, back pain and joint pain.  Skin: Negative for itching and rash.  Neurological: Negative for dizziness, tremors, focal weakness, seizures and loss of consciousness.  Endo/Heme/Allergies: Negative for environmental allergies.  Psychiatric/Behavioral: Negative for depression, suicidal ideas and hallucinations.  All other systems reviewed and are negative.  Physical Exam: Blood pressure 118/70, pulse 69, height 6\' 4"  (1.93 m), weight 231 lb (104.8 kg), SpO2 97 %. Gen:      No acute distress HEENT:  EOMI, sclera anicteric Neck:     No masses; no thyromegaly Lungs:    Clear to auscultation bilaterally; normal respiratory effort CV:         Regular rate and rhythm; no murmurs Abd:      + bowel sounds; soft, non-tender; no palpable masses, no distension Ext:    No edema; adequate peripheral perfusion Skin:      Warm and dry; no rash Neuro: alert and oriented x 3 Psych: normal mood and affect  Data Reviewed: CT chest 01/18/2018-Multiple subcentimeter pulmonary nodules, centrilobular emphysematous changes. CT chest 04/09/2018- mild centrilobular emphysema, multiple subcentimeter pulmonary nodule.  Stable compared to prior.  Right upper lobe groundglass opacity.  Also stable since last CT scan. I reviewed the images personally  Labs from primary care 7/84/6962 Metabolic panel, hepatic panel-normal WBC 4.8, hemoglobin 15.1, platelets 232, eos 3.9%, absolute eosinophil count-187  Assessment:  Emphysema Seen on recent CT scan.  On my review of the  scan this does not appear to be significant and he has minimal smoking history. No need for therapy as he is asymptomatic. We will evaluate with pulmonary function test.  Subcentimeter pulmonary nodule, groundglass opacity of the right upper lobe This has remained stable on 65-month follow-up scan.  Agree with one-year follow-up To be ordered by primary care  Health maintenance 2016-Prevnar 2017-Pneumovax Gets yearly flu shots.  Plan/Recommendations: - Schedule PFTs  Marshell Garfinkel MD [facility] 05/11/2018, 10:34 AM  CC: Hulan Fess, MD

## 2018-05-12 ENCOUNTER — Encounter: Payer: Self-pay | Admitting: Pulmonary Disease

## 2018-06-19 DIAGNOSIS — R972 Elevated prostate specific antigen [PSA]: Secondary | ICD-10-CM | POA: Diagnosis not present

## 2018-06-22 DIAGNOSIS — Z8679 Personal history of other diseases of the circulatory system: Secondary | ICD-10-CM

## 2018-06-22 HISTORY — DX: Personal history of other diseases of the circulatory system: Z86.79

## 2018-07-11 DIAGNOSIS — Z87442 Personal history of urinary calculi: Secondary | ICD-10-CM | POA: Diagnosis not present

## 2018-07-11 DIAGNOSIS — R972 Elevated prostate specific antigen [PSA]: Secondary | ICD-10-CM | POA: Diagnosis not present

## 2018-07-11 DIAGNOSIS — N5201 Erectile dysfunction due to arterial insufficiency: Secondary | ICD-10-CM | POA: Diagnosis not present

## 2018-08-08 DIAGNOSIS — M25562 Pain in left knee: Secondary | ICD-10-CM | POA: Diagnosis not present

## 2018-08-09 ENCOUNTER — Encounter: Payer: Self-pay | Admitting: Pulmonary Disease

## 2018-08-09 ENCOUNTER — Ambulatory Visit (INDEPENDENT_AMBULATORY_CARE_PROVIDER_SITE_OTHER): Payer: Medicare HMO | Admitting: Pulmonary Disease

## 2018-08-09 ENCOUNTER — Ambulatory Visit: Payer: Medicare HMO | Admitting: Pulmonary Disease

## 2018-08-09 VITALS — BP 116/70 | HR 66 | Ht 76.0 in | Wt 227.0 lb

## 2018-08-09 DIAGNOSIS — J438 Other emphysema: Secondary | ICD-10-CM | POA: Diagnosis not present

## 2018-08-09 LAB — PULMONARY FUNCTION TEST
DL/VA % pred: 99 %
DL/VA: 4.87 ml/min/mmHg/L
DLCO UNC % PRED: 93 %
DLCO unc: 37.89 ml/min/mmHg
FEF 25-75 PRE: 4.51 L/s
FEF 25-75 Post: 4.88 L/sec
FEF2575-%CHANGE-POST: 8 %
FEF2575-%PRED-POST: 156 %
FEF2575-%Pred-Pre: 144 %
FEV1-%CHANGE-POST: 2 %
FEV1-%PRED-POST: 116 %
FEV1-%Pred-Pre: 113 %
FEV1-POST: 4.75 L
FEV1-Pre: 4.63 L
FEV1FVC-%CHANGE-POST: 5 %
FEV1FVC-%Pred-Pre: 107 %
FEV6-%CHANGE-POST: -2 %
FEV6-%Pred-Post: 107 %
FEV6-%Pred-Pre: 110 %
FEV6-PRE: 5.78 L
FEV6-Post: 5.63 L
FEV6FVC-%Change-Post: 0 %
FEV6FVC-%Pred-Post: 105 %
FEV6FVC-%Pred-Pre: 104 %
FVC-%CHANGE-POST: -2 %
FVC-%Pred-Post: 102 %
FVC-%Pred-Pre: 105 %
FVC-PRE: 5.82 L
FVC-Post: 5.66 L
POST FEV1/FVC RATIO: 84 %
PRE FEV6/FVC RATIO: 99 %
Post FEV6/FVC ratio: 100 %
Pre FEV1/FVC ratio: 80 %

## 2018-08-09 NOTE — Progress Notes (Addendum)
BRITTANY AMIRAULT    [id number]    08-23-1951  Primary Care Physician:Little, Lennette Bihari, MD  Referring Physician: Hulan Fess, MD [place], [place] [address]  Chief complaint: Consult for emphysema  HPI: 67 year old with history of melanoma, basal cell carcinoma, hypertension, allergic rhinitis osteoarthritis.  Underwent left knee replacement in December 2018.  Postop he had some chest congestion bronchitis.  CTA showed multiple pulmonary nodules in January which have remained stable on follow-up evaluation and March 2019.  There is also mild groundglass opacity in the right upper lobe and emphysema.  He is referred to pulmonary for further evaluation of emphysema.  Also noted to have a thyroid nodule and he has a thyroid ultrasound and biopsy which was benign.  He is asymptomatic.  He is back to golfing.  Denies any dyspnea, cough, sputum production, wheezing.  History noted for left arm melanoma resected 6 years ago.  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  Smoked pipe for 1 month in college Travel history: Grew up in [place].  [place]acation [place] in the past year. Relevant family history: No significant family history of lung problems.  Interim history: Mr. Martine is here for follow-up review of PFTs States that his breathing is doing well with no complaints today.  He is recovered well from his knee replacement and is golfing as usual.   Outpatient Encounter Medications as of 08/09/2018  Medication Sig  . aspirin EC 81 MG tablet Take 81 mg by mouth daily.  Marland Kitchen esomeprazole (NEXIUM) 10 MG packet Take 10 mg by mouth daily before breakfast.  . fluticasone (FLONASE) 50 MCG/ACT nasal spray Place 2  sprays daily as needed into the nose for allergies.   Marland Kitchen HYDROcodone-acetaminophen (NORCO/VICODIN) 5-325 MG tablet Take 1 tablet by mouth every 6 (six) hours as needed for moderate pain.  Marland Kitchen losartan (COZAAR) 25 MG tablet Take 25 mg by mouth daily.  . Melatonin 5 MG TABS Take 5 mg by mouth at bedtime.  Marland Kitchen oxymetazoline (AFRIN) 0.05 % nasal spray Place 1 spray daily as needed into both nostrils for congestion.  . ranitidine (ZANTAC) 150 MG tablet Take 150 mg 2 (two) times daily by mouth.  . tadalafil (CIALIS) 10 MG tablet Take 5 mg by mouth daily as needed for erectile dysfunction.   . tamsulosin (FLOMAX) 0.4 MG CAPS capsule Take 0.4 mg daily as needed by mouth (as needed for kidney stone flare up).   . [DISCONTINUED] aspirin 81 MG chewable tablet Chew by mouth daily.  . [DISCONTINUED] aspirin EC 325 MG tablet Take 1 tablet (325 mg total) by mouth daily. For 30 days post op for DVT Prophylaxis  . [DISCONTINUED] baclofen (LIORESAL) 10 MG tablet Take 1 tablet (10 mg total) by mouth 3 (three) times daily as needed for muscle spasms.  . [DISCONTINUED] omeprazole (PRILOSEC) 20 MG capsule Take 1 capsule (20 mg total) by mouth daily. 30 days for gastroprotection while taking Aspirin.   No facility-administered encounter medications on file as of 08/09/2018.     Allergies as of 08/09/2018  . (No Known Allergies)    Past Medical History:  Diagnosis Date  . Arrhythmia    paroxysmal afib  . Arthritis   .  Basal cell carcinoma    followed by dermatology  . Cancer ([place])    melanoma - left shoulder  . Complication of anesthesia    slow to wake up  . GERD (gastroesophageal reflux disease)   . H/O colonoscopy    2009, polypoid colorectal mucosa found, Dr. Oletta Lamas advised repeat in 3 years  . Hiatal hernia    upper GI in 2003  . History of kidney stones   . History of nuclear stress test 2005   abnormal, cardiac cath done   . Hypertension     Past Surgical History:  Procedure Laterality Date  .  CARDIAC CATHETERIZATION  2005   no evidence of significamt atherosclerosis  . HERNIA REPAIR     right groin  . KNEE SURGERY     left 1984, right 2010  Arthroscopic  . SHOULDER SURGERY Right    rotator cuff repair  . TOTAL KNEE ARTHROPLASTY Left 11/28/2017   Procedure: TOTAL KNEE ARTHROPLASTY;  Surgeon: Renette Butters, MD;  Location: [facility];  Service: Orthopedics;  Laterality: Left;    Family History  Problem Relation Age of Onset  . Heart attack Mother   . Liver cancer Father   . Multiple sclerosis Sister     Social History   Socioeconomic History  . Marital status: Married    Spouse name: Not on file  . Number of children: Not on file  . Years of education: Not on file  . Highest education level: Not on file  Occupational History  . Not on file  Social Needs  . Financial resource strain: Not on file  . Food insecurity:    Worry: Not on file    Inability: Not on file  . Transportation needs:    Medical: Not on file    Non-medical: Not on file  Tobacco Use  . Smoking status: Never Smoker  . Smokeless tobacco: Never Used  Substance and Sexual Activity  . Alcohol use: Yes    Comment: few times a week  . Drug use: No  . Sexual activity: Not on file  Lifestyle  . Physical activity:    Days per week: Not on file    Minutes per session: Not on file  . Stress: Not on file  Relationships  . Social connections:    Talks on phone: Not on file    Gets together: Not on file    Attends religious service: Not on file    Active member of club or organization: Not on file    Attends meetings of clubs or organizations: Not on file    Relationship status: Not on file  . Intimate partner violence:    Fear of current or ex partner: Not on file    Emotionally abused: Not on file    Physically abused: Not on file    Forced sexual activity: Not on file  Other Topics Concern  . Not on file  Social History Narrative  . Not on file    Review of systems: Review of Systems    Constitutional: Negative for fever and chills.  HENT: Negative.   Eyes: Negative for blurred vision.  Respiratory: as per HPI  Cardiovascular: Negative for chest pain and palpitations.  Gastrointestinal: Negative for vomiting, diarrhea, blood per rectum. Genitourinary: Negative for dysuria, urgency, frequency and hematuria.  Musculoskeletal: Negative for myalgias, back pain and joint pain.  Skin: Negative for itching and rash.  Neurological: Negative for dizziness, tremors, focal weakness, seizures and loss of consciousness.  Endo/Heme/Allergies: Negative for environmental allergies.  Psychiatric/Behavioral: Negative for depression, suicidal ideas and hallucinations.  All other systems reviewed and are negative.  Physical Exam: Blood pressure 116/70, pulse 66, height 6\' 4"  (1.93 m), weight 227 lb (103 kg), SpO2 97 %. Gen:      No acute distress HEENT:  EOMI, sclera anicteric Neck:     No masses; no thyromegaly Lungs:    Clear to auscultation bilaterally; normal respiratory effort CV:         Regular rate and rhythm; no murmurs Abd:      + bowel sounds; soft, non-tender; no palpable masses, no distension Ext:    No edema; adequate peripheral perfusion Skin:      Warm and dry; no rash Neuro: alert and oriented x 3 Psych: normal mood and affect  Data Reviewed: CT chest 01/18/2018-Multiple subcentimeter pulmonary nodules, centrilobular emphysematous changes. CT chest 04/09/2018- mild centrilobular emphysema, multiple subcentimeter pulmonary nodule.  Stable compared to prior.  Right upper lobe groundglass opacity.  Also stable since last CT scan. I reviewed the images personally  Labs from primary care 08/02/8109 Metabolic panel, hepatic panel-normal WBC 4.8, hemoglobin 15.1, platelets 232, eos 3.9%, absolute eosinophil count-187  PFTs 08/09/18 FVC 5.66 [102%], FEV1 4.75 [116%], F/F 84, TLC 84%, DLCO 93% Normal study.  Assessment:  Emphysema Seen on recent CT scan.  On my review of  the scan this does not appear to be significant and he has minimal smoking history. No need for therapy as he is asymptomatic. PFTs reviewed with no evidence of obstruction of COPD.  Subcentimeter pulmonary nodule, groundglass opacity of the right upper lobe This has remained stable on 36-month follow-up scan.  He will need an one-year follow-up around April 2020.  He prefers to follow-up with his primary care for those  Follow-up in pulmonary clinic as needed if the follow-up CT scan shows any findings of concern  Health maintenance 2016-Prevnar 2017-Pneumovax Gets yearly flu shots.  Plan/Recommendations: - Follow-up CT scan, to be done by primary care  Marshell Garfinkel MD [facility] 08/09/2018, 2:15 PM  CC: Hulan Fess, MD

## 2018-08-09 NOTE — Patient Instructions (Addendum)
I reviewed your PFTs which show there is no evidence of COPD This is good news.  You will need a follow-up CT around April 2020 for follow up of the lung nodule and opacity in right lung This can be ordered by your primary care doctor, Dr. Rex Kras as per your preference Please make sure that this does get done   We can see you back in clinic as needed if there is any concern on the follow up scan.

## 2018-08-09 NOTE — Progress Notes (Signed)
PFT done today. 

## 2018-08-30 DIAGNOSIS — R69 Illness, unspecified: Secondary | ICD-10-CM | POA: Diagnosis not present

## 2018-09-20 DIAGNOSIS — D2261 Melanocytic nevi of right upper limb, including shoulder: Secondary | ICD-10-CM | POA: Diagnosis not present

## 2018-09-20 DIAGNOSIS — D225 Melanocytic nevi of trunk: Secondary | ICD-10-CM | POA: Diagnosis not present

## 2018-09-20 DIAGNOSIS — D485 Neoplasm of uncertain behavior of skin: Secondary | ICD-10-CM | POA: Diagnosis not present

## 2018-09-20 DIAGNOSIS — L738 Other specified follicular disorders: Secondary | ICD-10-CM | POA: Diagnosis not present

## 2018-09-20 DIAGNOSIS — L218 Other seborrheic dermatitis: Secondary | ICD-10-CM | POA: Diagnosis not present

## 2018-09-20 DIAGNOSIS — L821 Other seborrheic keratosis: Secondary | ICD-10-CM | POA: Diagnosis not present

## 2018-09-20 DIAGNOSIS — Z85820 Personal history of malignant melanoma of skin: Secondary | ICD-10-CM | POA: Diagnosis not present

## 2018-09-20 DIAGNOSIS — L570 Actinic keratosis: Secondary | ICD-10-CM | POA: Diagnosis not present

## 2018-09-20 DIAGNOSIS — D2262 Melanocytic nevi of left upper limb, including shoulder: Secondary | ICD-10-CM | POA: Diagnosis not present

## 2018-09-20 DIAGNOSIS — Z85828 Personal history of other malignant neoplasm of skin: Secondary | ICD-10-CM | POA: Diagnosis not present

## 2018-10-04 DIAGNOSIS — R69 Illness, unspecified: Secondary | ICD-10-CM | POA: Diagnosis not present

## 2018-12-13 DIAGNOSIS — J3489 Other specified disorders of nose and nasal sinuses: Secondary | ICD-10-CM | POA: Diagnosis not present

## 2018-12-13 DIAGNOSIS — Z1159 Encounter for screening for other viral diseases: Secondary | ICD-10-CM | POA: Diagnosis not present

## 2018-12-20 ENCOUNTER — Encounter: Payer: Self-pay | Admitting: Cardiology

## 2019-01-18 ENCOUNTER — Ambulatory Visit: Payer: Medicare HMO | Admitting: Cardiology

## 2019-01-18 ENCOUNTER — Encounter: Payer: Self-pay | Admitting: Cardiology

## 2019-01-18 VITALS — BP 110/62 | HR 73 | Ht 76.0 in | Wt 229.0 lb

## 2019-01-18 DIAGNOSIS — I480 Paroxysmal atrial fibrillation: Secondary | ICD-10-CM | POA: Diagnosis not present

## 2019-01-18 DIAGNOSIS — I10 Essential (primary) hypertension: Secondary | ICD-10-CM

## 2019-01-18 NOTE — Patient Instructions (Addendum)
Medication Instructions:  Stop: Aspirin If you need a refill on your cardiac medications before your next appointment, please call your pharmacy.   Lab work: None If you have labs (blood work) drawn today and your tests are completely normal, you will receive your results only by: Marland Kitchen MyChart Message (if you have MyChart) OR . A paper copy in the mail If you have any lab test that is abnormal or we need to change your treatment, we will call you to review the results.  Testing/Procedures: None  Follow-Up: At [facility], you and your health needs are our priority.  As part of our continuing mission to provide you with exceptional heart care, we have created designated Provider Care Teams.  These Care Teams include your primary Cardiologist (physician) and Advanced Practice Providers (APPs -  Physician Assistants and Nurse Practitioners) who all work together to provide you with the care you need, when you need it. You will need a follow up appointment in 1 years.  Please call our office 2 months in advance to schedule this appointment.  You may see Dr. Radford Pax or one of the following Advanced Practice Providers on your designated Care Team:   [place], PA-C Melina Copa, PA-C . Ermalinda Barrios, PA-C

## 2019-01-18 NOTE — Progress Notes (Signed)
Cardiology Office Note:    Date:  01/18/2019   ID:  David Vargas, DOB 11-Jul-1951, MRN [id number]  PCP:  Hulan Fess, MD  Cardiologist:  No primary care provider on file.    Referring MD: Hulan Fess, MD   Chief Complaint  Patient presents with  . Atrial Fibrillation  . Hypertension    History of Present Illness:    David Vargas is a 68 y.o. male with a hx of  PAF that occurred once with a kidney stone and he has been treated with aspirin.  He also has hypertension, hyperlipidemia and mildly dilated aortic root.  Last echo in 2015 was normal LVEF 60-65% with trivial TR and normal aortic root.  Cardiac catheterization in 2005 for abnormal nuclear study showed no significant CAD echo probable intramyocardial mid LAD segment with minimal systolic depression.  He is here today for followup and is doing well.  He denies any chest pain or pressure, SOB, DOE, PND, orthopnea, LE edema, dizziness, palpitations or syncope. He is compliant with his meds and is tolerating meds with no SE.    Past Medical History:  Diagnosis Date  . Arrhythmia    paroxysmal afib  . Arthritis   . Basal cell carcinoma    followed by dermatology  . Cancer ([place])    melanoma - left shoulder  . Complication of anesthesia    slow to wake up  . GERD (gastroesophageal reflux disease)   . H/O colonoscopy    2009, polypoid colorectal mucosa found, Dr. Oletta Lamas advised repeat in 3 years  . Hiatal hernia    upper GI in 2003  . History of kidney stones   . History of nuclear stress test 2005   abnormal, cardiac cath done   . Hypertension     Past Surgical History:  Procedure Laterality Date  . CARDIAC CATHETERIZATION  2005   no evidence of significamt atherosclerosis  . HERNIA REPAIR     right groin  . KNEE SURGERY     left 1984, right 2010  Arthroscopic  . SHOULDER SURGERY Right    rotator cuff repair  . TOTAL KNEE ARTHROPLASTY Left 11/28/2017   Procedure: TOTAL KNEE ARTHROPLASTY;  Surgeon: Renette Butters, MD;  Location: [facility];  Service: Orthopedics;  Laterality: Left;    Current Medications: Current Meds  Medication Sig  . aspirin EC 81 MG tablet Take 81 mg by mouth daily.  . fluticasone (FLONASE) 50 MCG/ACT nasal spray Place 2 sprays daily as needed into the nose for allergies.   Marland Kitchen HYDROcodone-acetaminophen (NORCO/VICODIN) 5-325 MG tablet Take 1 tablet by mouth every 6 (six) hours as needed for moderate pain.  . Melatonin 5 MG TABS Take 5 mg by mouth at bedtime.  Marland Kitchen oxymetazoline (AFRIN) 0.05 % nasal spray Place 1 spray daily as needed into both nostrils for congestion.  . ranitidine (ZANTAC) 150 MG tablet Take 150 mg 2 (two) times daily by mouth.  . tadalafil (CIALIS) 10 MG tablet Take 5 mg by mouth daily as needed for erectile dysfunction.   . tamsulosin (FLOMAX) 0.4 MG CAPS capsule Take 0.4 mg daily as needed by mouth (as needed for kidney stone flare up).      Allergies:   Patient has no known allergies.   Social History   Socioeconomic History  . Marital status: Married    Spouse name: Not on file  . Number of children: Not on file  . Years of education: Not on file  .  Highest education level: Not on file  Occupational History  . Not on file  Social Needs  . Financial resource strain: Not on file  . Food insecurity:    Worry: Not on file    Inability: Not on file  . Transportation needs:    Medical: Not on file    Non-medical: Not on file  Tobacco Use  . Smoking status: Never Smoker  . Smokeless tobacco: Never Used  Substance and Sexual Activity  . Alcohol use: Yes    Comment: few times a week  . Drug use: No  . Sexual activity: Not on file  Lifestyle  . Physical activity:    Days per week: Not on file    Minutes per session: Not on file  . Stress: Not on file  Relationships  . Social connections:    Talks on phone: Not on file    Gets together: Not on file    Attends religious service: Not on file    Active member of club or organization: Not on  file    Attends meetings of clubs or organizations: Not on file    Relationship status: Not on file  Other Topics Concern  . Not on file  Social History Narrative  . Not on file     Family History: The patient's family history includes Heart attack in his mother; Liver cancer in his father; Multiple sclerosis in his sister.  ROS:   Please see the history of present illness.    ROS  All other systems reviewed and negative.   EKGs/Labs/Other Studies Reviewed:    The following studies were reviewed today: none  EKG:  EKG is  ordered today.  The ekg ordered today demonstrates normal sinus rhythm at 73 bpm with no ST changes.  Recent Labs: No results found for requested labs within last 8760 hours.   Recent Lipid Panel No results found for: CHOL, TRIG, HDL, CHOLHDL, VLDL, LDLCALC, LDLDIRECT  Physical Exam:    VS:  BP 110/62   Pulse 73   Ht 6\' 4"  (1.93 m)   Wt 229 lb (103.9 kg)   BMI 27.87 kg/m     Wt Readings from Last 3 Encounters:  01/18/19 229 lb (103.9 kg)  08/09/18 227 lb (103 kg)  05/11/18 231 lb (104.8 kg)     GEN:  Well nourished, well developed in no acute distress HEENT: Normal NECK: No JVD; No carotid bruits LYMPHATICS: No lymphadenopathy CARDIAC: RRR, no murmurs, rubs, gallops RESPIRATORY:  Clear to auscultation without rales, wheezing or rhonchi  ABDOMEN: Soft, non-tender, non-distended MUSCULOSKELETAL:  No edema; No deformity  SKIN: Warm and dry NEUROLOGIC:  Alert and oriented x 3 PSYCHIATRIC:  Normal affect   ASSESSMENT:    1. PAF (paroxysmal atrial fibrillation) ([place])   2. Essential hypertension, benign    PLAN:    In order of problems listed above:  1.  PAF -he is maintaining normal sinus rhythm on exam and has had no episodes of PAF or palpitations since seen last.  His CHADS2VASC score is 2 but his one episode of A. fib that he had was in the setting of a kidney stone he has had no further episodes.  Was likely triggered by increased  catecholamine surge with pain from his kidney stone.  At this time will continue to hold on long-term anticoagulation unless he has a recurrence.  I did tell him that he was okay to stop his aspirin.  2.  HTN -BP is well  controlled on exam today.  He will continue on telmisartan 40 mg daily.   Medication Adjustments/Labs and Tests Ordered: Current medicines are reviewed at length with the patient today.  Concerns regarding medicines are outlined above.  Orders Placed This Encounter  Procedures  . EKG 12-Lead   No orders of the defined types were placed in this encounter.   Signed, Fransico Him, MD  01/18/2019 8:40 AM    [place]

## 2019-02-21 DIAGNOSIS — R972 Elevated prostate specific antigen [PSA]: Secondary | ICD-10-CM | POA: Diagnosis not present

## 2019-02-28 DIAGNOSIS — R972 Elevated prostate specific antigen [PSA]: Secondary | ICD-10-CM | POA: Diagnosis not present

## 2019-02-28 DIAGNOSIS — N5201 Erectile dysfunction due to arterial insufficiency: Secondary | ICD-10-CM | POA: Diagnosis not present

## 2019-02-28 DIAGNOSIS — Z87442 Personal history of urinary calculi: Secondary | ICD-10-CM | POA: Diagnosis not present

## 2019-03-04 IMAGING — US US FNA BIOPSY THYROID 1ST LESION
1 series · 13 of 17 positions shown · non-contrast
Comparison: US Thyroid 04/19/18

MEDICATIONS:
5 cc 1% lidocaine

COMPLICATIONS:
None immediate.

INDICATION: Indeterminate thyroid nodule

Left mid lobe thyroid nodule
2.1 cm
EXAM:
ULTRASOUND GUIDED FINE NEEDLE ASPIRATION OF INDETERMINATE THYROID
NODULE
TECHNIQUE: Informed written consent was obtained from the patient after a
discussion of the risks, benefits and alternatives to treatment.
Questions regarding the procedure were encouraged and answered. A
timeout was performed prior to the initiation of the procedure.

[Series 1: us fna biopsy thyroid 1st lesion · 0.06mm/px · 17 acquisitions, 13 frames shown]
[im 1/17]
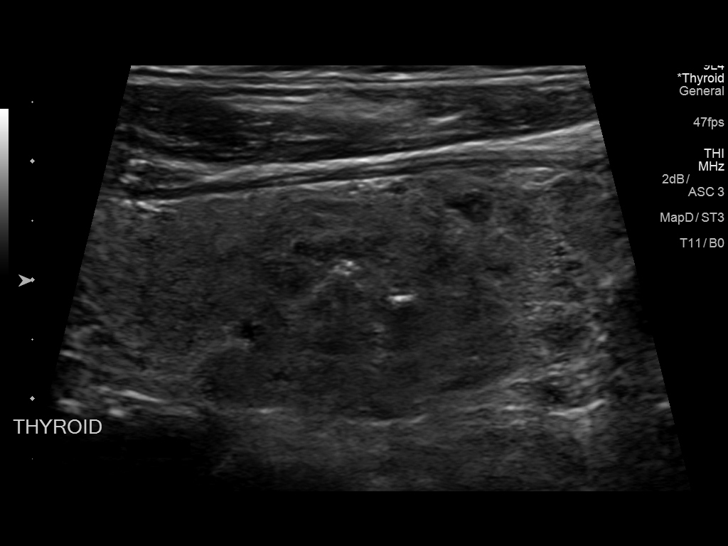
[im 2/17]
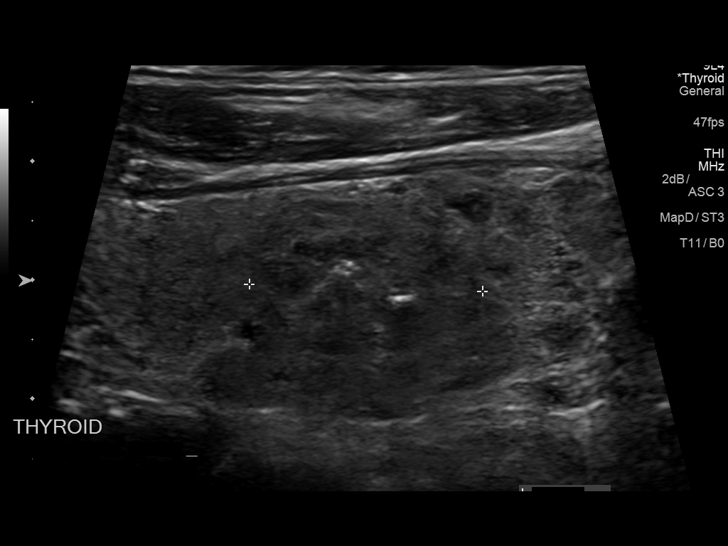
[im 4/17]
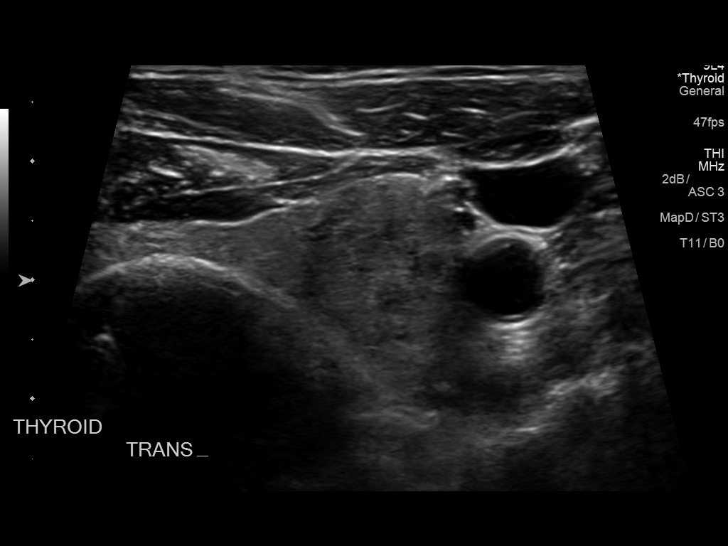
[im 5/17]
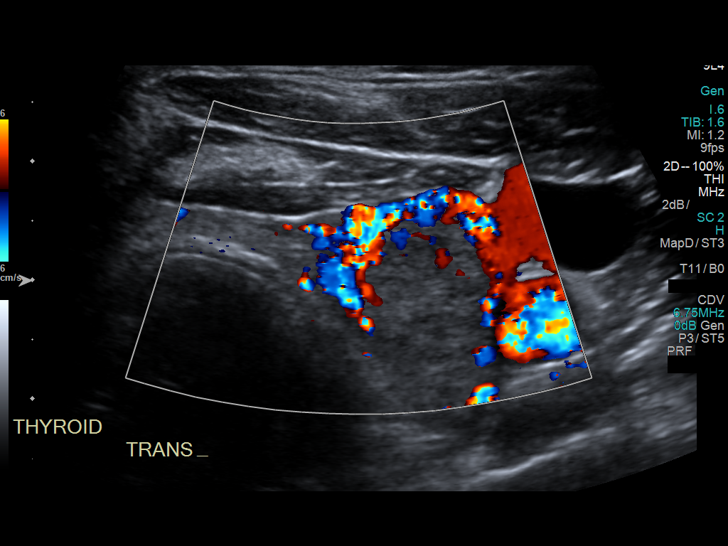
[im 6/17]
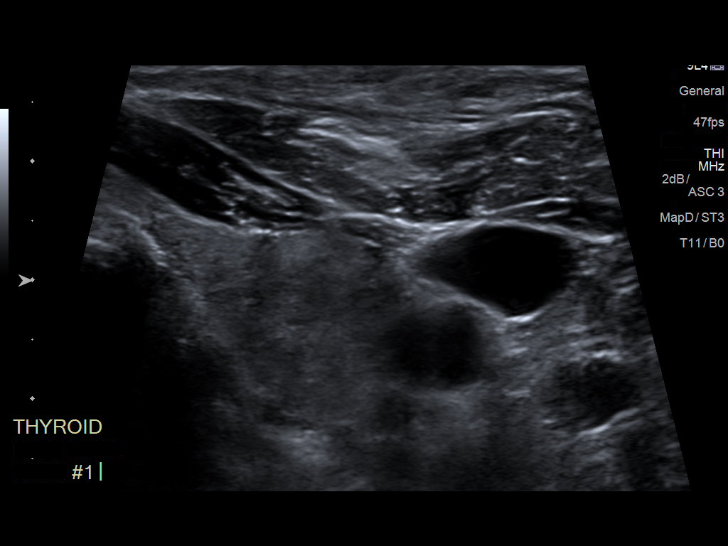
[im 8/17]
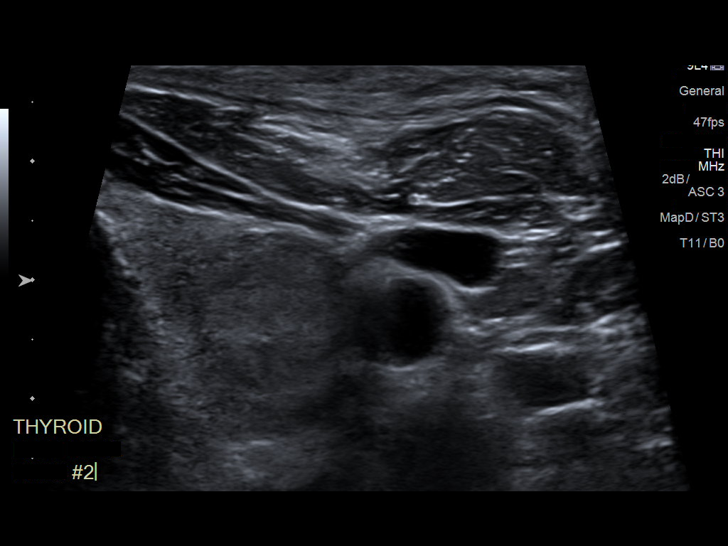
[im 9/17]
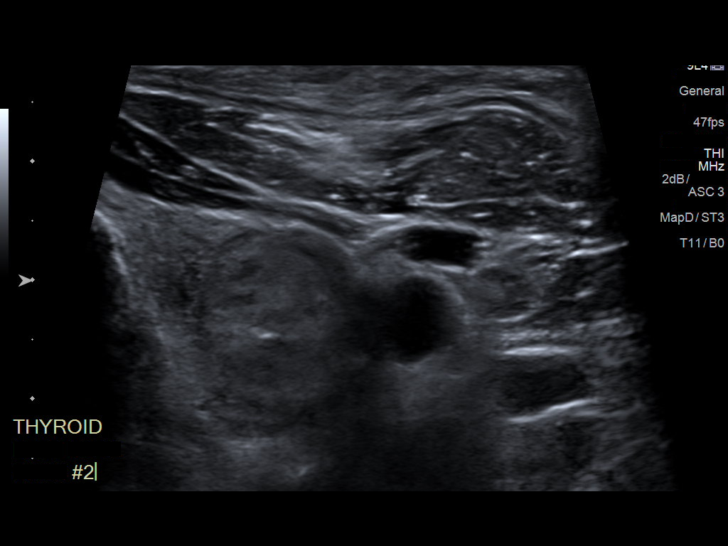
[im 10/17]
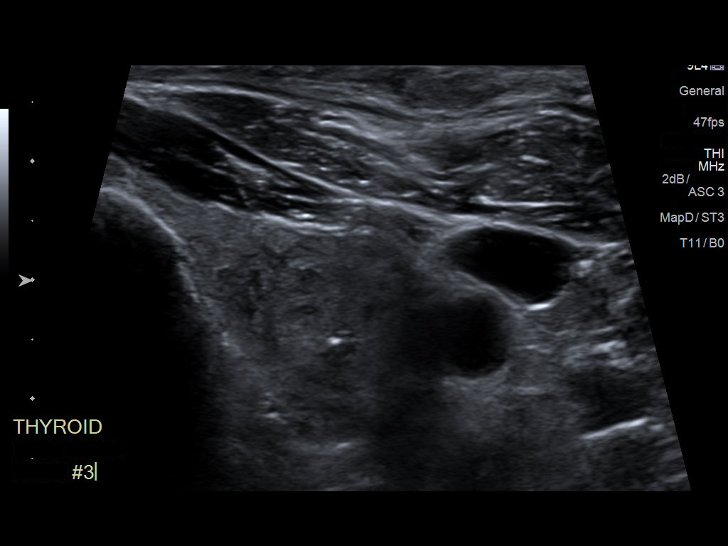
[im 12/17]
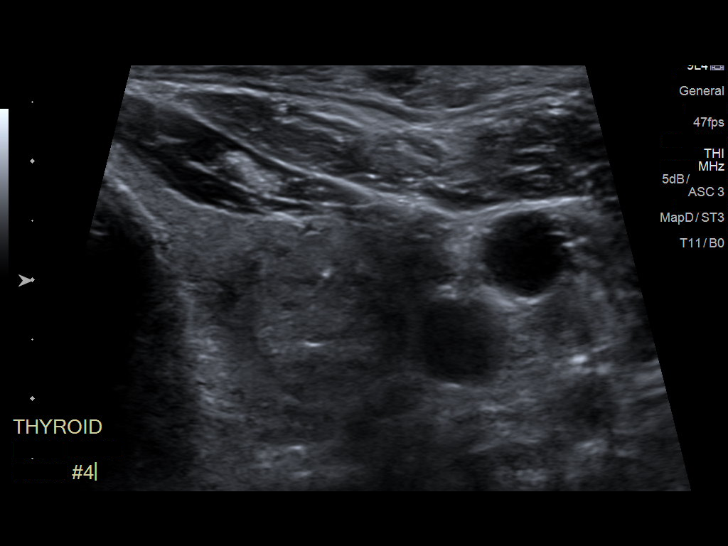
[im 13/17]
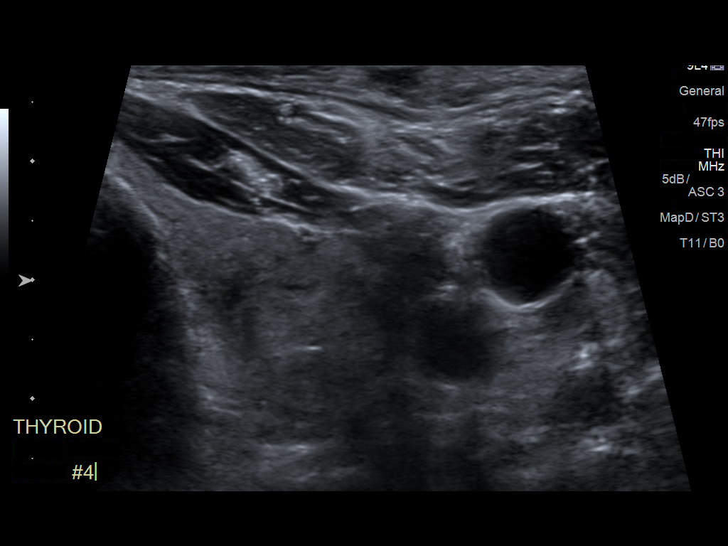
[im 14/17]
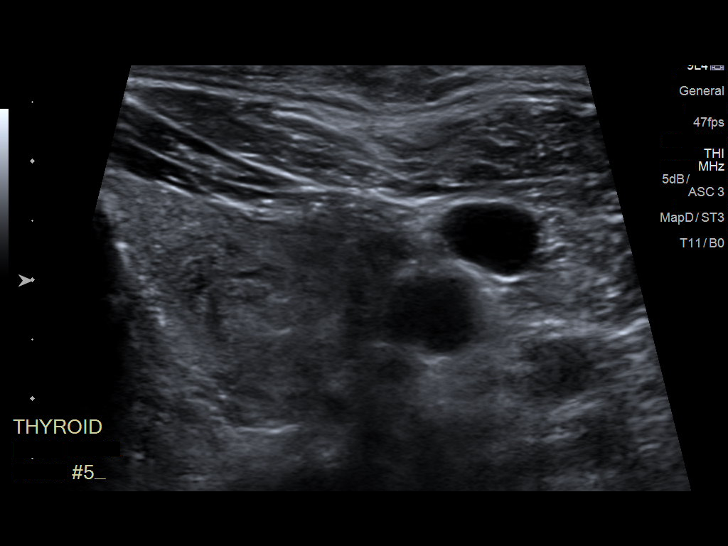
[im 16/17]
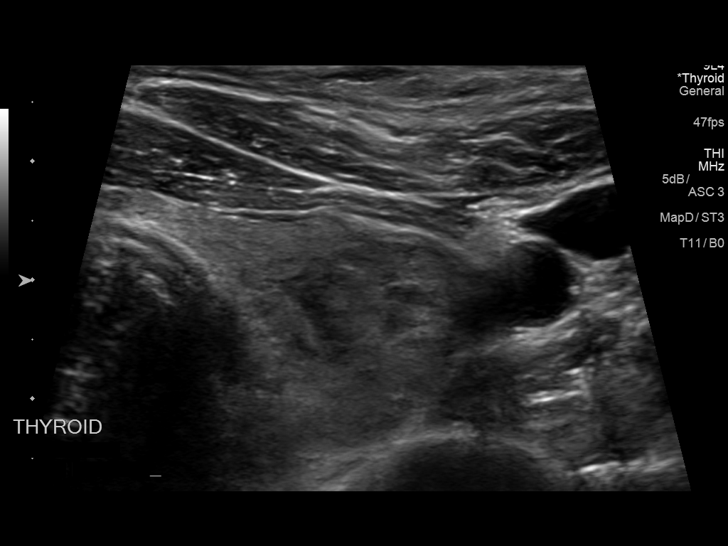
[im 17/17]
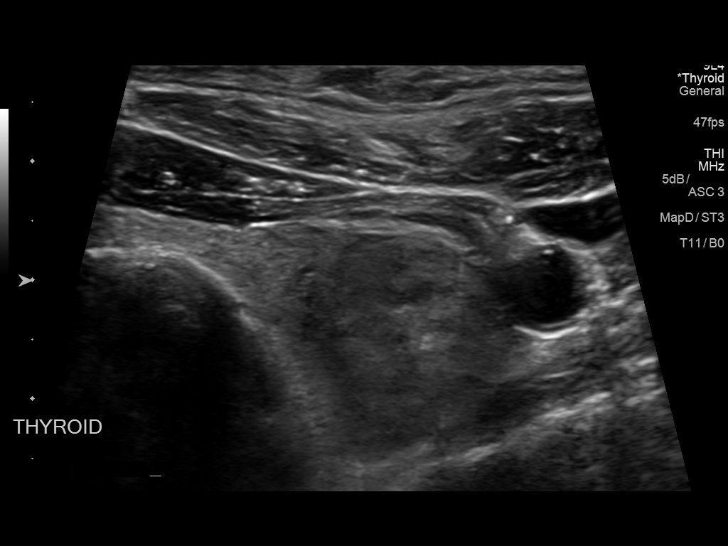

[13 of 17 positions shown; findings below may reference images not displayed]

Pre-procedural ultrasound scanning demonstrated unchanged size and
appearance of the indeterminate nodule within the left thyroid

The procedure was planned. The neck was prepped in the usual sterile
fashion, and a sterile drape was applied covering the operative
field. A timeout was performed prior to the initiation of the
procedure. Local anesthesia was provided with 1% lidocaine.

Under direct ultrasound guidance, 5 FNA biopsies were performed of
the left mid thyroid nodule with a 25 gauge needle.

2 of these samples were obtained for AFIRMA per ordering DANII

Multiple ultrasound images were saved for procedural documentation
purposes. The samples were prepared and submitted to pathology.

Limited post procedural scanning was negative for hematoma or
additional complication. Dressings were placed. The patient
tolerated the above procedures procedure well without immediate
postprocedural complication.
FINDINGS: Nodule reference number based on prior diagnostic ultrasound: 1

Maximum size: 2.1 cm

Location: Left; Mid

ACR TI-RADS risk category: TR4 (4-6 points)

Reason for biopsy: meets ACR TI-RADS criteria

Ultrasound imaging confirms appropriate placement of the needles
within the thyroid nodule.
IMPRESSION: Technically successful ultrasound guided fine needle aspiration of
left mid lobe thyroid nodule

Read by

Komarodin Kasah

## 2019-03-27 DIAGNOSIS — D225 Melanocytic nevi of trunk: Secondary | ICD-10-CM | POA: Diagnosis not present

## 2019-03-27 DIAGNOSIS — L821 Other seborrheic keratosis: Secondary | ICD-10-CM | POA: Diagnosis not present

## 2019-03-27 DIAGNOSIS — D1801 Hemangioma of skin and subcutaneous tissue: Secondary | ICD-10-CM | POA: Diagnosis not present

## 2019-03-27 DIAGNOSIS — D2262 Melanocytic nevi of left upper limb, including shoulder: Secondary | ICD-10-CM | POA: Diagnosis not present

## 2019-03-27 DIAGNOSIS — D485 Neoplasm of uncertain behavior of skin: Secondary | ICD-10-CM | POA: Diagnosis not present

## 2019-03-27 DIAGNOSIS — D2272 Melanocytic nevi of left lower limb, including hip: Secondary | ICD-10-CM | POA: Diagnosis not present

## 2019-03-27 DIAGNOSIS — Z85828 Personal history of other malignant neoplasm of skin: Secondary | ICD-10-CM | POA: Diagnosis not present

## 2019-03-27 DIAGNOSIS — L723 Sebaceous cyst: Secondary | ICD-10-CM | POA: Diagnosis not present

## 2019-03-27 DIAGNOSIS — C44712 Basal cell carcinoma of skin of right lower limb, including hip: Secondary | ICD-10-CM | POA: Diagnosis not present

## 2019-03-27 DIAGNOSIS — L570 Actinic keratosis: Secondary | ICD-10-CM | POA: Diagnosis not present

## 2019-03-27 DIAGNOSIS — D2261 Melanocytic nevi of right upper limb, including shoulder: Secondary | ICD-10-CM | POA: Diagnosis not present

## 2019-04-25 DIAGNOSIS — H02811 Retained foreign body in right upper eyelid: Secondary | ICD-10-CM | POA: Diagnosis not present

## 2019-06-05 DIAGNOSIS — Z86010 Personal history of colonic polyps: Secondary | ICD-10-CM | POA: Diagnosis not present

## 2019-06-05 DIAGNOSIS — Z0000 Encounter for general adult medical examination without abnormal findings: Secondary | ICD-10-CM | POA: Diagnosis not present

## 2019-06-05 DIAGNOSIS — J432 Centrilobular emphysema: Secondary | ICD-10-CM | POA: Diagnosis not present

## 2019-06-05 DIAGNOSIS — Z8679 Personal history of other diseases of the circulatory system: Secondary | ICD-10-CM | POA: Diagnosis not present

## 2019-06-05 DIAGNOSIS — E041 Nontoxic single thyroid nodule: Secondary | ICD-10-CM | POA: Diagnosis not present

## 2019-06-05 DIAGNOSIS — Z85820 Personal history of malignant melanoma of skin: Secondary | ICD-10-CM | POA: Diagnosis not present

## 2019-06-05 DIAGNOSIS — R918 Other nonspecific abnormal finding of lung field: Secondary | ICD-10-CM | POA: Diagnosis not present

## 2019-06-05 DIAGNOSIS — Z87442 Personal history of urinary calculi: Secondary | ICD-10-CM | POA: Diagnosis not present

## 2019-06-05 DIAGNOSIS — R972 Elevated prostate specific antigen [PSA]: Secondary | ICD-10-CM | POA: Diagnosis not present

## 2019-06-05 DIAGNOSIS — I10 Essential (primary) hypertension: Secondary | ICD-10-CM | POA: Diagnosis not present

## 2019-06-18 DIAGNOSIS — H524 Presbyopia: Secondary | ICD-10-CM | POA: Diagnosis not present

## 2019-06-26 DIAGNOSIS — M545 Low back pain: Secondary | ICD-10-CM | POA: Diagnosis not present

## 2019-06-26 DIAGNOSIS — M5136 Other intervertebral disc degeneration, lumbar region: Secondary | ICD-10-CM | POA: Diagnosis not present

## 2019-06-27 DIAGNOSIS — R69 Illness, unspecified: Secondary | ICD-10-CM | POA: Diagnosis not present

## 2019-07-04 DIAGNOSIS — E041 Nontoxic single thyroid nodule: Secondary | ICD-10-CM | POA: Diagnosis not present

## 2019-07-11 ENCOUNTER — Other Ambulatory Visit: Payer: Self-pay | Admitting: Endocrinology

## 2019-07-11 DIAGNOSIS — E041 Nontoxic single thyroid nodule: Secondary | ICD-10-CM

## 2019-07-18 ENCOUNTER — Ambulatory Visit
Admission: RE | Admit: 2019-07-18 | Discharge: 2019-07-18 | Disposition: A | Discharge status: Home / Self Care | Payer: Medicare HMO | Source: Ambulatory Visit | Attending: Endocrinology | Admitting: Endocrinology

## 2019-07-18 DIAGNOSIS — E041 Nontoxic single thyroid nodule: Secondary | ICD-10-CM

## 2019-08-14 ENCOUNTER — Other Ambulatory Visit: Payer: Self-pay

## 2019-08-14 DIAGNOSIS — Z20822 Contact with and (suspected) exposure to covid-19: Secondary | ICD-10-CM

## 2019-08-15 LAB — NOVEL CORONAVIRUS, NAA: SARS-CoV-2, NAA: NOT DETECTED

## 2019-09-04 DIAGNOSIS — R69 Illness, unspecified: Secondary | ICD-10-CM | POA: Diagnosis not present

## 2019-10-01 DIAGNOSIS — Z85820 Personal history of malignant melanoma of skin: Secondary | ICD-10-CM | POA: Diagnosis not present

## 2019-10-01 DIAGNOSIS — D2371 Other benign neoplasm of skin of right lower limb, including hip: Secondary | ICD-10-CM | POA: Diagnosis not present

## 2019-10-01 DIAGNOSIS — L821 Other seborrheic keratosis: Secondary | ICD-10-CM | POA: Diagnosis not present

## 2019-10-01 DIAGNOSIS — D225 Melanocytic nevi of trunk: Secondary | ICD-10-CM | POA: Diagnosis not present

## 2019-10-01 DIAGNOSIS — D2262 Melanocytic nevi of left upper limb, including shoulder: Secondary | ICD-10-CM | POA: Diagnosis not present

## 2019-10-01 DIAGNOSIS — L720 Epidermal cyst: Secondary | ICD-10-CM | POA: Diagnosis not present

## 2019-10-01 DIAGNOSIS — Z85828 Personal history of other malignant neoplasm of skin: Secondary | ICD-10-CM | POA: Diagnosis not present

## 2019-10-01 DIAGNOSIS — L723 Sebaceous cyst: Secondary | ICD-10-CM | POA: Diagnosis not present

## 2019-10-01 DIAGNOSIS — D2261 Melanocytic nevi of right upper limb, including shoulder: Secondary | ICD-10-CM | POA: Diagnosis not present

## 2019-10-01 DIAGNOSIS — D1801 Hemangioma of skin and subcutaneous tissue: Secondary | ICD-10-CM | POA: Diagnosis not present

## 2019-10-21 DIAGNOSIS — N201 Calculus of ureter: Secondary | ICD-10-CM | POA: Diagnosis not present

## 2019-11-04 DIAGNOSIS — N201 Calculus of ureter: Secondary | ICD-10-CM | POA: Diagnosis not present

## 2019-12-24 DIAGNOSIS — J432 Centrilobular emphysema: Secondary | ICD-10-CM | POA: Diagnosis not present

## 2019-12-24 DIAGNOSIS — I10 Essential (primary) hypertension: Secondary | ICD-10-CM | POA: Diagnosis not present

## 2019-12-24 DIAGNOSIS — I480 Paroxysmal atrial fibrillation: Secondary | ICD-10-CM | POA: Diagnosis not present

## 2020-01-14 DIAGNOSIS — R69 Illness, unspecified: Secondary | ICD-10-CM | POA: Diagnosis not present

## 2020-01-15 NOTE — Progress Notes (Signed)
Cardiology Office Note:    Date:  01/16/2020   ID:  David Vargas, DOB [birth year], MRN [id number]  PCP:  Hulan Fess, MD  Cardiologist:  Fransico Him, MD    Referring MD: Hulan Fess, MD   Chief Complaint  Patient presents with  . Atrial Fibrillation  . Hypertension    History of Present Illness:    David Vargas is a 69 y.o. male with a hx of PAF that occurred once with a kidney stone and he has been treated with aspirin. He also has hypertension, hyperlipidemia and mildly dilated aortic root. Last echo in 2015 was normal LVEF 60-65% with trivial TR and normal aortic root. Cardiac catheterization in 2005 for abnormal nuclear study showed no significant CAD echo probable intramyocardial mid LAD segment with minimal systolic depression.  He is here today for followup and is doing well.  He denies any chest pain or pressure, SOB, DOE, PND, orthopnea, LE edema, dizziness, palpitations or syncope. He is compliant with his meds and is tolerating meds with no SE.    Past Medical History:  Diagnosis Date  . Arrhythmia    paroxysmal afib  . Arthritis   . Basal cell carcinoma    followed by dermatology  . Cancer ([place])    melanoma - left shoulder  . Complication of anesthesia    slow to wake up  . GERD (gastroesophageal reflux disease)   . H/O colonoscopy    2009, polypoid colorectal mucosa found, Dr. Oletta Lamas advised repeat in 3 years  . Hiatal hernia    upper GI in 2003  . History of kidney stones   . History of nuclear stress test 2005   abnormal, cardiac cath done   . Hypertension     Past Surgical History:  Procedure Laterality Date  . CARDIAC CATHETERIZATION  2005   no evidence of significamt atherosclerosis  . HERNIA REPAIR     right groin  . KNEE SURGERY     left 1984, right 2010  Arthroscopic  . SHOULDER SURGERY Right    rotator cuff repair  . TOTAL KNEE ARTHROPLASTY Left 11/28/2017   Procedure: TOTAL KNEE ARTHROPLASTY;  Surgeon: Renette Butters, MD;   Location: [facility];  Service: Orthopedics;  Laterality: Left;    Current Medications: Current Meds  Medication Sig  . fluticasone (FLONASE) 50 MCG/ACT nasal spray Place 2 sprays daily as needed into the nose for allergies.   Marland Kitchen HYDROcodone-acetaminophen (NORCO/VICODIN) 5-325 MG tablet Take 1 tablet by mouth every 6 (six) hours as needed for moderate pain.  . Melatonin 5 MG TABS Take 5 mg by mouth at bedtime.  Marland Kitchen oxymetazoline (AFRIN) 0.05 % nasal spray Place 1 spray daily as needed into both nostrils for congestion.  . tadalafil (CIALIS) 10 MG tablet Take 5 mg by mouth daily as needed for erectile dysfunction.   . tamsulosin (FLOMAX) 0.4 MG CAPS capsule Take 0.4 mg daily as needed by mouth (as needed for kidney stone flare up).      Allergies:   Patient has no known allergies.   Social History   Socioeconomic History  . Marital status: Married    Spouse name: Not on file  . Number of children: Not on file  . Years of education: Not on file  . Highest education level: Not on file  Occupational History  . Not on file  Tobacco Use  . Smoking status: Never Smoker  . Smokeless tobacco: Never Used  Substance and Sexual Activity  .  Alcohol use: Yes    Comment: few times a week  . Drug use: No  . Sexual activity: Not on file  Other Topics Concern  . Not on file  Social History Narrative  . Not on file   Social Determinants of Health   Financial Resource Strain:   . Difficulty of Paying Living Expenses: Not on file  Food Insecurity:   . Worried About Charity fundraiser in the Last Year: Not on file  . Ran Out of Food in the Last Year: Not on file  Transportation Needs:   . Lack of Transportation (Medical): Not on file  . Lack of Transportation (Non-Medical): Not on file  Physical Activity:   . Days of Exercise per Week: Not on file  . Minutes of Exercise per Session: Not on file  Stress:   . Feeling of Stress : Not on file  Social Connections:   . Frequency of Communication  with Friends and Family: Not on file  . Frequency of Social Gatherings with Friends and Family: Not on file  . Attends Religious Services: Not on file  . Active Member of Clubs or Organizations: Not on file  . Attends Archivist Meetings: Not on file  . Marital Status: Not on file     Family History: The patient's family history includes Heart attack in his mother; Liver cancer in his father; Multiple sclerosis in his sister.  ROS:   Please see the history of present illness.    ROS  All other systems reviewed and negative.   EKGs/Labs/Other Studies Reviewed:    The following studies were reviewed today: Labs, echo, EKG  EKG:  EKG is  ordered today.  The ekg ordered today demonstrates NSR with no ST changes  Recent Labs: No results found for requested labs within last 8760 hours.   Recent Lipid Panel No results found for: CHOL, TRIG, HDL, CHOLHDL, VLDL, LDLCALC, LDLDIRECT  Physical Exam:    VS:  BP 130/78   Pulse 62   Ht 6\' 4"  (1.93 m)   Wt 221 lb 6.4 oz (100.4 kg)   BMI 26.95 kg/m     Wt Readings from Last 3 Encounters:  01/16/20 221 lb 6.4 oz (100.4 kg)  01/18/19 229 lb (103.9 kg)  08/09/18 227 lb (103 kg)     GEN:  Well nourished, well developed in no acute distress HEENT: Normal NECK: No JVD; No carotid bruits LYMPHATICS: No lymphadenopathy CARDIAC: RRR, no murmurs, rubs, gallops RESPIRATORY:  Clear to auscultation without rales, wheezing or rhonchi  ABDOMEN: Soft, non-tender, non-distended MUSCULOSKELETAL:  No edema; No deformity  SKIN: Warm and dry NEUROLOGIC:  Alert and oriented x 3 PSYCHIATRIC:  Normal affect   ASSESSMENT:    1. PAF (paroxysmal atrial fibrillation) ([place])   2. Essential hypertension, benign    PLAN:    In order of problems listed above:  1.  PAF -he is maintaining NSR -denies any palpitations in the past -His CHADS2VASC score is 2 but his one episode of A. fib that he had was in the setting of a kidney stone he has  had no further episodes.  Was likely triggered by increased catecholamine surge with pain from his kidney stone -he will let me know if he develops any palpitations  2.  HTN -BP controlled -he has lost over 60lbs in the past 7 years and is now off BP meds   Medication Adjustments/Labs and Tests Ordered: Current medicines are reviewed at length with  the patient today.  Concerns regarding medicines are outlined above.  Orders Placed This Encounter  Procedures  . EKG 12-Lead   No orders of the defined types were placed in this encounter.   Signed, Fransico Him, MD  01/16/2020 11:05 AM    [place]

## 2020-01-16 ENCOUNTER — Encounter: Payer: Self-pay | Admitting: Cardiology

## 2020-01-16 ENCOUNTER — Other Ambulatory Visit: Payer: Self-pay

## 2020-01-16 ENCOUNTER — Ambulatory Visit: Payer: Medicare HMO | Admitting: Cardiology

## 2020-01-16 VITALS — BP 130/78 | HR 62 | Ht 76.0 in | Wt 221.4 lb

## 2020-01-16 DIAGNOSIS — I10 Essential (primary) hypertension: Secondary | ICD-10-CM

## 2020-01-16 DIAGNOSIS — I480 Paroxysmal atrial fibrillation: Secondary | ICD-10-CM

## 2020-01-16 NOTE — Patient Instructions (Signed)
Medication Instructions:  °Your physician recommends that you continue on your current medications as directed. Please refer to the Current Medication list given to you today. ° °*If you need a refill on your cardiac medications before your next appointment, please call your pharmacy* ° °Follow-Up: °At [facility], you and your health needs are our priority.  As part of our continuing mission to provide you with exceptional heart care, we have created designated Provider Care Teams.  These Care Teams include your primary Cardiologist (physician) and Advanced Practice Providers (APPs -  Physician Assistants and Nurse Practitioners) who all work together to provide you with the care you need, when you need it. ° ° °Your next appointment:   °1 year(s) ° °The format for your next appointment:   °Either In Person or Virtual ° °Provider:   °You may see Traci Turner, MD or one of the following Advanced Practice Providers on your designated Care Team:   °· Dayna Dunn, PA-C °· Michele Lenze, PA-C ° ° °

## 2020-01-26 ENCOUNTER — Ambulatory Visit: Payer: Medicare HMO

## 2020-01-31 ENCOUNTER — Ambulatory Visit: Payer: Medicare HMO | Attending: Internal Medicine

## 2020-01-31 ENCOUNTER — Ambulatory Visit: Payer: Medicare HMO

## 2020-01-31 DIAGNOSIS — Z23 Encounter for immunization: Secondary | ICD-10-CM

## 2020-01-31 NOTE — Progress Notes (Signed)
   Covid-19 Vaccination Clinic  Name:  David Vargas    MRN: [id number] DOB: 07/07/1951  01/31/2020  David Vargas was observed post Covid-19 immunization for 15 minutes without incidence. He was provided with Vaccine Information Sheet and instruction to access the V-Safe system.   David Vargas was instructed to call 911 with any severe reactions post vaccine: Marland Kitchen Difficulty breathing  . Swelling of your face and throat  . A fast heartbeat  . A bad rash all over your body  . Dizziness and weakness    Immunizations Administered    Name Date Dose VIS Date Route   Pfizer COVID-19 Vaccine 01/31/2020  9:21 AM 0.3 mL 12/06/2019 Intramuscular   Manufacturer: [place]   Lot: [id number]   [place]: [id number]

## 2020-02-06 DIAGNOSIS — R972 Elevated prostate specific antigen [PSA]: Secondary | ICD-10-CM | POA: Diagnosis not present

## 2020-02-06 DIAGNOSIS — N5201 Erectile dysfunction due to arterial insufficiency: Secondary | ICD-10-CM | POA: Diagnosis not present

## 2020-02-06 DIAGNOSIS — N202 Calculus of kidney with calculus of ureter: Secondary | ICD-10-CM | POA: Diagnosis not present

## 2020-02-10 ENCOUNTER — Other Ambulatory Visit: Payer: Self-pay | Admitting: Urology

## 2020-02-10 DIAGNOSIS — N201 Calculus of ureter: Secondary | ICD-10-CM

## 2020-02-13 ENCOUNTER — Other Ambulatory Visit (HOSPITAL_COMMUNITY): Admission: RE | Admit: 2020-02-13 | Payer: Medicare HMO | Source: Ambulatory Visit

## 2020-02-14 ENCOUNTER — Other Ambulatory Visit (HOSPITAL_COMMUNITY)
Admission: RE | Admit: 2020-02-14 | Discharge: 2020-02-14 | Disposition: A | Discharge status: Home / Self Care | Payer: Medicare HMO | Source: Ambulatory Visit | Attending: Urology | Admitting: Urology

## 2020-02-14 DIAGNOSIS — Z01812 Encounter for preprocedural laboratory examination: Secondary | ICD-10-CM | POA: Diagnosis not present

## 2020-02-14 DIAGNOSIS — Z20822 Contact with and (suspected) exposure to covid-19: Secondary | ICD-10-CM | POA: Insufficient documentation

## 2020-02-14 LAB — SARS CORONAVIRUS 2 (TAT 6-24 HRS): SARS Coronavirus 2: NEGATIVE

## 2020-02-16 NOTE — H&P (Signed)
I have kidney stones.  HPI: David Vargas is a 69 year-old male established patient who is here for renal calculi.  The problem is on the left side. He first stated noticing pain on 10/19/2019. This is not his first kidney stone. His first stone was approximately 04/26/2011. He has had 4 stones prior to getting this one. He is currently having back pain, groin pain, nausea, and vomiting. He denies having flank pain, fever, and chills. He has caught a stone in his urine strainer since his symptoms began.   He has never had surgical treatment for calculi in the past.   David Vargas had another episode of flank pain about 3 weeks ago on the right and took tamsulosin and hydrated and passed the stone. He is currently symptom free. His IPSS is 7. His UA is clear.     ALLERGIES: No Allergies    MEDICATIONS: Aspirin 81 MG TABS Oral  Famotidine  Ibuprofen 800 mg tablet 0 Oral  Melatonin 1 PO Q HS  Tadalafil 5 mg tablet 1 tablet PO Daily PRN  Tamsulosin HCl - 0.4 MG Oral Capsule 0.4 mg 1 po QPM  Turmeric  Tylenol Pm Extra Strength     GU PSH: No GU PSH    NON-GU PSH: Incisional hernia repair (laparoscopic) Knee Arthroscopy - 2012 Knee replacement, Left - about 11/25/2017 Rotator Cuff Surgery.. - 2008     GU PMH: Ureteral calculus - 11/04/2019, - 10/21/2019 (Stable), Urine strainer given. , - 09-19-17, Ureteral Stone, - 2014 ED due to arterial insufficiency (Worsening), I have refilled the tadalafil and discussed FirmEST. - 03/22/2018 Elevated PSA, His PSA is newly elevated to over 4. He is currently on a course of Cipro so I will just wait until Dr. Rex Kras gets a repeat in a couple of months before acting on that finding. - 09-19-2017 Gross hematuria, He has symptoms suggestive a right ureteral stone. I will have him return in 3 weeks with a KUB if he continues to have symptoms and the KUB isn't clear as to the cause, he will need a CT. - 2017/09/19, (Improving), He has passed 2 stones and the hematuria  has cleared. He has residual bilateral renal stones on CT. , - 2018 (Acute), Most likely related to bladder stone. , - 2018 Renal cyst (Stable, Chronic), Left, Stable left renal cyst noted on previous CT 2013. - 2018 Renal calculus, Nephrolithiasis - 2016 History of urolithiasis, Nephrolithiasis - 2014      PMH Notes: .   NON-GU PMH: Encounter for general adult medical examination without abnormal findings, Encounter for preventive health examination - 2015 Arrhythmia, Rhythm Disorder - 2014 Personal history of other diseases of the circulatory system, History of hypertension - 2014 Personal history of other diseases of the digestive system, History of esophageal reflux - 2014 Personal history of other endocrine, nutritional and metabolic disease, History of hypercholesterolemia - 2014 GERD Malignant melanoma of skin, unspecified    FAMILY HISTORY: Acute Myocardial Infarction - Mother Death In The Family Brother - Brother, Runs in Family Death In The Family Father - Runs In Family Death In The Family Mother - Runs In Family Family Health Status Number - Runs In Family liver cancer - Father multiple sclerosis - Brother    Notes: mother age: 60 MI  father age: 71 liver cancer   SOCIAL HISTORY: Marital Status: Married Preferred Language: English; Ethnicity: Not Hispanic Or Latino; Race: White Current Smoking Status: Patient has never smoked.   Tobacco Use Assessment Completed: Used  Tobacco in last 30 days? Does not use smokeless tobacco. Drinks 2 drinks per day.  Does not use drugs. Drinks 2 caffeinated drinks per day. Has not had a blood transfusion. Patient's occupation is/was retired.    REVIEW OF SYSTEMS:    GU Review Male:   Patient denies frequent urination, hard to postpone urination, burning/ pain with urination, get up at night to urinate, leakage of urine, stream starts and stops, trouble starting your stream, have to strain to urinate , erection problems, and penile  pain.  Gastrointestinal (Upper):   Patient denies nausea, vomiting, and indigestion/ heartburn.  Gastrointestinal (Lower):   Patient denies constipation and diarrhea.  Constitutional:   Patient denies fever, night sweats, weight loss, and fatigue.  Skin:   Patient denies skin rash/ lesion and itching.  Eyes:   Patient denies blurred vision and double vision.  Ears/ Nose/ Throat:   Patient denies sore throat and sinus problems.  Hematologic/Lymphatic:   Patient denies swollen glands and easy bruising.  Cardiovascular:   Patient denies leg swelling and chest pains.  Respiratory:   Patient denies cough and shortness of breath.  Endocrine:   Patient denies excessive thirst.  Musculoskeletal:   Patient denies back pain and joint pain.  Neurological:   Patient denies headaches and dizziness.  Psychologic:   Patient denies depression and anxiety.   VITAL SIGNS:      02/06/2020 02:11 PM  BP 135/73 mmHg  Pulse 75 /min  Temperature 97.5 F / 36.3 C   PAST DATA REVIEWED:  Source Of History:  Patient  Records Review:   AUA Symptom Score, Previous Patient Records  Urine Test Review:   Urinalysis  X-Ray Review: KUB: Reviewed Films. Discussed With Patient.     02/21/19 06/19/18 03/13/18 12/21/17 06/23/17 04/06/17 02/28/17 02/24/16  PSA  Total PSA 3.96 ng/mL 4.13 ng/mL 3.66 ng/mL 4.05 ng/mL 4.36 ng/dl 3.03 ng/dl 3.34 ng/dl 1.96 ng/dl  Free PSA  1.11 ng/mL        % Free PSA  27 % PSA          PROCEDURES:         KUB - 74018  A single view of the abdomen is obtained. There is a 5-46m right proximal stone that was in the mid pole on a prior KUB. He has otherwise stable RLP stones and left renal stones. He has lumbar DDD with spurring but no gas or soft tissue abnormalities. he has right pelvic phleboliths.       Patient confirmed No Neulasta OnPro Device.           Urinalysis Dipstick Dipstick Cont'd  Color: Yellow Bilirubin: Neg mg/dL  Appearance: Clear Ketones: Neg mg/dL  Specific  Gravity: 1.020 Blood: Neg ery/uL  pH: <=5.0 Protein: Neg mg/dL  Glucose: Neg mg/dL Urobilinogen: 0.2 mg/dL    Nitrites: Neg    Leukocyte Esterase: Neg leu/uL    ASSESSMENT:      ICD-10 Details  1 GU:   Ureteral calculus - N20.1 Acute, Systemic Symptoms - He has a 624mright proximal stone. I discussed MET, ESWL and URS and he would like to get set up for ESWL in a week or two. I reviewed the risks of ESWL including bleeding, infection, injury to the kidney or adjacent structures, failure to fragment the stone, need for ancillary procedures, thrombotic events, cardiac arrhythmias and sedation complications.   2   Renal calculus - N20.0 Chronic, Stable - He has otherwise stable bilateral renal stones.  3   ED due to arterial insufficiency - N52.01 Chronic, Stable - He has persistent ED and requests a refill on tadalafil.   4   Elevated PSA - R97.20 Chronic, Stable - He has a history of an elevated PSA and will have that done today.      PLAN:            Medications New Meds: Hydrocodone-Acetaminophen 5 mg-325 mg tablet 1 tablet PO Q 6 H PRN   #15  0 Refill(s)    Refill Meds: Tadalafil 5 mg tablet 1 tablet PO Daily PRN   #90  3 Refill(s)            Orders Labs PSA with Reflex, BMP          Schedule Return Visit/Planned Activity: Next Available Appointment - Schedule Surgery             Note: 02/17/20 ESWL.  I have kidney stones.  HPI: David Vargas is a 69 year-old male established patient who is here for renal calculi.  The problem is on the left side. He first stated noticing pain on 10/19/2019. This is not his first kidney stone. His first stone was approximately 04/26/2011. He has had 4 stones prior to getting this one. He is currently having back pain, groin pain, nausea, and vomiting. He denies having flank pain, fever, and chills. He has caught a stone in his urine strainer since his symptoms began.   He has never had surgical treatment for calculi in the past.   Daequan had  another episode of flank pain about 3 weeks ago on the right and took tamsulosin and hydrated and passed the stone. He is currently symptom free. His IPSS is 7. His UA is clear.     ALLERGIES: No Allergies    MEDICATIONS: Aspirin 81 MG TABS Oral  Famotidine  Ibuprofen 800 mg tablet 0 Oral  Melatonin 1 PO Q HS  Tadalafil 5 mg tablet 1 tablet PO Daily PRN  Tamsulosin HCl - 0.4 MG Oral Capsule 0.4 mg 1 po QPM  Turmeric  Tylenol Pm Extra Strength     GU PSH: No GU PSH    NON-GU PSH: Incisional hernia repair (laparoscopic) Knee Arthroscopy - 2012 Knee replacement, Left - about 11/25/2017 Rotator Cuff Surgery.. - 2008     GU PMH: Ureteral calculus - 11/04/2019, - 10/21/2019 (Stable), Urine strainer given. , - Oct 11, 2017, Ureteral Stone, - 2014 ED due to arterial insufficiency (Worsening), I have refilled the tadalafil and discussed FirmEST. - 03/22/2018 Elevated PSA, His PSA is newly elevated to over 4. He is currently on a course of Cipro so I will just wait until Dr. Rex Kras gets a repeat in a couple of months before acting on that finding. - 10/11/2017 Gross hematuria, He has symptoms suggestive a right ureteral stone. I will have him return in 3 weeks with a KUB if he continues to have symptoms and the KUB isn't clear as to the cause, he will need a CT. - 10-11-2017, (Improving), He has passed 2 stones and the hematuria has cleared. He has residual bilateral renal stones on CT. , - 2018 (Acute), Most likely related to bladder stone. , - 2018 Renal cyst (Stable, Chronic), Left, Stable left renal cyst noted on previous CT 2013. - 2018 Renal calculus, Nephrolithiasis - 2016 History of urolithiasis, Nephrolithiasis - 2014      PMH Notes: .   NON-GU PMH: Encounter for general adult medical examination without abnormal findings, Encounter  for preventive health examination - 2015 Arrhythmia, Rhythm Disorder - 2014 Personal history of other diseases of the circulatory system, History of  hypertension - 2014 Personal history of other diseases of the digestive system, History of esophageal reflux - 2014 Personal history of other endocrine, nutritional and metabolic disease, History of hypercholesterolemia - 2014 GERD Malignant melanoma of skin, unspecified    FAMILY HISTORY: Acute Myocardial Infarction - Mother Death In The Family Brother - Brother, Runs in Family Death In The Family Father - [place] In Family Death In The Family Mother - Runs In Family Family Health Status Number - Runs In Family liver cancer - Father multiple sclerosis - Brother    Notes: mother age: 67 MI  father age: 1 liver cancer   SOCIAL HISTORY: Marital Status: Married Preferred Language: English; Ethnicity: Not Hispanic Or Latino; Race: White Current Smoking Status: Patient has never smoked.   Tobacco Use Assessment Completed: Used Tobacco in last 30 days? Does not use smokeless tobacco. Drinks 2 drinks per day.  Does not use drugs. Drinks 2 caffeinated drinks per day. Has not had a blood transfusion. Patient's occupation is/was retired.    REVIEW OF SYSTEMS:    GU Review Male:   Patient denies frequent urination, hard to postpone urination, burning/ pain with urination, get up at night to urinate, leakage of urine, stream starts and stops, trouble starting your stream, have to strain to urinate , erection problems, and penile pain.  Gastrointestinal (Upper):   Patient denies nausea, vomiting, and indigestion/ heartburn.  Gastrointestinal (Lower):   Patient denies constipation and diarrhea.  Constitutional:   Patient denies fever, night sweats, weight loss, and fatigue.  Skin:   Patient denies skin rash/ lesion and itching.  Eyes:   Patient denies blurred vision and double vision.  Ears/ Nose/ Throat:   Patient denies sore throat and sinus problems.  Hematologic/Lymphatic:   Patient denies swollen glands and easy bruising.  Cardiovascular:   Patient denies leg swelling and chest pains.   Respiratory:   Patient denies cough and shortness of breath.  Endocrine:   Patient denies excessive thirst.  Musculoskeletal:   Patient denies back pain and joint pain.  Neurological:   Patient denies headaches and dizziness.  Psychologic:   Patient denies depression and anxiety.   VITAL SIGNS:      02/06/2020 02:11 PM  BP 135/73 mmHg  Pulse 75 /min  Temperature 97.5 F / 36.3 C   PAST DATA REVIEWED:  Source Of History:  Patient  Records Review:   AUA Symptom Score, Previous Patient Records  Urine Test Review:   Urinalysis  X-Ray Review: KUB: Reviewed Films. Discussed With Patient.     02/21/19 06/19/18 03/13/18 12/21/17 06/23/17 04/06/17 02/28/17 02/24/16  PSA  Total PSA 3.96 ng/mL 4.13 ng/mL 3.66 ng/mL 4.05 ng/mL 4.36 ng/dl 3.03 ng/dl 3.34 ng/dl 1.96 ng/dl  Free PSA  1.11 ng/mL        % Free PSA  27 % PSA          PROCEDURES:         KUB - 74018  A single view of the abdomen is obtained. There is a 5-7m right proximal stone that was in the mid pole on a prior KUB. He has otherwise stable RLP stones and left renal stones. He has lumbar DDD with spurring but no gas or soft tissue abnormalities. he has right pelvic phleboliths.       Patient confirmed No Neulasta OnPro Device.  Urinalysis Dipstick Dipstick Cont'd  Color: Yellow Bilirubin: Neg mg/dL  Appearance: Clear Ketones: Neg mg/dL  Specific Gravity: 1.020 Blood: Neg ery/uL  pH: <=5.0 Protein: Neg mg/dL  Glucose: Neg mg/dL Urobilinogen: 0.2 mg/dL    Nitrites: Neg    Leukocyte Esterase: Neg leu/uL    ASSESSMENT:      ICD-10 Details  1 GU:   Ureteral calculus - N20.1 Acute, Systemic Symptoms - He has a 64m right proximal stone. I discussed MET, ESWL and URS and he would like to get set up for ESWL in a week or two. I reviewed the risks of ESWL including bleeding, infection, injury to the kidney or adjacent structures, failure to fragment the stone, need for ancillary procedures, thrombotic events, cardiac  arrhythmias and sedation complications.   2   Renal calculus - N20.0 Chronic, Stable - He has otherwise stable bilateral renal stones.   3   ED due to arterial insufficiency - N52.01 Chronic, Stable - He has persistent ED and requests a refill on tadalafil.   4   Elevated PSA - R97.20 Chronic, Stable - He has a history of an elevated PSA and will have that done today.      PLAN:            Medications New Meds: Hydrocodone-Acetaminophen 5 mg-325 mg tablet 1 tablet PO Q 6 H PRN   #15  0 Refill(s)    Refill Meds: Tadalafil 5 mg tablet 1 tablet PO Daily PRN   #90  3 Refill(s)            Orders Labs PSA with Reflex, BMP          Schedule Return Visit/Planned Activity: Next Available Appointment - Schedule Surgery             Note: 02/17/20 ESWL.

## 2020-02-17 ENCOUNTER — Encounter (HOSPITAL_BASED_OUTPATIENT_CLINIC_OR_DEPARTMENT_OTHER): Payer: Self-pay | Admitting: Urology

## 2020-02-17 ENCOUNTER — Encounter (HOSPITAL_BASED_OUTPATIENT_CLINIC_OR_DEPARTMENT_OTHER)
Admission: RE | Disposition: A | Discharge status: Home / Self Care | Payer: Self-pay | Source: Home / Self Care | Attending: Urology

## 2020-02-17 ENCOUNTER — Ambulatory Visit (HOSPITAL_COMMUNITY): Payer: Medicare HMO

## 2020-02-17 ENCOUNTER — Ambulatory Visit (HOSPITAL_BASED_OUTPATIENT_CLINIC_OR_DEPARTMENT_OTHER)
Admission: RE | Admit: 2020-02-17 | Discharge: 2020-02-17 | Disposition: A | Discharge status: Home / Self Care | Payer: Medicare HMO | Attending: Urology | Admitting: Urology

## 2020-02-17 DIAGNOSIS — Z7982 Long term (current) use of aspirin: Secondary | ICD-10-CM | POA: Insufficient documentation

## 2020-02-17 DIAGNOSIS — Z87442 Personal history of urinary calculi: Secondary | ICD-10-CM | POA: Insufficient documentation

## 2020-02-17 DIAGNOSIS — I771 Stricture of artery: Secondary | ICD-10-CM | POA: Insufficient documentation

## 2020-02-17 DIAGNOSIS — N200 Calculus of kidney: Secondary | ICD-10-CM | POA: Diagnosis not present

## 2020-02-17 DIAGNOSIS — N202 Calculus of kidney with calculus of ureter: Secondary | ICD-10-CM | POA: Diagnosis present

## 2020-02-17 DIAGNOSIS — N201 Calculus of ureter: Secondary | ICD-10-CM

## 2020-02-17 DIAGNOSIS — Z79899 Other long term (current) drug therapy: Secondary | ICD-10-CM | POA: Insufficient documentation

## 2020-02-17 HISTORY — PX: EXTRACORPOREAL SHOCK WAVE LITHOTRIPSY: SHX1557

## 2020-02-17 SURGERY — LITHOTRIPSY, ESWL
Anesthesia: LOCAL | Laterality: Right

## 2020-02-17 MED ORDER — SODIUM CHLORIDE 0.9% FLUSH
3.0000 mL | INTRAVENOUS | Status: DC | PRN
  Filled 2020-02-17: qty 3

## 2020-02-17 MED ORDER — CIPROFLOXACIN HCL 500 MG PO TABS
ORAL_TABLET | ORAL | Status: AC
  Filled 2020-02-17: qty 1

## 2020-02-17 MED ORDER — ONDANSETRON HCL 4 MG/2ML IJ SOLN
4.0000 mg | Freq: Four times a day (QID) | INTRAMUSCULAR | Status: DC | PRN
  Administered 2020-02-17: 4 mg via INTRAVENOUS
  Filled 2020-02-17: qty 2

## 2020-02-17 MED ORDER — SODIUM CHLORIDE 0.9% FLUSH
3.0000 mL | Freq: Two times a day (BID) | INTRAVENOUS | Status: DC
  Filled 2020-02-17: qty 3

## 2020-02-17 MED ORDER — OXYCODONE HCL 5 MG PO TABS
5.0000 mg | ORAL_TABLET | ORAL | Status: DC | PRN
  Filled 2020-02-17: qty 2

## 2020-02-17 MED ORDER — ACETAMINOPHEN 325 MG PO TABS
ORAL_TABLET | ORAL | Status: AC
  Filled 2020-02-17: qty 2

## 2020-02-17 MED ORDER — ONDANSETRON HCL 4 MG/2ML IJ SOLN
INTRAMUSCULAR | Status: AC
  Filled 2020-02-17: qty 2

## 2020-02-17 MED ORDER — SODIUM CHLORIDE 0.9 % IV SOLN
INTRAVENOUS | Status: DC
  Filled 2020-02-17: qty 1000

## 2020-02-17 MED ORDER — CIPROFLOXACIN HCL 500 MG PO TABS
500.0000 mg | ORAL_TABLET | ORAL | Status: AC
  Administered 2020-02-17: 500 mg via ORAL
  Filled 2020-02-17: qty 1

## 2020-02-17 MED ORDER — DIPHENHYDRAMINE HCL 25 MG PO CAPS
25.0000 mg | ORAL_CAPSULE | ORAL | Status: AC
  Administered 2020-02-17: 25 mg via ORAL
  Filled 2020-02-17: qty 1

## 2020-02-17 MED ORDER — MORPHINE SULFATE (PF) 2 MG/ML IV SOLN
2.0000 mg | INTRAVENOUS | Status: DC | PRN
  Filled 2020-02-17: qty 1

## 2020-02-17 MED ORDER — DIAZEPAM 5 MG PO TABS
ORAL_TABLET | ORAL | Status: AC
  Filled 2020-02-17: qty 2

## 2020-02-17 MED ORDER — DIPHENHYDRAMINE HCL 25 MG PO CAPS
ORAL_CAPSULE | ORAL | Status: AC
  Filled 2020-02-17: qty 1

## 2020-02-17 MED ORDER — ACETAMINOPHEN 325 MG PO TABS
650.0000 mg | ORAL_TABLET | ORAL | Status: DC | PRN
  Administered 2020-02-17: 14:00:00 650 mg via ORAL
  Filled 2020-02-17: qty 2

## 2020-02-17 MED ORDER — ACETAMINOPHEN 650 MG RE SUPP
650.0000 mg | RECTAL | Status: DC | PRN
  Filled 2020-02-17: qty 1

## 2020-02-17 MED ORDER — DIAZEPAM 5 MG PO TABS
10.0000 mg | ORAL_TABLET | ORAL | Status: AC
  Administered 2020-02-17: 10:00:00 10 mg via ORAL
  Filled 2020-02-17: qty 2

## 2020-02-17 MED ORDER — SODIUM CHLORIDE 0.9 % IV SOLN
250.0000 mL | INTRAVENOUS | Status: DC | PRN
  Filled 2020-02-17: qty 250

## 2020-02-17 NOTE — Interval H&P Note (Signed)
History and Physical Interval Note:  He continues to have some back pain.   The stone appears to have moved to the right UVJ.   02/17/2020 11:34 AM  David Vargas  has presented today for surgery, with the diagnosis of RIGHT PROXIMAL STONE.  The various methods of treatment have been discussed with the patient and family. After consideration of risks, benefits and other options for treatment, the patient has consented to  Procedure(s): EXTRACORPOREAL SHOCK WAVE LITHOTRIPSY (ESWL) (Right) as a surgical intervention.  The patient's history has been reviewed, patient examined, no change in status, stable for surgery.  I have reviewed the patient's chart and labs.  Questions were answered to the patient's satisfaction.     Irine Seal

## 2020-02-17 NOTE — Discharge Instructions (Signed)
Lithotripsy, Care After This sheet gives you information about how to care for yourself after your procedure. Your health care provider may also give you more specific instructions. If you have problems or questions, contact your health care provider. What can I expect after the procedure? After the procedure, it is common to have:  Some blood in your urine. This should only last for a few days.  Soreness in your back, sides, or upper abdomen for a few days.  Blotches or bruises on your back where the pressure wave entered the skin.  Pain, discomfort, or nausea when pieces (fragments) of the kidney stone move through the tube that carries urine from the kidney to the bladder (ureter). Stone fragments may pass soon after the procedure, but they may continue to pass for up to 4-8 weeks. ? If you have severe pain or nausea, contact your health care provider. This may be caused by a large stone that was not broken up, and this may mean that you need more treatment.  Some pain or discomfort during urination.  Some pain or discomfort in the lower abdomen or (in men) at the base of the penis. Follow these instructions at home: Medicines  Take over-the-counter and prescription medicines only as told by your health care provider.  If you were prescribed an antibiotic medicine, take it as told by your health care provider. Do not stop taking the antibiotic even if you start to feel better.  Do not drive for 24 hours if you were given a medicine to help you relax (sedative).  Do not drive or use heavy machinery while taking prescription pain medicine. Eating and drinking      Drink enough water and fluids to keep your urine clear or pale yellow. This helps any remaining pieces of the stone to pass. It can also help prevent new stones from forming.  Eat plenty of fresh fruits and vegetables.  Follow instructions from your health care provider about eating and drinking restrictions. You may be  instructed: ? To reduce how much salt (sodium) you eat or drink. Check ingredients and nutrition facts on packaged foods and beverages. ? To reduce how much meat you eat.  Eat the recommended amount of calcium for your age and gender. Ask your health care provider how much calcium you should have. General instructions  Get plenty of rest.  Most people can resume normal activities 1-2 days after the procedure. Ask your health care provider what activities are safe for you.  Your health care provider may direct you to lie in a certain position (postural drainage) and tap firmly (percuss) over your kidney area to help stone fragments pass. Follow instructions as told by your health care provider.  If directed, strain all urine through the strainer that was provided by your health care provider. ? Keep all fragments for your health care provider to see. Any stones that are found may be sent to a medical lab for examination. The stone may be as small as a grain of salt.  Keep all follow-up visits as told by your health care provider. This is important. Contact a health care provider if:  You have pain that is severe or does not get better with medicine.  You have nausea that is severe or does not go away.  You have blood in your urine longer than your health care provider told you to expect.  You have more blood in your urine.  You have pain during urination that does   not go away.  You urinate more frequently than usual and this does not go away.  You develop a rash or any other possible signs of an allergic reaction. Get help right away if:  You have severe pain in your back, sides, or upper abdomen.  You have severe pain while urinating.  Your urine is very dark red.  You have blood in your stool (feces).  You cannot pass any urine at all.  You feel a strong urge to urinate after emptying your bladder.  You have a fever or chills.  You develop shortness of breath,  difficulty breathing, or chest pain.  You have severe nausea that leads to persistent vomiting.  You faint. Summary  After this procedure, it is common to have some pain, discomfort, or nausea when pieces (fragments) of the kidney stone move through the tube that carries urine from the kidney to the bladder (ureter). If this pain or nausea is severe, however, you should contact your health care provider.  Most people can resume normal activities 1-2 days after the procedure. Ask your health care provider what activities are safe for you.  Drink enough water and fluids to keep your urine clear or pale yellow. This helps any remaining pieces of the stone to pass, and it can help prevent new stones from forming.  If directed, strain your urine and keep all fragments for your health care provider to see. Fragments or stones may be as small as a grain of salt.  Get help right away if you have severe pain in your back, sides, or upper abdomen or have severe pain while urinating. This information is not intended to replace advice given to you by your health care provider. Make sure you discuss any questions you have with your health care provider. Document Revised: 03/25/2019 Document Reviewed: 11/02/2016 Elsevier Patient Education  [place] Instructions  Activity: Get plenty of rest for the remainder of the day. A responsible individual must stay with you for 24 hours following the procedure.  For the next 24 hours, DO NOT: -Drive a car -Paediatric nurse -Drink alcoholic beverages -Take any medication unless instructed by your physician -Make any legal decisions or sign important papers.  Meals: Start with liquid foods such as gelatin or soup. Progress to regular foods as tolerated. Avoid greasy, spicy, heavy foods. If nausea and/or vomiting occur, drink only clear liquids until the nausea and/or vomiting subsides. Call your physician if vomiting  continues.  Special Instructions/Symptoms: Your throat may feel dry or sore from the anesthesia or the breathing tube placed in your throat during surgery. If this causes discomfort, gargle with warm salt water. The discomfort should disappear within 24 hours.

## 2020-02-20 ENCOUNTER — Ambulatory Visit: Payer: Medicare HMO | Attending: Internal Medicine

## 2020-02-20 DIAGNOSIS — Z20822 Contact with and (suspected) exposure to covid-19: Secondary | ICD-10-CM | POA: Diagnosis not present

## 2020-02-21 LAB — NOVEL CORONAVIRUS, NAA: SARS-CoV-2, NAA: NOT DETECTED

## 2020-02-25 ENCOUNTER — Ambulatory Visit: Payer: Medicare HMO | Attending: Internal Medicine

## 2020-02-25 DIAGNOSIS — I480 Paroxysmal atrial fibrillation: Secondary | ICD-10-CM | POA: Diagnosis not present

## 2020-02-25 DIAGNOSIS — I10 Essential (primary) hypertension: Secondary | ICD-10-CM | POA: Diagnosis not present

## 2020-02-25 DIAGNOSIS — J432 Centrilobular emphysema: Secondary | ICD-10-CM | POA: Diagnosis not present

## 2020-02-25 DIAGNOSIS — Z23 Encounter for immunization: Secondary | ICD-10-CM | POA: Insufficient documentation

## 2020-02-25 NOTE — Progress Notes (Signed)
   Covid-19 Vaccination Clinic  Name:  David Vargas    MRN: [id number] DOB: July 01, 1951  02/25/2020  [name] was observed post Covid-19 immunization for 15 minutes without incident. He was provided with Vaccine Information Sheet and instruction to access the V-Safe system.   [name] was instructed to call 911 with any severe reactions post vaccine: Marland Kitchen Difficulty breathing  . Swelling of face and throat  . A fast heartbeat  . A bad rash all over body  . Dizziness and weakness   Immunizations Administered    Name Date Dose VIS Date Route   Pfizer COVID-19 Vaccine 02/25/2020 10:03 AM 0.3 mL 12/06/2019 Intramuscular   Manufacturer: [place]   Lot: [id number]   [place]: [id number]

## 2020-03-02 DIAGNOSIS — N202 Calculus of kidney with calculus of ureter: Secondary | ICD-10-CM | POA: Diagnosis not present

## 2020-03-30 DIAGNOSIS — N202 Calculus of kidney with calculus of ureter: Secondary | ICD-10-CM | POA: Diagnosis not present

## 2020-04-09 DIAGNOSIS — Z85820 Personal history of malignant melanoma of skin: Secondary | ICD-10-CM | POA: Diagnosis not present

## 2020-04-09 DIAGNOSIS — D485 Neoplasm of uncertain behavior of skin: Secondary | ICD-10-CM | POA: Diagnosis not present

## 2020-04-09 DIAGNOSIS — D225 Melanocytic nevi of trunk: Secondary | ICD-10-CM | POA: Diagnosis not present

## 2020-04-09 DIAGNOSIS — L821 Other seborrheic keratosis: Secondary | ICD-10-CM | POA: Diagnosis not present

## 2020-04-09 DIAGNOSIS — C44319 Basal cell carcinoma of skin of other parts of face: Secondary | ICD-10-CM | POA: Diagnosis not present

## 2020-04-09 DIAGNOSIS — D2371 Other benign neoplasm of skin of right lower limb, including hip: Secondary | ICD-10-CM | POA: Diagnosis not present

## 2020-04-09 DIAGNOSIS — Z85828 Personal history of other malignant neoplasm of skin: Secondary | ICD-10-CM | POA: Diagnosis not present

## 2020-04-09 DIAGNOSIS — L723 Sebaceous cyst: Secondary | ICD-10-CM | POA: Diagnosis not present

## 2020-04-09 DIAGNOSIS — L281 Prurigo nodularis: Secondary | ICD-10-CM | POA: Diagnosis not present

## 2020-04-09 DIAGNOSIS — D2239 Melanocytic nevi of other parts of face: Secondary | ICD-10-CM | POA: Diagnosis not present

## 2020-05-12 DIAGNOSIS — H524 Presbyopia: Secondary | ICD-10-CM | POA: Diagnosis not present

## 2020-05-14 DIAGNOSIS — J432 Centrilobular emphysema: Secondary | ICD-10-CM | POA: Diagnosis not present

## 2020-05-14 DIAGNOSIS — I480 Paroxysmal atrial fibrillation: Secondary | ICD-10-CM | POA: Diagnosis not present

## 2020-05-14 DIAGNOSIS — I10 Essential (primary) hypertension: Secondary | ICD-10-CM | POA: Diagnosis not present

## 2020-05-27 DIAGNOSIS — M25511 Pain in right shoulder: Secondary | ICD-10-CM | POA: Diagnosis not present

## 2020-06-04 DIAGNOSIS — R972 Elevated prostate specific antigen [PSA]: Secondary | ICD-10-CM | POA: Diagnosis not present

## 2020-06-04 DIAGNOSIS — I10 Essential (primary) hypertension: Secondary | ICD-10-CM | POA: Diagnosis not present

## 2020-06-10 DIAGNOSIS — R972 Elevated prostate specific antigen [PSA]: Secondary | ICD-10-CM | POA: Diagnosis not present

## 2020-06-10 DIAGNOSIS — N200 Calculus of kidney: Secondary | ICD-10-CM | POA: Diagnosis not present

## 2020-06-11 DIAGNOSIS — E041 Nontoxic single thyroid nodule: Secondary | ICD-10-CM | POA: Diagnosis not present

## 2020-06-11 DIAGNOSIS — Z85820 Personal history of malignant melanoma of skin: Secondary | ICD-10-CM | POA: Diagnosis not present

## 2020-06-11 DIAGNOSIS — Z86010 Personal history of colonic polyps: Secondary | ICD-10-CM | POA: Diagnosis not present

## 2020-06-11 DIAGNOSIS — N529 Male erectile dysfunction, unspecified: Secondary | ICD-10-CM | POA: Diagnosis not present

## 2020-06-11 DIAGNOSIS — R918 Other nonspecific abnormal finding of lung field: Secondary | ICD-10-CM | POA: Diagnosis not present

## 2020-06-11 DIAGNOSIS — Z0000 Encounter for general adult medical examination without abnormal findings: Secondary | ICD-10-CM | POA: Diagnosis not present

## 2020-06-11 DIAGNOSIS — R972 Elevated prostate specific antigen [PSA]: Secondary | ICD-10-CM | POA: Diagnosis not present

## 2020-06-11 DIAGNOSIS — Z87442 Personal history of urinary calculi: Secondary | ICD-10-CM | POA: Diagnosis not present

## 2020-06-11 DIAGNOSIS — I77810 Thoracic aortic ectasia: Secondary | ICD-10-CM | POA: Diagnosis not present

## 2020-06-11 DIAGNOSIS — I10 Essential (primary) hypertension: Secondary | ICD-10-CM | POA: Diagnosis not present

## 2020-06-16 ENCOUNTER — Other Ambulatory Visit: Payer: Self-pay | Admitting: Family Medicine

## 2020-06-16 ENCOUNTER — Other Ambulatory Visit (HOSPITAL_COMMUNITY): Payer: Self-pay | Admitting: Family Medicine

## 2020-06-16 DIAGNOSIS — R918 Other nonspecific abnormal finding of lung field: Secondary | ICD-10-CM

## 2020-07-08 ENCOUNTER — Ambulatory Visit (HOSPITAL_COMMUNITY)
Admission: RE | Admit: 2020-07-08 | Discharge: 2020-07-08 | Disposition: A | Discharge status: Home / Self Care | Payer: Medicare HMO | Source: Ambulatory Visit | Attending: Family Medicine | Admitting: Family Medicine

## 2020-07-08 ENCOUNTER — Other Ambulatory Visit: Payer: Self-pay

## 2020-07-08 DIAGNOSIS — I2510 Atherosclerotic heart disease of native coronary artery without angina pectoris: Secondary | ICD-10-CM | POA: Diagnosis not present

## 2020-07-08 DIAGNOSIS — R918 Other nonspecific abnormal finding of lung field: Secondary | ICD-10-CM | POA: Diagnosis not present

## 2020-07-08 DIAGNOSIS — R590 Localized enlarged lymph nodes: Secondary | ICD-10-CM | POA: Diagnosis not present

## 2020-07-08 DIAGNOSIS — I700 Atherosclerosis of aorta: Secondary | ICD-10-CM | POA: Diagnosis not present

## 2020-07-08 LAB — POCT I-STAT CREATININE: Creatinine, Ser: 1 mg/dL (ref 0.61–1.24)

## 2020-07-08 MED ORDER — IOHEXOL 300 MG/ML  SOLN
75.0000 mL | Freq: Once | INTRAMUSCULAR | Status: AC | PRN
  Administered 2020-07-08: 75 mL via INTRAVENOUS

## 2020-07-08 MED ORDER — SODIUM CHLORIDE (PF) 0.9 % IJ SOLN
INTRAMUSCULAR | Status: AC
  Filled 2020-07-08: qty 50

## 2020-07-13 ENCOUNTER — Telehealth: Payer: Self-pay

## 2020-07-13 DIAGNOSIS — E782 Mixed hyperlipidemia: Secondary | ICD-10-CM

## 2020-07-13 DIAGNOSIS — I2510 Atherosclerotic heart disease of native coronary artery without angina pectoris: Secondary | ICD-10-CM

## 2020-07-13 NOTE — Telephone Encounter (Signed)
Sueanne Margarita, MD  Antonieta Iba, RN Chest CT showed coronary artery calcifications and likely has some CAD. Please get a Lexiscan myoview to rule out ischemia and a coronary Ca score.     Spoke with the patient and made him aware of recommendations per Dr. Radford Pax. Orders have been placed. Patient is aware to expect a call from our office to have tests scheduled.

## 2020-07-16 ENCOUNTER — Encounter (HOSPITAL_COMMUNITY): Payer: Self-pay | Admitting: *Deleted

## 2020-07-23 DIAGNOSIS — R69 Illness, unspecified: Secondary | ICD-10-CM | POA: Diagnosis not present

## 2020-07-27 DIAGNOSIS — E782 Mixed hyperlipidemia: Secondary | ICD-10-CM

## 2020-07-28 ENCOUNTER — Other Ambulatory Visit: Payer: Self-pay

## 2020-07-28 ENCOUNTER — Encounter (HOSPITAL_COMMUNITY): Payer: Medicare HMO

## 2020-07-28 ENCOUNTER — Ambulatory Visit (INDEPENDENT_AMBULATORY_CARE_PROVIDER_SITE_OTHER)
Admission: RE | Admit: 2020-07-28 | Discharge: 2020-07-28 | Disposition: A | Discharge status: Home / Self Care | Payer: Self-pay | Source: Ambulatory Visit | Attending: Cardiology | Admitting: Cardiology

## 2020-07-28 ENCOUNTER — Ambulatory Visit (HOSPITAL_COMMUNITY): Payer: Medicare HMO

## 2020-07-28 DIAGNOSIS — I2584 Coronary atherosclerosis due to calcified coronary lesion: Secondary | ICD-10-CM

## 2020-07-28 DIAGNOSIS — E782 Mixed hyperlipidemia: Secondary | ICD-10-CM

## 2020-07-28 DIAGNOSIS — I898 Other specified noninfective disorders of lymphatic vessels and lymph nodes: Secondary | ICD-10-CM | POA: Diagnosis not present

## 2020-07-28 DIAGNOSIS — R918 Other nonspecific abnormal finding of lung field: Secondary | ICD-10-CM | POA: Diagnosis not present

## 2020-07-28 DIAGNOSIS — I2510 Atherosclerotic heart disease of native coronary artery without angina pectoris: Secondary | ICD-10-CM

## 2020-07-28 DIAGNOSIS — D71 Functional disorders of polymorphonuclear neutrophils: Secondary | ICD-10-CM | POA: Diagnosis not present

## 2020-07-28 DIAGNOSIS — I712 Thoracic aortic aneurysm, without rupture: Secondary | ICD-10-CM | POA: Diagnosis not present

## 2020-07-29 ENCOUNTER — Ambulatory Visit (HOSPITAL_COMMUNITY): Payer: Medicare HMO

## 2020-07-29 ENCOUNTER — Telehealth: Payer: Self-pay | Admitting: Cardiology

## 2020-07-29 DIAGNOSIS — I712 Thoracic aortic aneurysm, without rupture, unspecified: Secondary | ICD-10-CM

## 2020-07-29 NOTE — Telephone Encounter (Signed)
Patient returned call for his test results, can leave detail VM.

## 2020-07-29 NOTE — Telephone Encounter (Signed)
"  Sueanne Margarita, MD  Antonieta Iba, RN Stable small pulmonary nodules, 4cm ascending thoracic ascending aortic aneurysm which is small. Repeat Chest CTA in 1 year for followup. Coronary Ca score pending "  Left message for patient with results. Orders placed for repeat CTA in one year

## 2020-07-31 NOTE — Telephone Encounter (Signed)
Given his elevated coronary Ca score I would like his LDL < 70.  Please start Lipitor 20mg  daily and check FLp and ALT in 6 weeks

## 2020-08-03 ENCOUNTER — Telehealth (HOSPITAL_COMMUNITY): Payer: Self-pay | Admitting: *Deleted

## 2020-08-03 DIAGNOSIS — R972 Elevated prostate specific antigen [PSA]: Secondary | ICD-10-CM | POA: Diagnosis not present

## 2020-08-03 DIAGNOSIS — D075 Carcinoma in situ of prostate: Secondary | ICD-10-CM | POA: Diagnosis not present

## 2020-08-03 HISTORY — PX: BIOPSY PROSTATE: PRO28

## 2020-08-03 MED ORDER — ATORVASTATIN CALCIUM 20 MG PO TABS
20.0000 mg | ORAL_TABLET | Freq: Every day | ORAL | 3 refills | Status: DC
Start: 2020-08-03 — End: 2020-08-07

## 2020-08-03 MED ORDER — ATORVASTATIN CALCIUM 20 MG PO TABS
20.0000 mg | ORAL_TABLET | Freq: Every day | ORAL | 3 refills | Status: DC
Start: 2020-08-03 — End: 2020-08-03

## 2020-08-03 NOTE — Telephone Encounter (Signed)
Patient given detailed instructions per Myocardial Perfusion Study Information Sheet for the test on 08/06/20. Patient notified to arrive 15 minutes early and that it is imperative to arrive on time for appointment to keep from having the test rescheduled.  If you need to cancel or reschedule your appointment, please call the office within 24 hours of your appointment. . Patient verbalized understanding. Kirstie Peri

## 2020-08-03 NOTE — Addendum Note (Signed)
Addended by: Antonieta Iba on: 08/03/2020 09:01 AM   Modules accepted: Orders

## 2020-08-04 ENCOUNTER — Telehealth: Payer: Self-pay | Admitting: Cardiology

## 2020-08-04 NOTE — Telephone Encounter (Signed)
Returned call to pt.  He was concerned that he had a Prostate Biopsy yesterday, in which last night was also his 1st dose of Atorvastatin. Pt reporting fever, body aches, vomiting today and pt wanted to make sure the Atorvastatin wasn't the cause of the aches.  Pt was advised that he wouldn't feel any of them side effects from 1 dose of Atorvastatin.  He has already called [facility] where he did his biopsy and they are keeping a close eye on pt, but he was just concerned.  Pt was advised to continue the Atorvastatin and in about 2 weeks if he was still experiencing the body aches, to call us back and we will revisit this.  Pt was sent to Edmon Crape, scheduler, to reschedule his [place] Adam since he was feverish.    Pt was grateful for the call back.

## 2020-08-04 NOTE — Telephone Encounter (Signed)
Patient calling in regards to his mychart message sent today. He states he would prefer a call back with advise.

## 2020-08-05 ENCOUNTER — Other Ambulatory Visit: Payer: Self-pay

## 2020-08-05 ENCOUNTER — Encounter (HOSPITAL_COMMUNITY): Payer: Self-pay | Admitting: Family Medicine

## 2020-08-05 ENCOUNTER — Inpatient Hospital Stay (HOSPITAL_COMMUNITY)
Admission: EM | Admit: 2020-08-05 | Discharge: 2020-08-07 | DRG: 862 | Disposition: A | Discharge status: Home / Self Care | Payer: Medicare HMO | Attending: Internal Medicine | Admitting: Internal Medicine

## 2020-08-05 ENCOUNTER — Emergency Department (HOSPITAL_COMMUNITY): Payer: Medicare HMO

## 2020-08-05 DIAGNOSIS — Z8249 Family history of ischemic heart disease and other diseases of the circulatory system: Secondary | ICD-10-CM

## 2020-08-05 DIAGNOSIS — I491 Atrial premature depolarization: Secondary | ICD-10-CM | POA: Diagnosis not present

## 2020-08-05 DIAGNOSIS — Z79899 Other long term (current) drug therapy: Secondary | ICD-10-CM

## 2020-08-05 DIAGNOSIS — R509 Fever, unspecified: Secondary | ICD-10-CM | POA: Diagnosis present

## 2020-08-05 DIAGNOSIS — T8140XA Infection following a procedure, unspecified, initial encounter: Principal | ICD-10-CM | POA: Diagnosis present

## 2020-08-05 DIAGNOSIS — Z85820 Personal history of malignant melanoma of skin: Secondary | ICD-10-CM

## 2020-08-05 DIAGNOSIS — R3911 Hesitancy of micturition: Secondary | ICD-10-CM | POA: Diagnosis present

## 2020-08-05 DIAGNOSIS — I499 Cardiac arrhythmia, unspecified: Secondary | ICD-10-CM | POA: Diagnosis not present

## 2020-08-05 DIAGNOSIS — K921 Melena: Secondary | ICD-10-CM | POA: Diagnosis present

## 2020-08-05 DIAGNOSIS — R1110 Vomiting, unspecified: Secondary | ICD-10-CM | POA: Diagnosis not present

## 2020-08-05 DIAGNOSIS — R410 Disorientation, unspecified: Secondary | ICD-10-CM | POA: Diagnosis not present

## 2020-08-05 DIAGNOSIS — A4181 Sepsis due to Enterococcus: Secondary | ICD-10-CM | POA: Diagnosis not present

## 2020-08-05 DIAGNOSIS — R0689 Other abnormalities of breathing: Secondary | ICD-10-CM | POA: Diagnosis not present

## 2020-08-05 DIAGNOSIS — D696 Thrombocytopenia, unspecified: Secondary | ICD-10-CM | POA: Diagnosis present

## 2020-08-05 DIAGNOSIS — Z20822 Contact with and (suspected) exposure to covid-19: Secondary | ICD-10-CM | POA: Diagnosis present

## 2020-08-05 DIAGNOSIS — Z72820 Sleep deprivation: Secondary | ICD-10-CM

## 2020-08-05 DIAGNOSIS — R0902 Hypoxemia: Secondary | ICD-10-CM | POA: Diagnosis not present

## 2020-08-05 DIAGNOSIS — R7881 Bacteremia: Secondary | ICD-10-CM | POA: Diagnosis present

## 2020-08-05 DIAGNOSIS — A4151 Sepsis due to Escherichia coli [E. coli]: Secondary | ICD-10-CM | POA: Diagnosis present

## 2020-08-05 DIAGNOSIS — A419 Sepsis, unspecified organism: Secondary | ICD-10-CM | POA: Diagnosis not present

## 2020-08-05 DIAGNOSIS — I10 Essential (primary) hypertension: Secondary | ICD-10-CM | POA: Diagnosis not present

## 2020-08-05 DIAGNOSIS — N289 Disorder of kidney and ureter, unspecified: Secondary | ICD-10-CM | POA: Diagnosis not present

## 2020-08-05 DIAGNOSIS — K219 Gastro-esophageal reflux disease without esophagitis: Secondary | ICD-10-CM | POA: Diagnosis present

## 2020-08-05 DIAGNOSIS — R3915 Urgency of urination: Secondary | ICD-10-CM | POA: Diagnosis not present

## 2020-08-05 DIAGNOSIS — N179 Acute kidney failure, unspecified: Secondary | ICD-10-CM | POA: Diagnosis present

## 2020-08-05 DIAGNOSIS — Z820 Family history of epilepsy and other diseases of the nervous system: Secondary | ICD-10-CM

## 2020-08-05 DIAGNOSIS — R000 Tachycardia, unspecified: Secondary | ICD-10-CM | POA: Diagnosis present

## 2020-08-05 DIAGNOSIS — E785 Hyperlipidemia, unspecified: Secondary | ICD-10-CM | POA: Diagnosis present

## 2020-08-05 DIAGNOSIS — Z87442 Personal history of urinary calculi: Secondary | ICD-10-CM

## 2020-08-05 DIAGNOSIS — B9620 Unspecified Escherichia coli [E. coli] as the cause of diseases classified elsewhere: Secondary | ICD-10-CM | POA: Diagnosis present

## 2020-08-05 DIAGNOSIS — R5081 Fever presenting with conditions classified elsewhere: Secondary | ICD-10-CM | POA: Diagnosis not present

## 2020-08-05 DIAGNOSIS — I480 Paroxysmal atrial fibrillation: Secondary | ICD-10-CM | POA: Diagnosis present

## 2020-08-05 DIAGNOSIS — N390 Urinary tract infection, site not specified: Secondary | ICD-10-CM | POA: Diagnosis present

## 2020-08-05 DIAGNOSIS — Z96652 Presence of left artificial knee joint: Secondary | ICD-10-CM | POA: Diagnosis present

## 2020-08-05 DIAGNOSIS — Z800 Family history of malignant neoplasm of digestive organs: Secondary | ICD-10-CM

## 2020-08-05 DIAGNOSIS — R12 Heartburn: Secondary | ICD-10-CM | POA: Diagnosis not present

## 2020-08-05 DIAGNOSIS — E872 Acidosis: Secondary | ICD-10-CM | POA: Diagnosis present

## 2020-08-05 LAB — HIV ANTIBODY (ROUTINE TESTING W REFLEX): HIV Screen 4th Generation wRfx: NONREACTIVE

## 2020-08-05 LAB — LACTIC ACID, PLASMA
Lactic Acid, Venous: 2 mmol/L (ref 0.5–1.9)
Lactic Acid, Venous: 1.7 mmol/L (ref 0.5–1.9)

## 2020-08-05 LAB — BLOOD CULTURE ID PANEL (REFLEXED) - BCID2

## 2020-08-05 LAB — URINALYSIS, ROUTINE W REFLEX MICROSCOPIC
Bilirubin Urine: NEGATIVE
Glucose, UA: NEGATIVE mg/dL
Ketones, ur: NEGATIVE mg/dL
Nitrite: POSITIVE — AB
Protein, ur: 100 mg/dL — AB
Specific Gravity, Urine: 1.02 (ref 1.005–1.030)
pH: 6 (ref 5.0–8.0)

## 2020-08-05 LAB — CBC WITH DIFFERENTIAL/PLATELET
Abs Immature Granulocytes: 0.05 10*3/uL (ref 0.00–0.07)
Basophils Absolute: 0 10*3/uL (ref 0.0–0.1)
Basophils Relative: 0 %
Eosinophils Absolute: 0 10*3/uL (ref 0.0–0.5)
Eosinophils Relative: 0 %
HCT: 46.4 % (ref 39.0–52.0)
Hemoglobin: 14.9 g/dL (ref 13.0–17.0)
Immature Granulocytes: 1 %
Lymphocytes Relative: 3 %
Lymphs Abs: 0.3 10*3/uL — ABNORMAL LOW (ref 0.7–4.0)
MCH: 29.4 pg (ref 26.0–34.0)
MCHC: 32.1 g/dL (ref 30.0–36.0)
MCV: 91.7 fL (ref 80.0–100.0)
Monocytes Absolute: 0.2 10*3/uL (ref 0.1–1.0)
Monocytes Relative: 2 %
Neutro Abs: 9 10*3/uL — ABNORMAL HIGH (ref 1.7–7.7)
Neutrophils Relative %: 94 %
Platelets: 131 10*3/uL — ABNORMAL LOW (ref 150–400)
RBC: 5.06 MIL/uL (ref 4.22–5.81)
RDW: 13.6 % (ref 11.5–15.5)
WBC: 9.5 10*3/uL (ref 4.0–10.5)
nRBC: 0 % (ref 0.0–0.2)

## 2020-08-05 LAB — COMPREHENSIVE METABOLIC PANEL
ALT: 26 U/L (ref 0–44)
AST: 29 U/L (ref 15–41)
Albumin: 3.5 g/dL (ref 3.5–5.0)
Alkaline Phosphatase: 67 U/L (ref 38–126)
Anion gap: 10 (ref 5–15)
BUN: 20 mg/dL (ref 8–23)
CO2: 25 mmol/L (ref 22–32)
Calcium: 9 mg/dL (ref 8.9–10.3)
Chloride: 103 mmol/L (ref 98–111)
Creatinine, Ser: 1.26 mg/dL — ABNORMAL HIGH (ref 0.61–1.24)
GFR calc Af Amer: >60 mL/min (ref >60)
GFR calc non Af Amer: 57 mL/min — ABNORMAL LOW (ref >60)
Glucose, Bld: 116 mg/dL — ABNORMAL HIGH (ref 70–99)
Potassium: 4.1 mmol/L (ref 3.5–5.1)
Sodium: 138 mmol/L (ref 135–145)
Total Bilirubin: 1.1 mg/dL (ref 0.3–1.2)
Total Protein: 6.3 g/dL — ABNORMAL LOW (ref 6.5–8.1)

## 2020-08-05 LAB — URINALYSIS, MICROSCOPIC (REFLEX): RBC / HPF: >50 RBC/hpf (ref 0–5)

## 2020-08-05 LAB — APTT: aPTT: 29 seconds (ref 24–36)

## 2020-08-05 LAB — SARS CORONAVIRUS 2 BY RT PCR (HOSPITAL ORDER, PERFORMED IN ~~LOC~~ HOSPITAL LAB): SARS Coronavirus 2: NEGATIVE

## 2020-08-05 LAB — PROTIME-INR
INR: 1.2 (ref 0.8–1.2)
Prothrombin Time: 14.9 seconds (ref 11.4–15.2)

## 2020-08-05 MED ORDER — MORPHINE SULFATE (PF) 2 MG/ML IV SOLN: 2.0000 mg | INTRAVENOUS | Status: DC | PRN

## 2020-08-05 MED ORDER — ACETAMINOPHEN 650 MG RE SUPP: 650.0000 mg | Freq: Four times a day (QID) | RECTAL | Status: DC | PRN

## 2020-08-05 MED ORDER — METOCLOPRAMIDE HCL 5 MG/ML IJ SOLN
10.0000 mg | Freq: Once | INTRAMUSCULAR | Status: AC
  Administered 2020-08-05: 10 mg via INTRAVENOUS
  Filled 2020-08-05: qty 2

## 2020-08-05 MED ORDER — ONDANSETRON HCL 4 MG/2ML IJ SOLN
4.0000 mg | Freq: Four times a day (QID) | INTRAMUSCULAR | Status: DC | PRN
  Administered 2020-08-05: 4 mg via INTRAVENOUS
  Filled 2020-08-05: qty 2

## 2020-08-05 MED ORDER — LACTATED RINGERS IV BOLUS (SEPSIS)
1000.0000 mL | Freq: Once | INTRAVENOUS | Status: AC
  Administered 2020-08-05: 1000 mL via INTRAVENOUS

## 2020-08-05 MED ORDER — SODIUM CHLORIDE 0.9 % IV SOLN
1.0000 g | Freq: Three times a day (TID) | INTRAVENOUS | Status: DC
  Administered 2020-08-05 – 2020-08-06 (×3): 1 g via INTRAVENOUS
  Filled 2020-08-05 (×5): qty 1

## 2020-08-05 MED ORDER — OXYCODONE HCL 5 MG PO TABS: 5.0000 mg | ORAL_TABLET | ORAL | Status: DC | PRN

## 2020-08-05 MED ORDER — ONDANSETRON HCL 4 MG PO TABS: 4.0000 mg | ORAL_TABLET | Freq: Four times a day (QID) | ORAL | Status: DC | PRN

## 2020-08-05 MED ORDER — ONDANSETRON HCL 4 MG/2ML IJ SOLN
4.0000 mg | Freq: Once | INTRAMUSCULAR | Status: AC
  Administered 2020-08-05: 4 mg via INTRAVENOUS
  Filled 2020-08-05: qty 2

## 2020-08-05 MED ORDER — SODIUM CHLORIDE 0.9 % IV SOLN
2.0000 g | Freq: Once | INTRAVENOUS | Status: AC
  Administered 2020-08-05: 2 g via INTRAVENOUS
  Filled 2020-08-05: qty 20

## 2020-08-05 MED ORDER — ATORVASTATIN CALCIUM 10 MG PO TABS
20.0000 mg | ORAL_TABLET | Freq: Every day | ORAL | Status: DC
  Administered 2020-08-05 – 2020-08-06 (×2): 20 mg via ORAL
  Filled 2020-08-05 (×2): qty 2

## 2020-08-05 MED ORDER — ACETAMINOPHEN 325 MG PO TABS
650.0000 mg | ORAL_TABLET | Freq: Four times a day (QID) | ORAL | Status: DC | PRN
  Administered 2020-08-05 – 2020-08-06 (×4): 650 mg via ORAL
  Filled 2020-08-05 (×4): qty 2

## 2020-08-05 MED ORDER — POLYETHYLENE GLYCOL 3350 17 G PO PACK
17.0000 g | PACK | Freq: Every day | ORAL | Status: DC | PRN
  Administered 2020-08-06: 17 g via ORAL
  Filled 2020-08-05: qty 1

## 2020-08-05 MED ORDER — IBUPROFEN 400 MG PO TABS
400.0000 mg | ORAL_TABLET | Freq: Once | ORAL | Status: AC
  Administered 2020-08-05: 400 mg via ORAL
  Filled 2020-08-05: qty 1

## 2020-08-05 MED ORDER — DIPHENHYDRAMINE HCL 50 MG/ML IJ SOLN
25.0000 mg | Freq: Once | INTRAMUSCULAR | Status: AC
  Administered 2020-08-05: 25 mg via INTRAVENOUS
  Filled 2020-08-05: qty 1

## 2020-08-05 MED ORDER — MELATONIN 5 MG PO TABS
5.0000 mg | ORAL_TABLET | Freq: Every evening | ORAL | Status: DC | PRN
  Administered 2020-08-05: 5 mg via ORAL
  Filled 2020-08-05 (×3): qty 1

## 2020-08-05 MED ORDER — SODIUM CHLORIDE 0.9 % IV SOLN: 1.0000 g | INTRAVENOUS | Status: DC

## 2020-08-05 MED ORDER — ACETAMINOPHEN 325 MG PO TABS
650.0000 mg | ORAL_TABLET | Freq: Once | ORAL | Status: AC
  Administered 2020-08-05: 650 mg via ORAL
  Filled 2020-08-05: qty 2

## 2020-08-05 MED ORDER — METRONIDAZOLE IN NACL 5-0.79 MG/ML-% IV SOLN
500.0000 mg | Freq: Once | INTRAVENOUS | Status: AC
  Administered 2020-08-05: 500 mg via INTRAVENOUS
  Filled 2020-08-05: qty 100

## 2020-08-05 MED ORDER — SODIUM CHLORIDE 0.9 % IV SOLN: INTRAVENOUS | Status: AC

## 2020-08-05 NOTE — ED Provider Notes (Signed)
[facility] EMERGENCY DEPARTMENT Provider Note   CSN: [id number] Arrival date & time: 08/05/20  0136   History Chief Complaint  Patient presents with  . Emesis    David Vargas is a 69 y.o. male.  The history is provided by the patient and the spouse.  Emesis He has history of hypertension, hyperlipidemia, paroxysmal atrial fibrillation but not on anticoagulation and comes in by ambulance because of fever and chills. He had a prostate biopsy on 8/9, and did have some hematuria following that. On 8/10, he developed a low grade fever to 100.5, and his urologist gave him a prescription for trimehoprim-sulfamethoxazole. He has taken one dose of the antibiotic. Tonight, he had a shaking chill with associated nausea and vomiting. There has been a mild cough, which has been nonproductive. He denies any dysuria. He denies myalgias. Temperature went up to 102, and he came to the emergency department. He denies sick contacts. He is fully vaccinated against COVID-19.  Past Medical History:  Diagnosis Date  . Arrhythmia    paroxysmal afib  . Arthritis   . Basal cell carcinoma    followed by dermatology  . Cancer ([place])    melanoma - left shoulder  . Complication of anesthesia    slow to wake up  . GERD (gastroesophageal reflux disease)   . H/O colonoscopy    2009, polypoid colorectal mucosa found, Dr. Oletta Lamas advised repeat in 3 years  . Hiatal hernia    upper GI in 2003  . History of kidney stones   . History of nuclear stress test 2005   abnormal, cardiac cath done   . Hypertension     Patient Active Problem List   Diagnosis Date Noted  . Primary osteoarthritis of left knee 11/13/2017  . Hyperlipidemia 11/06/2017  . Preoperative clearance 11/06/2017  . PAF (paroxysmal atrial fibrillation) ([place]) 01/28/2014  . Essential hypertension, benign 01/28/2014    Past Surgical History:  Procedure Laterality Date  . CARDIAC CATHETERIZATION  2005   no evidence of  significamt atherosclerosis  . EXTRACORPOREAL SHOCK WAVE LITHOTRIPSY Right 02/17/2020   Procedure: EXTRACORPOREAL SHOCK WAVE LITHOTRIPSY (ESWL);  Surgeon: Irine Seal, MD;  Location[facility];  Service: Urology;  Laterality: Right;  . HERNIA REPAIR     right groin  . KNEE SURGERY     left 1984, right 2010  Arthroscopic  . SHOULDER SURGERY Right    rotator cuff repair  . TOTAL KNEE ARTHROPLASTY Left 11/28/2017   Procedure: TOTAL KNEE ARTHROPLASTY;  Surgeon: Renette Butters, MD;  Location: [facility];  Service: Orthopedics;  Laterality: Left;       Family History  Problem Relation Age of Onset  . Heart attack Mother   . Liver cancer Father   . Multiple sclerosis Sister     Social History   Tobacco Use  . Smoking status: Never Smoker  . Smokeless tobacco: Never Used  Vaping Use  . Vaping Use: Never used  Substance Use Topics  . Alcohol use: Yes    Comment: few times a week  . Drug use: No    Home Medications Prior to Admission medications   Medication Sig Start Date End Date Taking? Authorizing Provider  atorvastatin (LIPITOR) 20 MG tablet Take 1 tablet (20 mg total) by mouth daily. 08/03/20   Sueanne Margarita, MD  fluticasone (FLONASE) 50 MCG/ACT nasal spray Place 2 sprays daily as needed into the nose for allergies.     [provider]  HYDROcodone-acetaminophen (NORCO/VICODIN) 5-325 MG tablet Take 1 tablet by mouth every 6 (six) hours as needed for moderate pain.    [provider]  Melatonin 5 MG TABS Take 5 mg by mouth at bedtime.    [provider]  oxymetazoline (AFRIN) 0.05 % nasal spray Place 1 spray daily as needed into both nostrils for congestion.    [provider]  tadalafil (CIALIS) 10 MG tablet Take 5 mg by mouth daily as needed for erectile dysfunction.     [provider]  tamsulosin (FLOMAX) 0.4 MG CAPS capsule Take 0.4 mg daily as needed by mouth (as needed for kidney stone flare up).     [provider]    Allergies    Patient has no known allergies.  Review of Systems   Review of Systems  Gastrointestinal: Positive for vomiting.  All other systems reviewed and are negative.   Physical Exam Updated Vital Signs BP (!) 107/59 (BP Location: Right Arm)   Pulse 98   Temp (!) 100.5 F (38.1 C) (Oral)   Resp 18   Ht 6\' 4"  (1.93 m)   Wt 98.9 kg   SpO2 94%   BMI 26.54 kg/m   Physical Exam Vitals and nursing note reviewed.   69 year old male, resting comfortably and in no acute distress. Vital signs are significant for elevated temperature. Oxygen saturation is 94%, which is normal. He is nontoxic in appearance. Head is normocephalic and atraumatic. PERRLA, EOMI. Oropharynx is clear. Neck is nontender and supple without adenopathy or JVD. Back is nontender and there is no CVA tenderness. Lungs are clear without rales, wheezes, or rhonchi. Chest is nontender. Heart has regular rate and rhythm without murmur. Abdomen is soft, flat, nontender without masses or hepatosplenomegaly and peristalsis is normoactive. Extremities have no cyanosis or edema, full range of motion is present. Skin is warm and dry without rash. Neurologic: Mental status is normal, cranial nerves are intact, there are no motor or sensory deficits.  ED Results / Procedures / Treatments   Labs (all labs ordered are listed, but only abnormal results are displayed) Labs Reviewed  LACTIC ACID, PLASMA - Abnormal; Notable for the following components:      Result Value   Lactic Acid, Venous 2.0 (*)    All other components within normal limits  COMPREHENSIVE METABOLIC PANEL - Abnormal; Notable for the following components:   Glucose, Bld 116 (*)    Creatinine, Ser 1.26 (*)    Total Protein 6.3 (*)    GFR calc non Af Amer 57 (*)    All other components within normal limits  CBC WITH DIFFERENTIAL/PLATELET - Abnormal; Notable for the following components:   Platelets 131 (*)    Neutro Abs 9.0 (*)     Lymphs Abs 0.3 (*)    All other components within normal limits  URINALYSIS, ROUTINE W REFLEX MICROSCOPIC - Abnormal; Notable for the following components:   APPearance CLOUDY (*)    Hgb urine dipstick LARGE (*)    Protein, ur 100 (*)    Nitrite POSITIVE (*)    Leukocytes,Ua TRACE (*)    All other components within normal limits  URINALYSIS, MICROSCOPIC (REFLEX) - Abnormal; Notable for the following components:   Bacteria, UA RARE (*)    All other components within normal limits  CULTURE, BLOOD (SINGLE)  URINE CULTURE  SARS CORONAVIRUS 2 BY RT PCR (HOSPITAL ORDER, [place])  PROTIME-INR  APTT  LACTIC ACID, PLASMA  EKG EKG Interpretation  Date/Time:  Wednesday August 05 2020 01:22:16 EDT Ventricular Rate:  99 PR Interval:  182 QRS Duration: 102 QT Interval:  340 QTC Calculation: 436 R Axis:   -30 Text Interpretation: Normal sinus rhythm Left axis deviation Incomplete right bundle branch block Abnormal ECG No old tracing to compare Confirmed by Delora Fuel ([id number]) on 08/05/2020 1:47:00 AM   Radiology DG Chest Port 1 View  Result Date: 08/05/2020 CLINICAL DATA:  Vomiting and tremors EXAM: PORTABLE CHEST 1 VIEW COMPARISON:  None. FINDINGS: The heart size and mediastinal contours are within normal limits. Both lungs are clear. The visualized skeletal structures are unremarkable. IMPRESSION: No active disease. Electronically Signed   By: Prudencio Pair M.D.   On: 08/05/2020 02:25    Procedures Procedures  CRITICAL CARE Performed by: Delora Fuel Total critical care time: 35 minutes Critical care time was exclusive of separately billable procedures and treating other patients. Critical care was necessary to treat or prevent imminent or life-threatening deterioration. Critical care was time spent personally by me on the following activities: development of treatment plan with patient and/or surrogate as well as nursing, discussions with  consultants, evaluation of patient's response to treatment, examination of patient, obtaining history from patient or surrogate, ordering and performing treatments and interventions, ordering and review of laboratory studies, ordering and review of radiographic studies, pulse oximetry and re-evaluation of patient's condition.  Medications Ordered in ED Medications  lactated ringers bolus 1,000 mL (1,000 mLs Intravenous New Bag/Given 08/05/20 0437)    And  lactated ringers bolus 1,000 mL (has no administration in time range)    And  lactated ringers bolus 1,000 mL (has no administration in time range)  acetaminophen (TYLENOL) tablet 650 mg (650 mg Oral Given 08/05/20 0224)  cefTRIAXone (ROCEPHIN) 2 g in sodium chloride 0.9 % 100 mL IVPB (0 g Intravenous Stopped 08/05/20 0420)  metroNIDAZOLE (FLAGYL) IVPB 500 mg (0 mg Intravenous Stopped 08/05/20 0420)  ondansetron (ZOFRAN) injection 4 mg (4 mg Intravenous Given 08/05/20 0225)    ED Course  I have reviewed the triage vital signs and the nursing notes.  Pertinent labs & imaging results that were available during my care of the patient were reviewed by me and considered in my medical decision making (see chart for details).  MDM Rules/Calculators/A&P Fever and chills 1 day following prostate biopsy.  Patient is nontoxic in appearance although vital signs to meet criteria for sepsis.  Sepsis work-up is initiated and he is started on antibiotics.  Old records are reviewed, and it is noted that he did have lithotripsy done last February.  Doubt fevers related to urolithiasis as he has not been having any abdominal or flank pain.  Chest x-ray is unremarkable.  Lactate is borderline elevated at 2.0 and is given additional IV fluids.  Creatinine is mildly elevated at 1.26, last checked on 7/14 and was 1.0.  WBC is normal, but with left shift.  Case is discussed with Dr. Clearence Ped of [facility] hospitalist, agrees to admit the patient.  Final Clinical  Impression(s) / ED Diagnoses Final diagnoses:  Sepsis due to undetermined organism [facility])  Renal insufficiency    Rx / DC Orders ED Discharge Orders    None       Delora Fuel, MD 71/24/58 [phone]

## 2020-08-05 NOTE — Progress Notes (Signed)
Pharmacy Antibiotic Note  David Vargas is a 69 y.o. male with recent history of prostate biopsy (8/9) admitted on 08/05/2020 with fever and chills, concern for intraabdominal infection. Given recent prostate biopsy, pharmacy has been consulted for meropenem dosing.  Plan: Meropenem 1g IV q8h Monitor clinical picture, renal function F/U C&S, abx de-escalation, LOT   Height: 6\' 4"  (193 cm) Weight: 98.9 kg (218 lb) IBW/kg (Calculated) : 86.8  Temp (24hrs), Avg:99.4 F (37.4 C), Min:98.3 F (36.8 C), Max:100.5 F (38.1 C)  Recent Labs  Lab 08/05/20 0218 08/05/20 0404  WBC 9.5  --   CREATININE 1.26*  --   LATICACIDVEN 2.0* 1.7    Estimated Creatinine Clearance: 67 mL/min (A) (by C-G formula based on SCr of 1.26 mg/dL (H)).    No Known Allergies  Antimicrobials this admission: CTX 8/11 x1 Flagyl 8/11 x1 Meropenem 8/11>>  Microbiology results: 8/11 BCx: sent 8/11 UCx: sent   Brendolyn Patty, PharmD Clinical Pharmacist  08/05/2020   12:28 PM   Please check AMION for all [facility] phone numbers After 10:00 PM, call the [facility] [phone]

## 2020-08-05 NOTE — Progress Notes (Signed)
Subjective: CC: Fever.  Hx: David Vargas had a routine prostate biopsy on 08/03/20 in the office with levaquin for the prep.  He did well until yesterday morning when he experienced a fever and chills.  I spoke to him yesterday afternoon and he was feeling better but I sent bactrim but after the first dose he had more severe fever and chills with some urgency and frequency and was taken to [facility] by EMS after he had some confusion.  He has e. Coli on the blood culture ID panel and is now on meropenem with an improving fever curve and lactate level. He has AKI with a Cr of 1.26.  He is voiding but still has some frequency.  ROS:  Review of Systems  All other systems reviewed and are negative.   Anti-infectives: Anti-infectives (From admission, onward)   Start     Dose/Rate Route Frequency Ordered Stop   08/06/20 0400  cefTRIAXone (ROCEPHIN) 1 g in sodium chloride 0.9 % 100 mL IVPB  Status:  Discontinued        1 g 200 mL/hr over 30 Minutes Intravenous Every 24 hours 08/05/20 0633 08/05/20 1200   08/05/20 1230  meropenem (MERREM) 1 g in sodium chloride 0.9 % 100 mL IVPB     Discontinue     1 g 200 mL/hr over 30 Minutes Intravenous Every 8 hours 08/05/20 1222     08/05/20 0215  cefTRIAXone (ROCEPHIN) 2 g in sodium chloride 0.9 % 100 mL IVPB        2 g 200 mL/hr over 30 Minutes Intravenous  Once 08/05/20 0204 08/05/20 0420   08/05/20 0215  metroNIDAZOLE (FLAGYL) IVPB 500 mg        500 mg 100 mL/hr over 60 Minutes Intravenous  Once 08/05/20 0204 08/05/20 0420      Current Facility-Administered Medications  Medication Dose Route Frequency Provider Last Rate Last Admin  . acetaminophen (TYLENOL) tablet 650 mg  650 mg Oral Q6H PRN Zierle-Ghosh, Asia B, DO   650 mg at 08/05/20 1305   Or  . acetaminophen (TYLENOL) suppository 650 mg  650 mg Rectal Q6H PRN Zierle-Ghosh, Asia B, DO      . atorvastatin (LIPITOR) tablet 20 mg  20 mg Oral QHS Zierle-Ghosh, Asia B, DO      . melatonin tablet 5 mg  5 mg  Oral QHS PRN Zierle-Ghosh, Asia B, DO      . meropenem (MERREM) 1 g in sodium chloride 0.9 % 100 mL IVPB  1 g Intravenous Q8H Alekh, Kshitiz, MD 200 mL/hr at 08/05/20 1318 1 g at 08/05/20 1318  . morphine 2 MG/ML injection 2 mg  2 mg Intravenous Q2H PRN Zierle-Ghosh, Asia B, DO      . ondansetron (ZOFRAN) tablet 4 mg  4 mg Oral Q6H PRN Zierle-Ghosh, Asia B, DO       Or  . ondansetron (ZOFRAN) injection 4 mg  4 mg Intravenous Q6H PRN Zierle-Ghosh, Asia B, DO      . oxyCODONE (Oxy IR/ROXICODONE) immediate release tablet 5 mg  5 mg Oral Q4H PRN Zierle-Ghosh, Asia B, DO      . polyethylene glycol (MIRALAX / GLYCOLAX) packet 17 g  17 g Oral Daily PRN Zierle-Ghosh, Asia B, DO         Objective: Vital signs in last 24 hours: Temp:  [98.3 F (36.8 C)-100.5 F (38.1 C)] 98.9 F (37.2 C) (08/11 1356) Pulse Rate:  [67-98] 98 (08/11 1356) Resp:  [16-18] 16 (08/11 1356)  BP: (106-120)/(59-77) 120/69 (08/11 1356) SpO2:  [94 %-97 %] 97 % (08/11 1356) Weight:  [98.9 kg] 98.9 kg (08/11 0141)  Intake/Output from previous day: No intake/output data recorded. Intake/Output this shift: Total I/O In: 440 [P.O.:440] Out: -    Physical Exam Vitals reviewed.  Constitutional:      Appearance: Normal appearance.  Abdominal:     Palpations: Abdomen is soft. There is no mass.     Tenderness: There is no abdominal tenderness.  Neurological:     Mental Status: He is alert.     Lab Results:  Recent Labs    08/05/20 0218  WBC 9.5  HGB 14.9  HCT 46.4  PLT 131*   BMET Recent Labs    08/05/20 0218  NA 138  K 4.1  CL 103  CO2 25  GLUCOSE 116*  BUN 20  CREATININE 1.26*  CALCIUM 9.0   PT/INR Recent Labs    08/05/20 0218  LABPROT 14.9  INR 1.2   ABG No results for input(s): PHART, HCO3 in the last 72 hours.  Invalid input(s): PCO2, PO2  Studies/Results: DG Chest Port 1 View  Result Date: 08/05/2020 CLINICAL DATA:  Vomiting and tremors EXAM: PORTABLE CHEST 1 VIEW COMPARISON:   None. FINDINGS: The heart size and mediastinal contours are within normal limits. Both lungs are clear. The visualized skeletal structures are unremarkable. IMPRESSION: No active disease. Electronically Signed   By: Prudencio Pair M.D.   On: 08/05/2020 02:25   Note and labs reviewed.   Assessment and Plan: Post prostate biopsy sepsis.  Blood culture ID panel demonstrates e. Coli with sensitivities pending.  Continue current therapy.        LOS: 0 days    Irine Seal 08/05/2020 [phone] ID: [name], male   DOB: 1951-08-25, 69 y.o.   MRN: [id number]

## 2020-08-05 NOTE — Progress Notes (Signed)
Patient ID: David Vargas, male   DOB: 06-21-1951, 69 y.o.   MRN: [id number] Patient was admitted early this morning for fever and chills and has been started on IV antibiotics and fluids.  Patient seen and examined at bedside and plan of care discussed with him.  I have reviewed patient's medical records including this morning's H&P, current vitals, labs, medications myself.  I will notify Dr. Wrenn/urology regarding patient's admission as per patient's request.  Follow cultures.  Repeat a.m. labs.

## 2020-08-05 NOTE — H&P (Signed)
TRH H&P    Patient Demographics:    David Vargas, is a 69 y.o. male  MRN: [id number]  DOB - [birth year]  Admit Date - 08/05/2020  Referring MD/NP/PA: Dr. Roxanne Mins  Outpatient Primary MD for the patient is Hulan Fess, MD  Patient coming from: Home  Chief complaint- Fever and chills    HPI:    David Vargas  is a 68 y.o. male, pretension, nephrolithiasis, GERD, history of cancer, who presents the ED today for fever.  Patient reports that he had a prostate biopsy on 9 August.  10 August he started feeling feverish.  He called his urologist who wrote him a prescription for Bactrim, but told him that if the fever continued he have to come into the office.  After taking 1 dose of Bactrim patient became nauseous, headache, with multiple episodes of emesis.  Patient reports temperature of 100.5 maximum.  He reports shaking chills.  Patient reports blood in urine and blood in stool since his biopsy.  He denies dysuria, frequency, urgency.  Patient admits to cough only associated with nausea.  He has had no shortness of breath, no productive cough.  Patient reports that he has a history of nephrolithiasis and this feels similar to that with the nausea.  Does not feel the same as the other is usually accompanied by flank pain.  Patient does report he did lithotripsy in February.  Patient would like his urologist to be notified that he is admitted in the hospital.  In the ER Temperature 100.5, blood pressure 107/59, respiratory 18, heart rate 102, white blood cell count 9.5 Lactic acidosis 2.0  Sepsis protocol in ED Patient received 3 L bolus, Rocephin, Flagyl UA shows positive for leukocytes nitrates, only 0-5 white blood cells urine culture pending Blood culture pending Covid negative Chest x-ray does not show pneumonia Admission requested for further work-up      Review of systems:  Review of Systems   Constitutional: Positive for chills, diaphoresis and fever.  HENT: Negative for congestion, hearing loss, sinus pain, sore throat and tinnitus.   Eyes: Negative for pain and discharge.  Respiratory: Positive for cough (associated with nausea). Negative for sputum production and shortness of breath.   Cardiovascular: Negative for chest pain, palpitations and leg swelling.  Gastrointestinal: Positive for blood in stool (since prostate biopsy) and nausea. Negative for abdominal pain, constipation, diarrhea and vomiting.  Genitourinary: Positive for hematuria (since prostate biopsy). Negative for dysuria, frequency and urgency.  Musculoskeletal: Negative for myalgias.  Skin: Negative for itching and rash.  Neurological: Positive for headaches. Negative for seizures and loss of consciousness.  Psychiatric/Behavioral: Negative for substance abuse.    All other systems reviewed and are negative.    Past History of the following :    Past Medical History:  Diagnosis Date   Arrhythmia    paroxysmal afib   Arthritis    Basal cell carcinoma    followed by dermatology   Cancer [facility])    melanoma - left shoulder   Complication of anesthesia    slow  to wake up   GERD (gastroesophageal reflux disease)    H/O colonoscopy    2009, polypoid colorectal mucosa found, Dr. Oletta Lamas advised repeat in 3 years   Hiatal hernia    upper GI in 2003   History of kidney stones    History of nuclear stress test 2005   abnormal, cardiac cath done    Hypertension       Past Surgical History:  Procedure Laterality Date   CARDIAC CATHETERIZATION  2005   no evidence of significamt atherosclerosis   EXTRACORPOREAL SHOCK WAVE LITHOTRIPSY Right 02/17/2020   Procedure: EXTRACORPOREAL SHOCK WAVE LITHOTRIPSY (ESWL);  Surgeon: Irine Seal, MD;  Location: [facility];  Service: Urology;  Laterality: Right;   HERNIA REPAIR     right groin   KNEE SURGERY     left 1984, right 2010   Arthroscopic   SHOULDER SURGERY Right    rotator cuff repair   TOTAL KNEE ARTHROPLASTY Left 11/28/2017   Procedure: TOTAL KNEE ARTHROPLASTY;  Surgeon: Renette Butters, MD;  Location: [facility];  Service: Orthopedics;  Laterality: Left;      Social History:      Social History   Tobacco Use   Smoking status: Never Smoker   Smokeless tobacco: Never Used  Substance Use Topics   Alcohol use: Yes    Comment: few times a week       Family History :     Family History  Problem Relation Age of Onset   Heart attack Mother    Liver cancer Father    Multiple sclerosis Sister    Reviewed   Home Medications:   Prior to Admission medications   Medication Sig Start Date End Date Taking? Authorizing Provider  acetaminophen (TYLENOL) 325 MG tablet Take 650 mg by mouth every 6 (six) hours as needed for mild pain or fever.   Yes [provider]  atorvastatin (LIPITOR) 20 MG tablet Take 1 tablet (20 mg total) by mouth daily. Patient taking differently: Take 20 mg by mouth at bedtime.  08/03/20  Yes Turner, Eber Hong, MD  levofloxacin (LEVAQUIN) 750 MG tablet Take 750 mg by mouth once. 06/10/20  Yes [provider]  Melatonin 5 MG TABS Take 5 mg by mouth at bedtime as needed (for sleep).    Yes [provider]  sulfamethoxazole-trimethoprim (BACTRIM DS) 800-160 MG tablet Take 1 tablet by mouth 2 (two) times daily. 08/04/20  Yes [provider]  tadalafil (CIALIS) 10 MG tablet Take 5 mg by mouth daily as needed for erectile dysfunction.    Yes [provider]  tamsulosin (FLOMAX) 0.4 MG CAPS capsule Take 0.4 mg daily as needed by mouth (as needed for kidney stone flare up).    Yes [provider]     Allergies:    No Known Allergies   Physical Exam:   Vitals  Blood pressure (!) 107/59, pulse 98, temperature (!) 100.5 F (38.1 C), temperature source Oral, resp. rate 18, height 6\' 4"  (1.93 m), weight 98.9 kg, SpO2 94 %.  1.   General: Lying supine in bed in no acute distress  2. Psychiatric: Pleasant, mood and behavior normal for situation  3. Neurologic: Cranial nerves II through XII are grossly intact No focal deficit on limited exam Moves all 4 extremities voluntarily  4. HEENMT:  Head is atraumatic, normocephalic Trachea is midline Neck is supple Mucous membranes are moist  5. Respiratory : Lungs are clear to auscultation bilaterally  6. Cardiovascular :  Heart rate is normal, rhythm is regular No murmurs rubs or gallops  7. Gastrointestinal:  Abdomen is soft, nondistended, nontender to palpation  8. Skin:  Diaphoretic, no acute lesions on limited skin exam  9.Musculoskeletal:  No peripheral edema    Data Review:    CBC Recent Labs  Lab 08/05/20 0218  WBC 9.5  HGB 14.9  HCT 46.4  PLT 131*  MCV 91.7  MCH 29.4  MCHC 32.1  RDW 13.6  LYMPHSABS 0.3*  MONOABS 0.2  EOSABS 0.0  BASOSABS 0.0   ------------------------------------------------------------------------------------------------------------------  Results for orders placed or performed during the hospital encounter of 08/05/20 (from the past 48 hour(s))  Lactic acid, plasma     Status: Abnormal   Collection Time: 08/05/20  2:18 AM  Result Value Ref Range   Lactic Acid, Venous 2.0 (HH) 0.5 - 1.9 mmol/L    Comment: CRITICAL RESULT CALLED TO, READ BACK BY AND VERIFIED WITH: Y.PATTERSON,RN [id number] 08/05/2020 M.CAMPBELL Performed at [facility], [place] [address] [place], [place] [address]   Comprehensive metabolic panel     Status: Abnormal   Collection Time: 08/05/20  2:18 AM  Result Value Ref Range   Sodium 138 135 - 145 mmol/L   Potassium 4.1 3.5 - 5.1 mmol/L   Chloride 103 98 - 111 mmol/L   CO2 25 22 - 32 mmol/L   Glucose, Bld 116 (H) 70 - 99 mg/dL    Comment: Glucose reference range applies only to samples taken after fasting for at least 8 hours.   BUN 20 8 - 23 mg/dL   Creatinine, Ser 1.26 (H) 0.61 - 1.24  mg/dL   Calcium 9.0 8.9 - 10.3 mg/dL   Total Protein 6.3 (L) 6.5 - 8.1 g/dL   Albumin 3.5 3.5 - 5.0 g/dL   AST 29 15 - 41 U/L   ALT 26 0 - 44 U/L   Alkaline Phosphatase 67 38 - 126 U/L   Total Bilirubin 1.1 0.3 - 1.2 mg/dL   GFR calc non Af Amer 57 (L) >60 mL/min   GFR calc Af Amer >60 >60 mL/min   Anion gap 10 5 - 15    Comment: Performed at [facility] [address], [place], [place] [address]  CBC WITH DIFFERENTIAL     Status: Abnormal   Collection Time: 08/05/20  2:18 AM  Result Value Ref Range   WBC 9.5 4.0 - 10.5 K/uL   RBC 5.06 4.22 - 5.81 MIL/uL   Hemoglobin 14.9 13.0 - 17.0 g/dL   HCT 46.4 39 - 52 %   MCV 91.7 80.0 - 100.0 fL   MCH 29.4 26.0 - 34.0 pg   MCHC 32.1 30.0 - 36.0 g/dL   RDW 13.6 11.5 - 15.5 %   Platelets 131 (L) 150 - 400 K/uL   nRBC 0.0 0.0 - 0.2 %   Neutrophils Relative % 94 %   Neutro Abs 9.0 (H) 1.7 - 7.7 K/uL   Lymphocytes Relative 3 %   Lymphs Abs 0.3 (L) 0.7 - 4.0 K/uL   Monocytes Relative 2 %   Monocytes Absolute 0.2 0 - 1 K/uL   Eosinophils Relative 0 %   Eosinophils Absolute 0.0 0 - 0 K/uL   Basophils Relative 0 %   Basophils Absolute 0.0 0 - 0 K/uL   Immature Granulocytes 1 %   Abs Immature Granulocytes 0.05 0.00 - 0.07 K/uL    Comment: Performed at [facility], [address] [place],  [place] [address]  Protime-INR     Status: None   Collection Time: 08/05/20  2:18 AM  Result Value Ref Range   Prothrombin Time 14.9 11.4 - 15.2 seconds   INR 1.2 0.8 - 1.2    Comment: (NOTE) INR goal varies based on device and disease states. Performed at [facility], [place] [address] [place], [place] [address]   APTT     Status: None   Collection Time: 08/05/20  2:18 AM  Result Value Ref Range   aPTT 29 24 - 36 seconds    Comment: Performed at [facility] [address] [place], [place] [address]  Urinalysis, Routine w reflex microscopic     Status: Abnormal   Collection Time: 08/05/20  2:18 AM  Result Value Ref  Range   Color, Urine YELLOW YELLOW   APPearance CLOUDY (A) CLEAR   Specific Gravity, Urine 1.020 1.005 - 1.030   pH 6.0 5.0 - 8.0   Glucose, UA NEGATIVE NEGATIVE mg/dL   Hgb urine dipstick LARGE (A) NEGATIVE   Bilirubin Urine NEGATIVE NEGATIVE   Ketones, ur NEGATIVE NEGATIVE mg/dL   Protein, ur 100 (A) NEGATIVE mg/dL   Nitrite POSITIVE (A) NEGATIVE   Leukocytes,Ua TRACE (A) NEGATIVE    Comment: Performed at [facility] [address], [place], [place] [address]  Urinalysis, Microscopic (reflex)     Status: Abnormal   Collection Time: 08/05/20  2:18 AM  Result Value Ref Range   RBC / HPF >50 0 - 5 RBC/hpf   WBC, UA 0-5 0 - 5 WBC/hpf   Bacteria, UA RARE (A) NONE SEEN   Squamous Epithelial / LPF 0-5 0 - 5   Urine-Other LESS THAN 10 mL OF URINE SUBMITTED     Comment: Performed at [facility], [place] [address], [place], [place] [address]  Lactic acid, plasma     Status: None   Collection Time: 08/05/20  4:04 AM  Result Value Ref Range   Lactic Acid, Venous 1.7 0.5 - 1.9 mmol/L    Comment: Performed at [facility] [address], [place], [place] [address]  SARS Coronavirus 2 by RT PCR (hospital order, performed in [facility] hospital lab) Nasopharyngeal Nasopharyngeal Swab     Status: None   Collection Time: 08/05/20  4:34 AM   Specimen: Nasopharyngeal Swab  Result Value Ref Range   SARS Coronavirus 2 NEGATIVE NEGATIVE    Comment: (NOTE) SARS-CoV-2 target nucleic acids are NOT DETECTED.  The SARS-CoV-2 RNA is generally detectable in upper and lower respiratory specimens during the acute phase of infection. The lowest concentration of SARS-CoV-2 viral copies this assay can detect is 250 copies / mL. A negative result does not preclude SARS-CoV-2 infection and should not be used as the sole basis for treatment or other patient management decisions.  A negative result may occur with improper specimen collection / handling, submission of specimen other than  nasopharyngeal swab, presence of viral mutation(s) within the areas targeted by this assay, and inadequate number of viral copies (<250 copies / mL). A negative result must be combined with clinical observations, patient history, and epidemiological information.  Fact Sheet for Patients:   StrictlyIdeas.no  Fact Sheet for Healthcare Providers: BankingDealers.co.za  This test is not yet approved or  cleared by the [place] FDA and has been authorized for detection and/or diagnosis of SARS-CoV-2 by FDA under an Emergency Use Authorization (EUA).  This EUA will remain in effect (  meaning this test can be used) for the duration of the COVID-19 declaration under Section 564(b)(1) of the Act, 21 U.S.C. section 360bbb-3(b)(1), unless the authorization is terminated or revoked sooner.  Performed at [facility], [place] [address], [place], [place] [address]     Chemistries  Recent Labs  Lab 08/05/20 0218  NA 138  K 4.1  CL 103  CO2 25  GLUCOSE 116*  BUN 20  CREATININE 1.26*  CALCIUM 9.0  AST 29  ALT 26  ALKPHOS 67  BILITOT 1.1   ------------------------------------------------------------------------------------------------------------------  ------------------------------------------------------------------------------------------------------------------ GFR: Estimated Creatinine Clearance: 67 mL/min (A) (by C-G formula based on SCr of 1.26 mg/dL (H)). Liver Function Tests: Recent Labs  Lab 08/05/20 0218  AST 29  ALT 26  ALKPHOS 67  BILITOT 1.1  PROT 6.3*  ALBUMIN 3.5   No results for input(s): LIPASE, AMYLASE in the last 168 hours. No results for input(s): AMMONIA in the last 168 hours. Coagulation Profile: Recent Labs  Lab 08/05/20 0218  INR 1.2   Cardiac Enzymes: No results for input(s): CKTOTAL, CKMB, CKMBINDEX, TROPONINI in the last 168 hours. BNP (last 3 results) No results for input(s): PROBNP in  the last 8760 hours. HbA1C: No results for input(s): HGBA1C in the last 72 hours. CBG: No results for input(s): GLUCAP in the last 168 hours. Lipid Profile: No results for input(s): CHOL, HDL, LDLCALC, TRIG, CHOLHDL, LDLDIRECT in the last 72 hours. Thyroid Function Tests: No results for input(s): TSH, T4TOTAL, FREET4, T3FREE, THYROIDAB in the last 72 hours. Anemia Panel: No results for input(s): VITAMINB12, FOLATE, FERRITIN, TIBC, IRON, RETICCTPCT in the last 72 hours.  --------------------------------------------------------------------------------------------------------------- Urine analysis:    Component Value Date/Time   COLORURINE YELLOW 08/05/2020 0218   APPEARANCEUR CLOUDY (A) 08/05/2020 0218   LABSPEC 1.020 08/05/2020 0218   PHURINE 6.0 08/05/2020 0218   GLUCOSEU NEGATIVE 08/05/2020 0218   HGBUR LARGE (A) 08/05/2020 0218   BILIRUBINUR NEGATIVE 08/05/2020 0218   KETONESUR NEGATIVE 08/05/2020 0218   PROTEINUR 100 (A) 08/05/2020 0218   NITRITE POSITIVE (A) 08/05/2020 0218   LEUKOCYTESUR TRACE (A) 08/05/2020 0218      Imaging Results:    DG Chest Port 1 View  Result Date: 08/05/2020 CLINICAL DATA:  Vomiting and tremors EXAM: PORTABLE CHEST 1 VIEW COMPARISON:  None. FINDINGS: The heart size and mediastinal contours are within normal limits. Both lungs are clear. The visualized skeletal structures are unremarkable. IMPRESSION: No active disease. Electronically Signed   By: Prudencio Pair M.D.   On: 08/05/2020 02:25       Assessment & Plan:    Active Problems:   Fever and chills   1. Fever in adult 1. In the setting of recent prostate biopsy on 9 August 2. Sepsis criteria resolved 3. UA shows positive leuks positive nitrates, but only 0-5 white blood cells 4. Urine culture pending 5. Blood culture pending 6. Chest x-ray shows no active disease 7. Covid is negative 8. No rashes or acute lesions on skin 9. Rocephin and Flagyl started in the ED 10. Continue  Rocephin to cover prostate pathogens 11. Continue to monitor 2. Lactic acidosis 1. Initial lactic acid of 2.0 2. After 3 L fluid bolus improved to 1.7 3. Continue to monitor 3. Headache 1. Patient reports headache feels like his headache that he would normally get from being sleep deprived 2. Tylenol and ibuprofen did not help 3. Adding Reglan and Benadryl to make a headache cocktail 4. No neuro deficits associated with headache 4.  Hematuria 1. To be expected status post prostate biopsy 2. History of nephrolithiasis but reports this does not feel like that 3. May consider further imaging for nephrolithiasis if work-up is not providing clear etiology for fever   DVT Prophylaxis-   SCDs   AM Labs Ordered, also please review Full Orders  Family Communication:No family at bedside Code Status:  Full  Admission status: Observation  Time spent in minutes : [place] DO

## 2020-08-05 NOTE — Progress Notes (Signed)
PHARMACY - PHYSICIAN COMMUNICATION CRITICAL VALUE ALERT - BLOOD CULTURE IDENTIFICATION (BCID)  David Vargas is an 69 y.o. male who presented to [facility] on 08/05/2020 with a chief complaint of fever/chills   Assessment:  2/2 BCx positive GNRs. BCID revealed E.coli with no ESBL.   (include suspected source if known)  Name of physician (or Provider) Contacted: Dr. Starla Link  Current antibiotics: Meropenem   Changes to prescribed antibiotics recommended: Narrow to ceftriaxone  Recommendations declined by provider due to urology broadened from ceftriaxone to meropenem. Hospitalist will touch base with urology tomorrow about possibility of narrowing antibiotics.   Results for orders placed or performed during the hospital encounter of 08/05/20  Blood Culture ID Panel (Reflexed) (Collected: 08/05/2020  2:39 AM)  Result Value Ref Range   Enterococcus faecalis NOT DETECTED NOT DETECTED   Enterococcus Faecium NOT DETECTED NOT DETECTED   Listeria monocytogenes NOT DETECTED NOT DETECTED   Staphylococcus species NOT DETECTED NOT DETECTED   Staphylococcus aureus (BCID) NOT DETECTED NOT DETECTED   Staphylococcus epidermidis NOT DETECTED NOT DETECTED   Staphylococcus lugdunensis NOT DETECTED NOT DETECTED   Streptococcus species NOT DETECTED NOT DETECTED   Streptococcus agalactiae NOT DETECTED NOT DETECTED   Streptococcus pneumoniae NOT DETECTED NOT DETECTED   Streptococcus pyogenes NOT DETECTED NOT DETECTED   A.calcoaceticus-baumannii NOT DETECTED NOT DETECTED   Bacteroides fragilis NOT DETECTED NOT DETECTED   Enterobacterales DETECTED (A) NOT DETECTED   Enterobacter cloacae complex NOT DETECTED NOT DETECTED   Escherichia coli DETECTED (A) NOT DETECTED   Klebsiella aerogenes NOT DETECTED NOT DETECTED   Klebsiella oxytoca NOT DETECTED NOT DETECTED   Klebsiella pneumoniae NOT DETECTED NOT DETECTED   Proteus species NOT DETECTED NOT DETECTED   Salmonella species NOT DETECTED NOT DETECTED    Serratia marcescens NOT DETECTED NOT DETECTED   Haemophilus influenzae NOT DETECTED NOT DETECTED   Neisseria meningitidis NOT DETECTED NOT DETECTED   Pseudomonas aeruginosa NOT DETECTED NOT DETECTED   Stenotrophomonas maltophilia NOT DETECTED NOT DETECTED   Candida albicans NOT DETECTED NOT DETECTED   Candida auris NOT DETECTED NOT DETECTED   Candida glabrata NOT DETECTED NOT DETECTED   Candida krusei NOT DETECTED NOT DETECTED   Candida parapsilosis NOT DETECTED NOT DETECTED   Candida tropicalis NOT DETECTED NOT DETECTED   Cryptococcus neoformans/gattii NOT DETECTED NOT DETECTED   CTX-M ESBL NOT DETECTED NOT DETECTED   Carbapenem resistance IMP NOT DETECTED NOT DETECTED   Carbapenem resistance KPC NOT DETECTED NOT DETECTED   Carbapenem resistance NDM NOT DETECTED NOT DETECTED   Carbapenem resist OXA 48 LIKE NOT DETECTED NOT DETECTED   Carbapenem resistance VIM NOT DETECTED NOT DETECTED    Albertina Parr, PharmD., BCPS, BCCCP Clinical Pharmacist

## 2020-08-05 NOTE — ED Triage Notes (Signed)
Pt arrived via ems due to vomiting and tremors. Pt had x2 episodes of vomiting prior to ems arrival and 1 with ems. Febrile temp of 102.1.

## 2020-08-06 ENCOUNTER — Encounter (HOSPITAL_COMMUNITY): Payer: Medicare HMO

## 2020-08-06 DIAGNOSIS — K921 Melena: Secondary | ICD-10-CM | POA: Diagnosis present

## 2020-08-06 DIAGNOSIS — I480 Paroxysmal atrial fibrillation: Secondary | ICD-10-CM | POA: Diagnosis not present

## 2020-08-06 DIAGNOSIS — B9620 Unspecified Escherichia coli [E. coli] as the cause of diseases classified elsewhere: Secondary | ICD-10-CM | POA: Diagnosis not present

## 2020-08-06 DIAGNOSIS — Z85820 Personal history of malignant melanoma of skin: Secondary | ICD-10-CM | POA: Diagnosis not present

## 2020-08-06 DIAGNOSIS — N390 Urinary tract infection, site not specified: Secondary | ICD-10-CM | POA: Diagnosis not present

## 2020-08-06 DIAGNOSIS — T8140XA Infection following a procedure, unspecified, initial encounter: Secondary | ICD-10-CM | POA: Diagnosis present

## 2020-08-06 DIAGNOSIS — R7881 Bacteremia: Secondary | ICD-10-CM | POA: Diagnosis not present

## 2020-08-06 DIAGNOSIS — E785 Hyperlipidemia, unspecified: Secondary | ICD-10-CM | POA: Diagnosis present

## 2020-08-06 DIAGNOSIS — R319 Hematuria, unspecified: Secondary | ICD-10-CM | POA: Diagnosis not present

## 2020-08-06 DIAGNOSIS — E872 Acidosis: Secondary | ICD-10-CM | POA: Diagnosis present

## 2020-08-06 DIAGNOSIS — Z20822 Contact with and (suspected) exposure to covid-19: Secondary | ICD-10-CM | POA: Diagnosis present

## 2020-08-06 DIAGNOSIS — D696 Thrombocytopenia, unspecified: Secondary | ICD-10-CM | POA: Diagnosis present

## 2020-08-06 DIAGNOSIS — R5081 Fever presenting with conditions classified elsewhere: Secondary | ICD-10-CM | POA: Diagnosis not present

## 2020-08-06 DIAGNOSIS — R000 Tachycardia, unspecified: Secondary | ICD-10-CM | POA: Diagnosis present

## 2020-08-06 DIAGNOSIS — I10 Essential (primary) hypertension: Secondary | ICD-10-CM | POA: Diagnosis not present

## 2020-08-06 DIAGNOSIS — N179 Acute kidney failure, unspecified: Secondary | ICD-10-CM | POA: Diagnosis not present

## 2020-08-06 DIAGNOSIS — Z96652 Presence of left artificial knee joint: Secondary | ICD-10-CM | POA: Diagnosis present

## 2020-08-06 DIAGNOSIS — R12 Heartburn: Secondary | ICD-10-CM | POA: Diagnosis not present

## 2020-08-06 DIAGNOSIS — Z820 Family history of epilepsy and other diseases of the nervous system: Secondary | ICD-10-CM | POA: Diagnosis not present

## 2020-08-06 DIAGNOSIS — R3911 Hesitancy of micturition: Secondary | ICD-10-CM | POA: Diagnosis present

## 2020-08-06 DIAGNOSIS — Z8249 Family history of ischemic heart disease and other diseases of the circulatory system: Secondary | ICD-10-CM | POA: Diagnosis not present

## 2020-08-06 DIAGNOSIS — A4151 Sepsis due to Escherichia coli [E. coli]: Secondary | ICD-10-CM | POA: Diagnosis not present

## 2020-08-06 DIAGNOSIS — Z79899 Other long term (current) drug therapy: Secondary | ICD-10-CM | POA: Diagnosis not present

## 2020-08-06 DIAGNOSIS — Z87442 Personal history of urinary calculi: Secondary | ICD-10-CM | POA: Diagnosis not present

## 2020-08-06 DIAGNOSIS — Z72820 Sleep deprivation: Secondary | ICD-10-CM | POA: Diagnosis not present

## 2020-08-06 DIAGNOSIS — R350 Frequency of micturition: Secondary | ICD-10-CM | POA: Diagnosis not present

## 2020-08-06 DIAGNOSIS — K219 Gastro-esophageal reflux disease without esophagitis: Secondary | ICD-10-CM | POA: Diagnosis present

## 2020-08-06 DIAGNOSIS — R1110 Vomiting, unspecified: Secondary | ICD-10-CM | POA: Diagnosis present

## 2020-08-06 DIAGNOSIS — Z800 Family history of malignant neoplasm of digestive organs: Secondary | ICD-10-CM | POA: Diagnosis not present

## 2020-08-06 LAB — CBC
HCT: 41.5 % (ref 39.0–52.0)
Hemoglobin: 13.4 g/dL (ref 13.0–17.0)
MCH: 29.1 pg (ref 26.0–34.0)
MCHC: 32.3 g/dL (ref 30.0–36.0)
MCV: 90 fL (ref 80.0–100.0)
Platelets: 105 10*3/uL — ABNORMAL LOW (ref 150–400)
RBC: 4.61 MIL/uL (ref 4.22–5.81)
RDW: 13.9 % (ref 11.5–15.5)
WBC: 4.6 10*3/uL (ref 4.0–10.5)
nRBC: 0 % (ref 0.0–0.2)

## 2020-08-06 LAB — COMPREHENSIVE METABOLIC PANEL
ALT: 49 U/L — ABNORMAL HIGH (ref 0–44)
AST: 44 U/L — ABNORMAL HIGH (ref 15–41)
Albumin: 2.8 g/dL — ABNORMAL LOW (ref 3.5–5.0)
Alkaline Phosphatase: 54 U/L (ref 38–126)
Anion gap: 9 (ref 5–15)
BUN: 19 mg/dL (ref 8–23)
CO2: 23 mmol/L (ref 22–32)
Calcium: 8.4 mg/dL — ABNORMAL LOW (ref 8.9–10.3)
Chloride: 105 mmol/L (ref 98–111)
Creatinine, Ser: 0.95 mg/dL (ref 0.61–1.24)
GFR calc Af Amer: >60 mL/min (ref >60)
GFR calc non Af Amer: >60 mL/min (ref >60)
Glucose, Bld: 111 mg/dL — ABNORMAL HIGH (ref 70–99)
Potassium: 3.9 mmol/L (ref 3.5–5.1)
Sodium: 137 mmol/L (ref 135–145)
Total Bilirubin: 0.8 mg/dL (ref 0.3–1.2)
Total Protein: 5.2 g/dL — ABNORMAL LOW (ref 6.5–8.1)

## 2020-08-06 LAB — PROTIME-INR
INR: 1.3 — ABNORMAL HIGH (ref 0.8–1.2)
Prothrombin Time: 15.2 seconds (ref 11.4–15.2)

## 2020-08-06 LAB — URINE CULTURE: Culture: >=100000 — AB

## 2020-08-06 LAB — MAGNESIUM: Magnesium: 1.9 mg/dL (ref 1.7–2.4)

## 2020-08-06 MED ORDER — SODIUM CHLORIDE 0.9 % IV SOLN
2.0000 g | INTRAVENOUS | Status: DC
  Administered 2020-08-06 – 2020-08-07 (×2): 2 g via INTRAVENOUS
  Filled 2020-08-06 (×2): qty 2

## 2020-08-06 MED ORDER — ALUM & MAG HYDROXIDE-SIMETH 200-200-20 MG/5ML PO SUSP
30.0000 mL | Freq: Four times a day (QID) | ORAL | Status: DC | PRN
  Administered 2020-08-06 – 2020-08-07 (×2): 30 mL via ORAL
  Filled 2020-08-06 (×2): qty 30

## 2020-08-06 MED ORDER — TAMSULOSIN HCL 0.4 MG PO CAPS
0.4000 mg | ORAL_CAPSULE | Freq: Every day | ORAL | Status: DC
  Administered 2020-08-06 – 2020-08-07 (×2): 0.4 mg via ORAL
  Filled 2020-08-06 (×2): qty 1

## 2020-08-06 NOTE — Progress Notes (Signed)
Subjective: CC: Fever.  8/12: David Vargas is afebrile with a normal WBC count and normalization of his Creatinine.  His blood and urine cultures are growing e. Coli with sensitivities pending.  He has some nausea and malaise.  He is voiding with some intermittency and frequency.  He is concerned about the lack of a bowel movement because he is generally regular but has no urge to have a BM.    Hx: David Vargas had a routine prostate biopsy on 08/03/20 in the office with levaquin for the prep.  He did well until yesterday morning when he experienced a fever and chills.  I spoke to him yesterday afternoon and he was feeling better but I sent bactrim but after the first dose he had more severe fever and chills with some urgency and frequency and was taken to [facility] by EMS after he had some confusion.  He has e. Coli on the blood culture ID panel and is now on meropenem with an improving fever curve and lactate level. He has AKI with a Cr of 1.26.  He is voiding but still has some frequency.  ROS:  Review of Systems  Musculoskeletal: Falls:    All other systems reviewed and are negative.   Anti-infectives: Anti-infectives (From admission, onward)   Start     Dose/Rate Route Frequency Ordered Stop   08/06/20 0400  cefTRIAXone (ROCEPHIN) 1 g in sodium chloride 0.9 % 100 mL IVPB  Status:  Discontinued        1 g 200 mL/hr over 30 Minutes Intravenous Every 24 hours 08/05/20 0633 08/05/20 1200   08/05/20 1230  meropenem (MERREM) 1 g in sodium chloride 0.9 % 100 mL IVPB     Discontinue     1 g 200 mL/hr over 30 Minutes Intravenous Every 8 hours 08/05/20 1222     08/05/20 0215  cefTRIAXone (ROCEPHIN) 2 g in sodium chloride 0.9 % 100 mL IVPB        2 g 200 mL/hr over 30 Minutes Intravenous  Once 08/05/20 0204 08/05/20 0420   08/05/20 0215  metroNIDAZOLE (FLAGYL) IVPB 500 mg        500 mg 100 mL/hr over 60 Minutes Intravenous  Once 08/05/20 0204 08/05/20 0420      Current Facility-Administered Medications   Medication Dose Route Frequency Provider Last Rate Last Admin  . acetaminophen (TYLENOL) tablet 650 mg  650 mg Oral Q6H PRN Zierle-Ghosh, Asia B, DO   650 mg at 08/06/20 0458   Or  . acetaminophen (TYLENOL) suppository 650 mg  650 mg Rectal Q6H PRN Zierle-Ghosh, Asia B, DO      . atorvastatin (LIPITOR) tablet 20 mg  20 mg Oral QHS Zierle-Ghosh, Asia B, DO   20 mg at 08/05/20 2003  . melatonin tablet 5 mg  5 mg Oral QHS PRN Zierle-Ghosh, Asia B, DO   5 mg at 08/05/20 2343  . meropenem (MERREM) 1 g in sodium chloride 0.9 % 100 mL IVPB  1 g Intravenous Q8H Alekh, Kshitiz, MD 200 mL/hr at 08/06/20 0455 1 g at 08/06/20 0455  . morphine 2 MG/ML injection 2 mg  2 mg Intravenous Q2H PRN Zierle-Ghosh, Asia B, DO      . ondansetron (ZOFRAN) tablet 4 mg  4 mg Oral Q6H PRN Zierle-Ghosh, Asia B, DO       Or  . ondansetron (ZOFRAN) injection 4 mg  4 mg Intravenous Q6H PRN Zierle-Ghosh, Asia B, DO   4 mg at 08/05/20 2340  . oxyCODONE (  Oxy IR/ROXICODONE) immediate release tablet 5 mg  5 mg Oral Q4H PRN Zierle-Ghosh, Asia B, DO      . polyethylene glycol (MIRALAX / GLYCOLAX) packet 17 g  17 g Oral Daily PRN Zierle-Ghosh, Asia B, DO      . tamsulosin (FLOMAX) capsule 0.4 mg  0.4 mg Oral Daily Irine Seal, MD         Objective: Vital signs in last 24 hours: Temp:  [97.5 F (36.4 C)-98.9 F (37.2 C)] 98.6 F (37 C) (08/12 0455) Pulse Rate:  [67-98] 70 (08/12 0455) Resp:  [16-18] 16 (08/12 0455) BP: (106-133)/(69-88) 129/88 (08/12 0455) SpO2:  [96 %-98 %] 97 % (08/12 0455)  Intake/Output from previous day: 08/11 0701 - 08/12 0700 In: 1200 [P.O.:1000; IV Piggyback:200] Out: 200 [Urine:200] Intake/Output this shift: No intake/output data recorded.   Physical Exam Vitals reviewed.  Constitutional:      Appearance: Normal appearance.  Abdominal:     Palpations: Abdomen is soft. There is no mass.     Tenderness: There is no abdominal tenderness.  Neurological:     Mental Status: He is alert.      Lab Results:  Recent Labs    08/05/20 0218 08/06/20 0246  WBC 9.5 4.6  HGB 14.9 13.4  HCT 46.4 41.5  PLT 131* 105*   BMET Recent Labs    08/05/20 0218 08/06/20 0246  NA 138 137  K 4.1 3.9  CL 103 105  CO2 25 23  GLUCOSE 116* 111*  BUN 20 19  CREATININE 1.26* 0.95  CALCIUM 9.0 8.4*   PT/INR Recent Labs    08/05/20 0218 08/06/20 0246  LABPROT 14.9 15.2  INR 1.2 1.3*   ABG No results for input(s): PHART, HCO3 in the last 72 hours.  Invalid input(s): PCO2, PO2  Studies/Results: DG Chest Port 1 View  Result Date: 08/05/2020 CLINICAL DATA:  Vomiting and tremors EXAM: PORTABLE CHEST 1 VIEW COMPARISON:  None. FINDINGS: The heart size and mediastinal contours are within normal limits. Both lungs are clear. The visualized skeletal structures are unremarkable. IMPRESSION: No active disease. Electronically Signed   By: Prudencio Pair M.D.   On: 08/05/2020 02:25   Note and labs reviewed.   Assessment and Plan: Post prostate biopsy sepsis.  Blood and Urine cultures are growing e. Coli with sensitivities pending.  Continue current therapy.     Urinary frequency and intermittency.   I will get a  Bladder scan PVR and start him on tamsulosin.       LOS: 0 days    Irine Seal 08/06/[id number]

## 2020-08-06 NOTE — Progress Notes (Addendum)
PT Screen   Patient Details Name: David Vargas MRN: [id number] DOB: 01/24/1951   Pt reports playing 18 holes of golf at least once a week and no hx of falls. PT screen completed along with a basic balance screen. The patient has no acute PT needs or follow up needs at this time.  PT to sign off. Thank you for the referral.  Benjamine Mola B. Migdalia Dk PT, DPT Acute Rehabilitation Services Pager [phone] Office [phone]    [place] 08/06/2020, 9:04 AM

## 2020-08-06 NOTE — Progress Notes (Signed)
Patient ID: David Vargas, male   DOB: [birth year], 69 y.o.   MRN: [id number]  PROGRESS NOTE    David Vargas  ZWC:[id number] DOB: 1951/06/17 DOA: 08/05/2020 PCP: Hulan Fess, MD   Brief Narrative:  69 year old male with history of hypertension, nephrolithiasis, GERD, paroxysmal A. fib not on anticoagulation, recent prostate biopsy on 08/03/2020 by urology/Dr. Jeffie Pollock presented on 08/05/2020 with fever and chills along with nausea/vomiting and hematuria.  In the ED, he had a temperature of 100.5, WBC count of 9.5 along with tachycardia.  He was started on broad-spectrum antibiotics.  UA was positive for leukocytes and nitrates.  Chest x-ray did not show any evidence of pneumonia.  COVID-19 test was negative.  Urology was also consulted.  Assessment & Plan:  E. coli bacteremia Probable complicated UTI in a patient with recent prostate biopsy -Currently on meropenem as per urology recommendations.  E. coli is sensitive to Rocephin.  Will switch back to Rocephin. -No temperature spikes over the last 24 hours.  Hematuria -Probably secondary to recent prostate biopsy.  Neurology has started the patient on tamsulosin  Thrombocytopenia -Questionable cause.  Monitor.  Hypertension -Monitor blood pressure.  Currently stable.  Paroxysmal A. fib -Currently rate controlled.  Not on anticoagulation.  Generalized deconditioning -Patient tolerated PT eval.   DVT prophylaxis: SCDs Code Status: Full Family Communication: Patient at bedside Disposition Plan: Status is: Observation  The patient will require care spanning > 2 midnights and should be moved to inpatient because: Inpatient level of care appropriate due to severity of illness  Dispo: The patient is from: Home              Anticipated d/c is to: Home              Anticipated d/c date is: 1 day              Patient currently is medically stable to d/c.   Consultants: Urology Procedures: None  Antimicrobials:  Anti-infectives  (From admission, onward)   Start     Dose/Rate Route Frequency Ordered Stop   08/06/20 0400  cefTRIAXone (ROCEPHIN) 1 g in sodium chloride 0.9 % 100 mL IVPB  Status:  Discontinued        1 g 200 mL/hr over 30 Minutes Intravenous Every 24 hours 08/05/20 0633 08/05/20 1200   08/05/20 1230  meropenem (MERREM) 1 g in sodium chloride 0.9 % 100 mL IVPB     Discontinue     1 g 200 mL/hr over 30 Minutes Intravenous Every 8 hours 08/05/20 1222     08/05/20 0215  cefTRIAXone (ROCEPHIN) 2 g in sodium chloride 0.9 % 100 mL IVPB        2 g 200 mL/hr over 30 Minutes Intravenous  Once 08/05/20 0204 08/05/20 0420   08/05/20 0215  metroNIDAZOLE (FLAGYL) IVPB 500 mg        500 mg 100 mL/hr over 60 Minutes Intravenous  Once 08/05/20 0204 08/05/20 0420       Subjective: Patient seen and examined at bedside.  Feels weak.  Denies overnight fever, nausea or vomiting.  Still complains of some urinary hesitancy and frequency.  Bleeding in the urine is improving. Objective: Vitals:   08/05/20 1819 08/05/20 2009 08/06/20 0152 08/06/20 0455  BP: 121/73 113/80 133/84 129/88  Pulse: 79 74 73 70  Resp: 18 18 16 16   Temp: 98.2 F (36.8 C) 98.5 F (36.9 C) (!) 97.5 F (36.4 C) 98.6 F (37 C)  TempSrc: Oral Oral  Oral  SpO2: 98% 96% 97% 97%  Weight:      Height:        Intake/Output Summary (Last 24 hours) at 08/06/2020 0753 Last data filed at 08/06/2020 0505 Gross per 24 hour  Intake 1200 ml  Output 200 ml  Net 1000 ml   Filed Weights   08/05/20 0141  Weight: 98.9 kg    Examination:  General exam: Appears calm and comfortable.  Elderly male lying in bed. Respiratory system: Bilateral decreased breath sounds at bases Cardiovascular system: S1 & S2 heard, Rate controlled Gastrointestinal system: Abdomen is nondistended, soft and nontender. Normal bowel sounds heard. Extremities: No cyanosis, clubbing, edema  Central nervous system: Alert and oriented. No focal neurological deficits. Moving  extremities Skin: No rashes, lesions or ulcers Psychiatry: Mood, affect and judgment seem normal.   Data Reviewed: I have personally reviewed following labs and imaging studies  CBC: Recent Labs  Lab 08/05/20 0218 08/06/20 0246  WBC 9.5 4.6  NEUTROABS 9.0*  --   HGB 14.9 13.4  HCT 46.4 41.5  MCV 91.7 90.0  PLT 131* 867*   Basic Metabolic Panel: Recent Labs  Lab 08/05/20 0218 08/06/20 0246  NA 138 137  K 4.1 3.9  CL 103 105  CO2 25 23  GLUCOSE 116* 111*  BUN 20 19  CREATININE 1.26* 0.95  CALCIUM 9.0 8.4*  MG  --  1.9   GFR: Estimated Creatinine Clearance: 88.8 mL/min (by C-G formula based on SCr of 0.95 mg/dL). Liver Function Tests: Recent Labs  Lab 08/05/20 0218 08/06/20 0246  AST 29 44*  ALT 26 49*  ALKPHOS 67 54  BILITOT 1.1 0.8  PROT 6.3* 5.2*  ALBUMIN 3.5 2.8*   No results for input(s): LIPASE, AMYLASE in the last 168 hours. No results for input(s): AMMONIA in the last 168 hours. Coagulation Profile: Recent Labs  Lab 08/05/20 0218 08/06/20 0246  INR 1.2 1.3*   Cardiac Enzymes: No results for input(s): CKTOTAL, CKMB, CKMBINDEX, TROPONINI in the last 168 hours. BNP (last 3 results) No results for input(s): PROBNP in the last 8760 hours. HbA1C: No results for input(s): HGBA1C in the last 72 hours. CBG: No results for input(s): GLUCAP in the last 168 hours. Lipid Profile: No results for input(s): CHOL, HDL, LDLCALC, TRIG, CHOLHDL, LDLDIRECT in the last 72 hours. Thyroid Function Tests: No results for input(s): TSH, T4TOTAL, FREET4, T3FREE, THYROIDAB in the last 72 hours. Anemia Panel: No results for input(s): VITAMINB12, FOLATE, FERRITIN, TIBC, IRON, RETICCTPCT in the last 72 hours. Sepsis Labs: Recent Labs  Lab 08/05/20 0218 08/05/20 0404  LATICACIDVEN 2.0* 1.7    Recent Results (from the past 240 hour(s))  Blood culture (routine single)     Status: None (Preliminary result)   Collection Time: 08/05/20  2:39 AM   Specimen: BLOOD   Result Value Ref Range Status   Specimen Description BLOOD SITE NOT SPECIFIED  Final   Special Requests   Final    BOTTLES DRAWN AEROBIC AND ANAEROBIC Blood Culture results may not be optimal due to an inadequate volume of blood received in culture bottles   Culture  Setup Time   Final    GRAM NEGATIVE RODS IN BOTH AEROBIC AND ANAEROBIC BOTTLES Organism ID to follow CRITICAL RESULT CALLED TO, READ BACK BY AND VERIFIED WITH: B,MANCHERIL PHARMD @1721  08/05/20 EB Performed at [facility], [place] [address], [place], [place] [address]    Culture PENDING  Incomplete   Report Status PENDING  Incomplete  Blood Culture ID Panel (Reflexed)     Status: Abnormal   Collection Time: 08/05/20  2:39 AM  Result Value Ref Range Status   Enterococcus faecalis NOT DETECTED NOT DETECTED Final   Enterococcus Faecium NOT DETECTED NOT DETECTED Final   Listeria monocytogenes NOT DETECTED NOT DETECTED Final   Staphylococcus species NOT DETECTED NOT DETECTED Final   Staphylococcus aureus (BCID) NOT DETECTED NOT DETECTED Final   Staphylococcus epidermidis NOT DETECTED NOT DETECTED Final   Staphylococcus lugdunensis NOT DETECTED NOT DETECTED Final   Streptococcus species NOT DETECTED NOT DETECTED Final   Streptococcus agalactiae NOT DETECTED NOT DETECTED Final   Streptococcus pneumoniae NOT DETECTED NOT DETECTED Final   Streptococcus pyogenes NOT DETECTED NOT DETECTED Final   A.calcoaceticus-baumannii NOT DETECTED NOT DETECTED Final   Bacteroides fragilis NOT DETECTED NOT DETECTED Final   Enterobacterales DETECTED (A) NOT DETECTED Final    Comment: Enterobacterales represent a large order of gram negative bacteria, not a single organism. RESULT CALLED TO, READ BACK BY AND VERIFIED WITH: B,MANCHERIL @1721  08/05/20 EB    Enterobacter cloacae complex NOT DETECTED NOT DETECTED Final   Escherichia coli DETECTED (A) NOT DETECTED Final    Comment: RESULT CALLED TO, READ BACK BY AND VERIFIED WITH: B,MANCHERIL  @1721  08/05/20 EB    Klebsiella aerogenes NOT DETECTED NOT DETECTED Final   Klebsiella oxytoca NOT DETECTED NOT DETECTED Final   Klebsiella pneumoniae NOT DETECTED NOT DETECTED Final   Proteus species NOT DETECTED NOT DETECTED Final   Salmonella species NOT DETECTED NOT DETECTED Final   Serratia marcescens NOT DETECTED NOT DETECTED Final   Haemophilus influenzae NOT DETECTED NOT DETECTED Final   Neisseria meningitidis NOT DETECTED NOT DETECTED Final   Pseudomonas aeruginosa NOT DETECTED NOT DETECTED Final   Stenotrophomonas maltophilia NOT DETECTED NOT DETECTED Final   Candida albicans NOT DETECTED NOT DETECTED Final   Candida auris NOT DETECTED NOT DETECTED Final   Candida glabrata NOT DETECTED NOT DETECTED Final   Candida krusei NOT DETECTED NOT DETECTED Final   Candida parapsilosis NOT DETECTED NOT DETECTED Final   Candida tropicalis NOT DETECTED NOT DETECTED Final   Cryptococcus neoformans/gattii NOT DETECTED NOT DETECTED Final   CTX-M ESBL NOT DETECTED NOT DETECTED Final   Carbapenem resistance IMP NOT DETECTED NOT DETECTED Final   Carbapenem resistance KPC NOT DETECTED NOT DETECTED Final   Carbapenem resistance NDM NOT DETECTED NOT DETECTED Final   Carbapenem resist OXA 48 LIKE NOT DETECTED NOT DETECTED Final   Carbapenem resistance VIM NOT DETECTED NOT DETECTED Final    Comment: Performed at [facility], [address], [place], [place] [address]  Urine culture     Status: Abnormal   Collection Time: 08/05/20  2:40 AM   Specimen: In/Out Cath Urine  Result Value Ref Range Status   Specimen Description IN/OUT CATH URINE  Final   Special Requests   Final    NONE Performed at [facility], [place] [address], [place], [place] [address]    Culture >=100,000 COLONIES/mL ESCHERICHIA COLI (A)  Final   Report Status 08/06/2020 FINAL  Final   Organism ID, Bacteria ESCHERICHIA COLI (A)  Final      Susceptibility   Escherichia coli - MIC*    AMPICILLIN >=32 RESISTANT  Resistant     CEFAZOLIN <=4 SENSITIVE Sensitive     CEFTRIAXONE <=0.25 SENSITIVE Sensitive     CIPROFLOXACIN >=4 RESISTANT Resistant     GENTAMICIN >=16 RESISTANT Resistant     IMIPENEM <=0.25 SENSITIVE  Sensitive     NITROFURANTOIN 32 SENSITIVE Sensitive     TRIMETH/SULFA >=320 RESISTANT Resistant     AMPICILLIN/SULBACTAM 8 SENSITIVE Sensitive     PIP/TAZO <=4 SENSITIVE Sensitive     * >=100,000 COLONIES/mL ESCHERICHIA COLI  SARS Coronavirus 2 by RT PCR (hospital order, performed in [facility] hospital lab) Nasopharyngeal Nasopharyngeal Swab     Status: None   Collection Time: 08/05/20  4:34 AM   Specimen: Nasopharyngeal Swab  Result Value Ref Range Status   SARS Coronavirus 2 NEGATIVE NEGATIVE Final    Comment: (NOTE) SARS-CoV-2 target nucleic acids are NOT DETECTED.  The SARS-CoV-2 RNA is generally detectable in upper and lower respiratory specimens during the acute phase of infection. The lowest concentration of SARS-CoV-2 viral copies this assay can detect is 250 copies / mL. A negative result does not preclude SARS-CoV-2 infection and should not be used as the sole basis for treatment or other patient management decisions.  A negative result may occur with improper specimen collection / handling, submission of specimen other than nasopharyngeal swab, presence of viral mutation(s) within the areas targeted by this assay, and inadequate number of viral copies (<250 copies / mL). A negative result must be combined with clinical observations, patient history, and epidemiological information.  Fact Sheet for Patients:   StrictlyIdeas.no  Fact Sheet for Healthcare Providers: BankingDealers.co.za  This test is not yet approved or  cleared by the [place] FDA and has been authorized for detection and/or diagnosis of SARS-CoV-2 by FDA under an Emergency Use Authorization (EUA).  This EUA will remain in effect (meaning this test  can be used) for the duration of the COVID-19 declaration under Section 564(b)(1) of the Act, 21 U.S.C. section 360bbb-3(b)(1), unless the authorization is terminated or revoked sooner.  Performed at [facility], [place] [address], [place], [place] [address]          Radiology Studies: DG Chest Port 1 View  Result Date: 08/05/2020 CLINICAL DATA:  Vomiting and tremors EXAM: PORTABLE CHEST 1 VIEW COMPARISON:  None. FINDINGS: The heart size and mediastinal contours are within normal limits. Both lungs are clear. The visualized skeletal structures are unremarkable. IMPRESSION: No active disease. Electronically Signed   By: Prudencio Pair M.D.   On: 08/05/2020 02:25        Scheduled Meds: . atorvastatin  20 mg Oral QHS  . tamsulosin  0.4 mg Oral Daily   Continuous Infusions: . meropenem (MERREM) IV 1 g (08/06/20 0455)          Aline August, MD [facility] 08/06/2020, 7:53 AM

## 2020-08-07 DIAGNOSIS — I480 Paroxysmal atrial fibrillation: Secondary | ICD-10-CM

## 2020-08-07 DIAGNOSIS — R319 Hematuria, unspecified: Secondary | ICD-10-CM

## 2020-08-07 LAB — CBC WITH DIFFERENTIAL/PLATELET
Abs Immature Granulocytes: 0.01 10*3/uL (ref 0.00–0.07)
Basophils Absolute: 0 10*3/uL (ref 0.0–0.1)
Basophils Relative: 0 %
Eosinophils Absolute: 0 10*3/uL (ref 0.0–0.5)
Eosinophils Relative: 1 %
HCT: 43.4 % (ref 39.0–52.0)
Hemoglobin: 14.1 g/dL (ref 13.0–17.0)
Immature Granulocytes: 0 %
Lymphocytes Relative: 8 %
Lymphs Abs: 0.3 10*3/uL — ABNORMAL LOW (ref 0.7–4.0)
MCH: 28.8 pg (ref 26.0–34.0)
MCHC: 32.5 g/dL (ref 30.0–36.0)
MCV: 88.8 fL (ref 80.0–100.0)
Monocytes Absolute: 0.5 10*3/uL (ref 0.1–1.0)
Monocytes Relative: 13 %
Neutro Abs: 3.1 10*3/uL (ref 1.7–7.7)
Neutrophils Relative %: 78 %
Platelets: 113 10*3/uL — ABNORMAL LOW (ref 150–400)
RBC: 4.89 MIL/uL (ref 4.22–5.81)
RDW: 13.6 % (ref 11.5–15.5)
WBC: 4 10*3/uL (ref 4.0–10.5)
nRBC: 0 % (ref 0.0–0.2)

## 2020-08-07 LAB — CULTURE, BLOOD (SINGLE)

## 2020-08-07 LAB — BASIC METABOLIC PANEL
Anion gap: 8 (ref 5–15)
BUN: 17 mg/dL (ref 8–23)
CO2: 24 mmol/L (ref 22–32)
Calcium: 8.6 mg/dL — ABNORMAL LOW (ref 8.9–10.3)
Chloride: 104 mmol/L (ref 98–111)
Creatinine, Ser: 0.94 mg/dL (ref 0.61–1.24)
GFR calc Af Amer: >60 mL/min (ref >60)
GFR calc non Af Amer: >60 mL/min (ref >60)
Glucose, Bld: 129 mg/dL — ABNORMAL HIGH (ref 70–99)
Potassium: 4.2 mmol/L (ref 3.5–5.1)
Sodium: 136 mmol/L (ref 135–145)

## 2020-08-07 LAB — MAGNESIUM: Magnesium: 1.9 mg/dL (ref 1.7–2.4)

## 2020-08-07 MED ORDER — ATORVASTATIN CALCIUM 20 MG PO TABS
20.0000 mg | ORAL_TABLET | Freq: Every day | ORAL | Status: DC
Start: 2020-08-07 — End: 2020-09-17

## 2020-08-07 MED ORDER — CEPHALEXIN 500 MG PO CAPS: 500.0000 mg | ORAL_CAPSULE | Freq: Four times a day (QID) | ORAL | 0 refills | Status: AC

## 2020-08-07 MED ORDER — TAMSULOSIN HCL 0.4 MG PO CAPS: 0.4000 mg | ORAL_CAPSULE | Freq: Every day | ORAL | Status: DC

## 2020-08-07 NOTE — Plan of Care (Signed)

## 2020-08-07 NOTE — Progress Notes (Signed)
[name] to be D/C'd  per MD order. Discussed with the patient and all questions fully answered.  VSS, Skin clean, dry and intact without evidence of skin break down, no evidence of skin tears noted.  IV catheter discontinued intact. Site without signs and symptoms of complications. Dressing and pressure applied.  An After Visit Summary was printed and given to the patient. Patient received prescription.  D/c education completed with patient/family including follow up instructions, medication list, d/c activities limitations if indicated, with other d/c instructions as indicated by MD - patient able to verbalize understanding, all questions fully answered.   Patient instructed to return to ED, call 911, or call MD for any changes in condition.   Patient to be escorted via [address], and D/C home via private auto.

## 2020-08-07 NOTE — Discharge Summary (Addendum)
Physician Discharge Summary  David Vargas ATF:[id number] DOB: 11/20/1951 DOA: 08/05/2020  PCP: David Fess, MD  Admit date: 08/05/2020 Discharge date: 08/07/2020  Admitted From: Home Disposition: Home  Recommendations for Outpatient Follow-up:  1. Follow up with PCP in 1 week  2. Outpatient follow-up with urology 3. Follow up in ED if symptoms worsen or new appear   Home Health: No Equipment/Devices: None  Discharge Condition: Stable CODE STATUS: Full Diet recommendation: Heart healthy  Brief/Interim Summary: 69 year old male with history of hypertension, nephrolithiasis, GERD, paroxysmal A. fib not on anticoagulation, recent prostate biopsy on 08/03/2020 by urology/Dr. Jeffie Vargas presented on 08/05/2020 with fever and chills along with nausea/vomiting and hematuria.  In the ED, he had a temperature of 100.5, WBC count of 9.5 along with tachycardia.  He was started on broad-spectrum antibiotics.  UA was positive for leukocytes and nitrates.  Chest x-ray did not show any evidence of pneumonia.  COVID-19 test was negative.  Urology was also consulted.  He was found to have E. coli bacteremia.  Meropenem was switched to Rocephin.  He is currently afebrile, hemodynamically stable, tolerating diet.  He will be discharged on Keflex for 10 more days with outpatient follow-up with urology.   Discharge Diagnoses:   E. coli bacteremia Probable complicated UTI in a patient with recent prostate biopsy Sepsis: ruled out -Treated with meropenem initially which was switched to Rocephin. E. coli is sensitive to Rocephin.  -No temperature spikes over the last 48 hours. -hemodynamically stable, tolerating diet.  He will be discharged on Keflex for 10 more days with outpatient follow-up with urology.   Hematuria -Probably secondary to recent prostate biopsy.  urology has started the patient on tamsulosin -Hematuria improving.  Outpatient follow-up with urology  Thrombocytopenia -Questionable cause.     Outpatient follow-up.  Hypertension -Monitor blood pressure.  Currently stable.  Paroxysmal A. fib -Currently rate controlled.  Not on anticoagulation.  Generalized deconditioning -Patient tolerated PT eval.  Discharge Instructions  Discharge Instructions    Diet - low sodium heart healthy   Complete by: As directed    Increase activity slowly   Complete by: As directed      Allergies as of 08/07/2020   No Known Allergies     Medication List    STOP taking these medications   levofloxacin 750 MG tablet Commonly known as: LEVAQUIN   sulfamethoxazole-trimethoprim 800-160 MG tablet Commonly known as: BACTRIM DS     TAKE these medications   acetaminophen 325 MG tablet Commonly known as: TYLENOL Take 650 mg by mouth every 6 (six) hours as needed for mild pain or fever.   atorvastatin 20 MG tablet Commonly known as: LIPITOR Take 1 tablet (20 mg total) by mouth at bedtime.   cephALEXin 500 MG capsule Commonly known as: KEFLEX Take 1 capsule (500 mg total) by mouth 4 (four) times daily for 10 days.   melatonin 5 MG Tabs Take 5 mg by mouth at bedtime as needed (for sleep).   tadalafil 10 MG tablet Commonly known as: CIALIS Take 5 mg by mouth daily as needed for erectile dysfunction.   tamsulosin 0.4 MG Caps capsule Commonly known as: FLOMAX Take 1 capsule (0.4 mg total) by mouth daily after supper. What changed:   when to take this  reasons to take this       Follow-up Information    Little, Lennette Bihari, MD. Schedule an appointment as soon as possible for a visit in 1 week(s).   Specialty: Family Medicine Contact information:  [place] [address] [phone]        David Margarita, MD .   Specialty: Cardiology Contact information: [id number] [place] [address] [phone]        David Seal, MD. Schedule an appointment as soon as possible for a visit in 1 week(s).   Specialty: Urology Contact  information: [place] [address] [phone]              No Known Allergies  Consultations:  Urology   Procedures/Studies: CT CHEST W CONTRAST  Result Date: 07/09/2020 CLINICAL DATA:  69 year old male with history of multiple pulmonary nodules in the lungs on prior CT examination. Follow-up study. EXAM: CT CHEST WITH CONTRAST TECHNIQUE: Multidetector CT imaging of the chest was performed during intravenous contrast administration. CONTRAST:  57mL OMNIPAQUE IOHEXOL 300 MG/ML  SOLN COMPARISON:  Chest CT 04/09/2018. FINDINGS: Cardiovascular: Heart size is normal. There is no significant pericardial fluid, thickening or pericardial calcification. There is aortic atherosclerosis, as well as atherosclerosis of the great vessels of the mediastinum and the coronary arteries, including calcified atherosclerotic plaque in the left anterior descending and right coronary arteries. Calcifications of the aortic valve. Mediastinum/Nodes: No pathologically enlarged mediastinal or hilar lymph nodes. Please note that accurate exclusion of hilar adenopathy is limited on noncontrast CT scans. Several densely calcified mediastinal and bilateral hilar lymph nodes. Esophagus is unremarkable in appearance. No axillary lymphadenopathy. Lungs/Pleura: Again noted are numerous solid and sub solid pulmonary nodules scattered throughout the lungs bilaterally, stable in size, number and distribution compared to the prior study from 04/09/2018, considered definitively benign. Peripheral area of architectural distortion with ground-glass attenuation and some solid component in the periphery of the right upper lobe (axial image 45 of series 7) is also unchanged, most compatible with an area of chronic post infectious or inflammatory scarring. No new or clearly enlarging pulmonary nodules or masses are otherwise noted. No acute consolidative airspace disease. No pleural effusions. Upper Abdomen: Low-attenuation  lesions are noted throughout the liver, largest of which is in segment 6 of the liver measuring 3.0 x 3.2 cm, compatible with simple cysts. Low-attenuation lesions in the kidneys bilaterally also compatible with simple cysts, largest of which is in the upper pole the left kidney measuring 8.1 x 6.4 cm. Aortic atherosclerosis. Musculoskeletal: There are no aggressive appearing lytic or blastic lesions noted in the visualized portions of the skeleton. IMPRESSION: 1. All previously noted pulmonary nodules appear stable compared to the prior examination and are considered benign. 2. Aortic atherosclerosis, in addition to 2 vessel coronary artery disease. Assessment for potential risk factor modification, dietary therapy or pharmacologic therapy may be warranted, if clinically indicated. 3. There are calcifications of the aortic valve. Echocardiographic correlation for evaluation of potential valvular dysfunction may be warranted if clinically indicated. 4. Additional incidental findings, as above. Aortic Atherosclerosis (ICD10-I70.0). Electronically Signed   By: Vinnie Langton M.D.   On: 07/09/2020 16:12   CT CARDIAC SCORING  Addendum Date: 07/28/2020   ADDENDUM REPORT: 07/28/2020 15:01 CLINICAL DATA:  Risk stratification EXAM: Coronary Calcium Score TECHNIQUE: The patient was scanned on a [employer]. Axial non-contrast 3 mm slices were carried out through the heart. The data set was analyzed on a dedicated work station and scored using the [employer]. FINDINGS: Non-cardiac: See separate report from [facility]. Ascending Aorta: Mildly dilated ascending aorta at 4cm. Scattered calcifications. Pericardium: Normal Coronary arteries: Normal coronary origins. IMPRESSION: Coronary calcium score  of 105. This was 44th percentile for age and sex matched control. Fransico Him Electronically Signed   By: Fransico Him   On: 07/28/2020 15:01   Result Date: 07/28/2020 EXAM: OVER-READ INTERPRETATION  CT  CHEST The following report is an over-read performed by radiologist Dr. Rolm Baptise of [facility], [place] on 07/28/2020. This over-read does not include interpretation of cardiac or coronary anatomy or pathology. The coronary calcium score interpretation by the cardiologist is attached. COMPARISON:  07/08/2020 FINDINGS: Vascular: Slight aneurysmal dilatation of the ascending thoracic aorta, 4 cm maximally. Heart is normal size. Mediastinum/Nodes: No adenopathy. Calcified bilateral hilar and mediastinal lymph nodes. Lungs/Pleura: Several small pulmonary nodules throughout the lungs bilaterally, stable since prior study. No effusions. Upper Abdomen: No acute findings in the upper abdomen Musculoskeletal: Chest wall soft tissues are unremarkable. No acute bony abnormality. IMPRESSION: Several small bilateral pulmonary nodules, unchanged since prior study and dating back to 2019. No acute extra cardiac abnormality. 4 cm ascending thoracic aortic aneurysm. Recommend annual imaging followup by CTA or MRA. This recommendation follows 2010 ACCF/AHA/AATS/ACR/ASA/SCA/SCAI/SIR/STS/SVM Guidelines for the Diagnosis and Management of Patients with Thoracic Aortic Disease. Circulation. 2010; 121: G315-V761. Aortic aneurysm NOS (ICD10-I71.9) Old granulomatous disease. Electronically Signed: By: Rolm Baptise M.D. On: 07/28/2020 09:43   DG Chest Port 1 View  Result Date: 08/05/2020 CLINICAL DATA:  Vomiting and tremors EXAM: PORTABLE CHEST 1 VIEW COMPARISON:  None. FINDINGS: The heart size and mediastinal contours are within normal limits. Both lungs are clear. The visualized skeletal structures are unremarkable. IMPRESSION: No active disease. Electronically Signed   By: Prudencio Pair M.D.   On: 08/05/2020 02:25       Subjective: Patient seen and examined at bedside.  He had some heartburn overnight which improved with Maalox.  Still has some frequency and hesitancy but denies any fever, nausea or vomiting.  Feels okay  to go home today.  Discharge Exam: Vitals:   08/06/20 2035 08/07/20 0446  BP: 122/87 140/85  Pulse: 80 84  Resp: 17 17  Temp: 98.3 F (36.8 C) 98.4 F (36.9 C)  SpO2: 97% 95%    General: Pt is alert, awake, not in acute distress Cardiovascular: rate controlled, S1/S2 + Respiratory: bilateral decreased breath sounds at bases Abdominal: Soft, NT, ND, bowel sounds + Extremities: no edema, no cyanosis    The results of significant diagnostics from this hospitalization (including imaging, microbiology, ancillary and laboratory) are listed below for reference.     Microbiology: Recent Results (from the past 240 hour(s))  Blood culture (routine single)     Status: Abnormal   Collection Time: 08/05/20  2:39 AM   Specimen: BLOOD  Result Value Ref Range Status   Specimen Description BLOOD SITE NOT SPECIFIED  Final   Special Requests   Final    BOTTLES DRAWN AEROBIC AND ANAEROBIC Blood Culture results may not be optimal due to an inadequate volume of blood received in culture bottles   Culture  Setup Time   Final    GRAM NEGATIVE RODS IN BOTH AEROBIC AND ANAEROBIC BOTTLES Organism ID to follow CRITICAL RESULT CALLED TO, READ BACK BY AND VERIFIED WITH: B,MANCHERIL PHARMD @1721  08/05/20 EB Performed at [facility], [address], [place], [place] [address]    Culture ESCHERICHIA COLI (A)  Final   Report Status 08/07/2020 FINAL  Final   Organism ID, Bacteria ESCHERICHIA COLI  Final      Susceptibility   Escherichia coli - MIC*    AMPICILLIN >=32  RESISTANT Resistant     CEFAZOLIN <=4 SENSITIVE Sensitive     CEFEPIME <=0.12 SENSITIVE Sensitive     CEFTAZIDIME <=1 SENSITIVE Sensitive     CEFTRIAXONE <=0.25 SENSITIVE Sensitive     CIPROFLOXACIN >=4 RESISTANT Resistant     GENTAMICIN >=16 RESISTANT Resistant     IMIPENEM <=0.25 SENSITIVE Sensitive     TRIMETH/SULFA >=320 RESISTANT Resistant     AMPICILLIN/SULBACTAM 4 SENSITIVE Sensitive     PIP/TAZO <=4 SENSITIVE  Sensitive     * ESCHERICHIA COLI  Blood Culture ID Panel (Reflexed)     Status: Abnormal   Collection Time: 08/05/20  2:39 AM  Result Value Ref Range Status   Enterococcus faecalis NOT DETECTED NOT DETECTED Final   Enterococcus Faecium NOT DETECTED NOT DETECTED Final   Listeria monocytogenes NOT DETECTED NOT DETECTED Final   Staphylococcus species NOT DETECTED NOT DETECTED Final   Staphylococcus aureus (BCID) NOT DETECTED NOT DETECTED Final   Staphylococcus epidermidis NOT DETECTED NOT DETECTED Final   Staphylococcus lugdunensis NOT DETECTED NOT DETECTED Final   Streptococcus species NOT DETECTED NOT DETECTED Final   Streptococcus agalactiae NOT DETECTED NOT DETECTED Final   Streptococcus pneumoniae NOT DETECTED NOT DETECTED Final   Streptococcus pyogenes NOT DETECTED NOT DETECTED Final   A.calcoaceticus-baumannii NOT DETECTED NOT DETECTED Final   Bacteroides fragilis NOT DETECTED NOT DETECTED Final   Enterobacterales DETECTED (A) NOT DETECTED Final    Comment: Enterobacterales represent a large order of gram negative bacteria, not a single organism. RESULT CALLED TO, READ BACK BY AND VERIFIED WITH: B,MANCHERIL @1721  08/05/20 EB    Enterobacter cloacae complex NOT DETECTED NOT DETECTED Final   Escherichia coli DETECTED (A) NOT DETECTED Final    Comment: RESULT CALLED TO, READ BACK BY AND VERIFIED WITH: B,MANCHERIL @1721  08/05/20 EB    Klebsiella aerogenes NOT DETECTED NOT DETECTED Final   Klebsiella oxytoca NOT DETECTED NOT DETECTED Final   Klebsiella pneumoniae NOT DETECTED NOT DETECTED Final   Proteus species NOT DETECTED NOT DETECTED Final   Salmonella species NOT DETECTED NOT DETECTED Final   Serratia marcescens NOT DETECTED NOT DETECTED Final   Haemophilus influenzae NOT DETECTED NOT DETECTED Final   Neisseria meningitidis NOT DETECTED NOT DETECTED Final   Pseudomonas aeruginosa NOT DETECTED NOT DETECTED Final   Stenotrophomonas maltophilia NOT DETECTED NOT DETECTED Final    Candida albicans NOT DETECTED NOT DETECTED Final   Candida auris NOT DETECTED NOT DETECTED Final   Candida glabrata NOT DETECTED NOT DETECTED Final   Candida krusei NOT DETECTED NOT DETECTED Final   Candida parapsilosis NOT DETECTED NOT DETECTED Final   Candida tropicalis NOT DETECTED NOT DETECTED Final   Cryptococcus neoformans/gattii NOT DETECTED NOT DETECTED Final   CTX-M ESBL NOT DETECTED NOT DETECTED Final   Carbapenem resistance IMP NOT DETECTED NOT DETECTED Final   Carbapenem resistance KPC NOT DETECTED NOT DETECTED Final   Carbapenem resistance NDM NOT DETECTED NOT DETECTED Final   Carbapenem resist OXA 48 LIKE NOT DETECTED NOT DETECTED Final   Carbapenem resistance VIM NOT DETECTED NOT DETECTED Final    Comment: Performed at [facility], [address], [place], [place] [address]  Urine culture     Status: Abnormal   Collection Time: 08/05/20  2:40 AM   Specimen: In/Out Cath Urine  Result Value Ref Range Status   Specimen Description IN/OUT CATH URINE  Final   Special Requests   Final    NONE Performed at [facility], [place] [address]  [place],  [address]    Culture >=100,000 COLONIES/mL ESCHERICHIA COLI (A)  Final   Report Status 08/06/2020 FINAL  Final   Organism ID, Bacteria ESCHERICHIA COLI (A)  Final      Susceptibility   Escherichia coli - MIC*    AMPICILLIN >=32 RESISTANT Resistant     CEFAZOLIN <=4 SENSITIVE Sensitive     CEFTRIAXONE <=0.25 SENSITIVE Sensitive     CIPROFLOXACIN >=4 RESISTANT Resistant     GENTAMICIN >=16 RESISTANT Resistant     IMIPENEM <=0.25 SENSITIVE Sensitive     NITROFURANTOIN 32 SENSITIVE Sensitive     TRIMETH/SULFA >=320 RESISTANT Resistant     AMPICILLIN/SULBACTAM 8 SENSITIVE Sensitive     PIP/TAZO <=4 SENSITIVE Sensitive     * >=100,000 COLONIES/mL ESCHERICHIA COLI  SARS Coronavirus 2 by RT PCR (hospital order, performed in [place] hospital lab) Nasopharyngeal Nasopharyngeal Swab     Status: None   Collection  Time: 08/05/20  4:34 AM   Specimen: Nasopharyngeal Swab  Result Value Ref Range Status   SARS Coronavirus 2 NEGATIVE NEGATIVE Final    Comment: (NOTE) SARS-CoV-2 target nucleic acids are NOT DETECTED.  The SARS-CoV-2 RNA is generally detectable in upper and lower respiratory specimens during the acute phase of infection. The lowest concentration of SARS-CoV-2 viral copies this assay can detect is 250 copies / mL. A negative result does not preclude SARS-CoV-2 infection and should not be used as the sole basis for treatment or other patient management decisions.  A negative result may occur with improper specimen collection / handling, submission of specimen other than nasopharyngeal swab, presence of viral mutation(s) within the areas targeted by this assay, and inadequate number of viral copies (<250 copies / mL). A negative result must be combined with clinical observations, patient history, and epidemiological information.  Fact Sheet for Patients:   StrictlyIdeas.no  Fact Sheet for Healthcare Providers: BankingDealers.co.za  This test is not yet approved or  cleared by the [place] FDA and has been authorized for detection and/or diagnosis of SARS-CoV-2 by FDA under an Emergency Use Authorization (EUA).  This EUA will remain in effect (meaning this test can be used) for the duration of the COVID-19 declaration under Section 564(b)(1) of the Act, 21 U.S.C. section 360bbb-3(b)(1), unless the authorization is terminated or revoked sooner.  Performed at [facility], [place] [address], [place],  [address]      Labs: BNP (last 3 results) No results for input(s): BNP in the last 8760 hours. Basic Metabolic Panel: Recent Labs  Lab 08/05/20 0218 08/06/20 0246 08/07/20 0314  NA 138 137 136  K 4.1 3.9 4.2  CL 103 105 104  CO2 25 23 24   GLUCOSE 116* 111* 129*  BUN 20 19 17   CREATININE 1.26* 0.95 0.94  CALCIUM  9.0 8.4* 8.6*  MG  --  1.9 1.9   Liver Function Tests: Recent Labs  Lab 08/05/20 0218 08/06/20 0246  AST 29 44*  ALT 26 49*  ALKPHOS 67 54  BILITOT 1.1 0.8  PROT 6.3* 5.2*  ALBUMIN 3.5 2.8*   No results for input(s): LIPASE, AMYLASE in the last 168 hours. No results for input(s): AMMONIA in the last 168 hours. CBC: Recent Labs  Lab 08/05/20 0218 08/06/20 0246 08/07/20 0314  WBC 9.5 4.6 4.0  NEUTROABS 9.0*  --  3.1  HGB 14.9 13.4 14.1  HCT 46.4 41.5 43.4  MCV 91.7 90.0 88.8  PLT 131* 105* 113*   Cardiac Enzymes: No results for  input(s): CKTOTAL, CKMB, CKMBINDEX, TROPONINI in the last 168 hours. BNP: Invalid input(s): POCBNP CBG: No results for input(s): GLUCAP in the last 168 hours. D-Dimer No results for input(s): DDIMER in the last 72 hours. Hgb A1c No results for input(s): HGBA1C in the last 72 hours. Lipid Profile No results for input(s): CHOL, HDL, LDLCALC, TRIG, CHOLHDL, LDLDIRECT in the last 72 hours. Thyroid function studies No results for input(s): TSH, T4TOTAL, T3FREE, THYROIDAB in the last 72 hours.  Invalid input(s): FREET3 Anemia work up No results for input(s): VITAMINB12, FOLATE, FERRITIN, TIBC, IRON, RETICCTPCT in the last 72 hours. Urinalysis    Component Value Date/Time   COLORURINE YELLOW 08/05/2020 0218   APPEARANCEUR CLOUDY (A) 08/05/2020 0218   LABSPEC 1.020 08/05/2020 0218   PHURINE 6.0 08/05/2020 0218   GLUCOSEU NEGATIVE 08/05/2020 0218   HGBUR LARGE (A) 08/05/2020 0218   BILIRUBINUR NEGATIVE 08/05/2020 0218   KETONESUR NEGATIVE 08/05/2020 0218   PROTEINUR 100 (A) 08/05/2020 0218   NITRITE POSITIVE (A) 08/05/2020 0218   LEUKOCYTESUR TRACE (A) 08/05/2020 0218   Sepsis Labs Invalid input(s): PROCALCITONIN,  WBC,  LACTICIDVEN Microbiology Recent Results (from the past 240 hour(s))  Blood culture (routine single)     Status: Abnormal   Collection Time: 08/05/20  2:39 AM   Specimen: BLOOD  Result Value Ref Range Status    Specimen Description BLOOD SITE NOT SPECIFIED  Final   Special Requests   Final    BOTTLES DRAWN AEROBIC AND ANAEROBIC Blood Culture results may not be optimal due to an inadequate volume of blood received in culture bottles   Culture  Setup Time   Final    GRAM NEGATIVE RODS IN BOTH AEROBIC AND ANAEROBIC BOTTLES Organism ID to follow CRITICAL RESULT CALLED TO, READ BACK BY AND VERIFIED WITH: B,MANCHERIL PHARMD @1721  08/05/20 EB Performed at [facility], [place] [address] [place],  [address]    Culture ESCHERICHIA COLI (A)  Final   Report Status 08/07/2020 FINAL  Final   Organism ID, Bacteria ESCHERICHIA COLI  Final      Susceptibility   Escherichia coli - MIC*    AMPICILLIN >=32 RESISTANT Resistant     CEFAZOLIN <=4 SENSITIVE Sensitive     CEFEPIME <=0.12 SENSITIVE Sensitive     CEFTAZIDIME <=1 SENSITIVE Sensitive     CEFTRIAXONE <=0.25 SENSITIVE Sensitive     CIPROFLOXACIN >=4 RESISTANT Resistant     GENTAMICIN >=16 RESISTANT Resistant     IMIPENEM <=0.25 SENSITIVE Sensitive     TRIMETH/SULFA >=320 RESISTANT Resistant     AMPICILLIN/SULBACTAM 4 SENSITIVE Sensitive     PIP/TAZO <=4 SENSITIVE Sensitive     * ESCHERICHIA COLI  Blood Culture ID Panel (Reflexed)     Status: Abnormal   Collection Time: 08/05/20  2:39 AM  Result Value Ref Range Status   Enterococcus faecalis NOT DETECTED NOT DETECTED Final   Enterococcus Faecium NOT DETECTED NOT DETECTED Final   Listeria monocytogenes NOT DETECTED NOT DETECTED Final   Staphylococcus species NOT DETECTED NOT DETECTED Final   Staphylococcus aureus (BCID) NOT DETECTED NOT DETECTED Final   Staphylococcus epidermidis NOT DETECTED NOT DETECTED Final   Staphylococcus lugdunensis NOT DETECTED NOT DETECTED Final   Streptococcus species NOT DETECTED NOT DETECTED Final   Streptococcus agalactiae NOT DETECTED NOT DETECTED Final   Streptococcus pneumoniae NOT DETECTED NOT DETECTED Final   Streptococcus pyogenes NOT DETECTED NOT  DETECTED Final   A.calcoaceticus-baumannii NOT DETECTED NOT DETECTED Final   Bacteroides fragilis NOT DETECTED NOT  DETECTED Final   Enterobacterales DETECTED (A) NOT DETECTED Final    Comment: Enterobacterales represent a large order of gram negative bacteria, not a single organism. RESULT CALLED TO, READ BACK BY AND VERIFIED WITH: B,MANCHERIL @1721  08/05/20 EB    Enterobacter cloacae complex NOT DETECTED NOT DETECTED Final   Escherichia coli DETECTED (A) NOT DETECTED Final    Comment: RESULT CALLED TO, READ BACK BY AND VERIFIED WITH: B,MANCHERIL @1721  08/05/20 EB    Klebsiella aerogenes NOT DETECTED NOT DETECTED Final   Klebsiella oxytoca NOT DETECTED NOT DETECTED Final   Klebsiella pneumoniae NOT DETECTED NOT DETECTED Final   Proteus species NOT DETECTED NOT DETECTED Final   Salmonella species NOT DETECTED NOT DETECTED Final   Serratia marcescens NOT DETECTED NOT DETECTED Final   Haemophilus influenzae NOT DETECTED NOT DETECTED Final   Neisseria meningitidis NOT DETECTED NOT DETECTED Final   Pseudomonas aeruginosa NOT DETECTED NOT DETECTED Final   Stenotrophomonas maltophilia NOT DETECTED NOT DETECTED Final   Candida albicans NOT DETECTED NOT DETECTED Final   Candida auris NOT DETECTED NOT DETECTED Final   Candida glabrata NOT DETECTED NOT DETECTED Final   Candida krusei NOT DETECTED NOT DETECTED Final   Candida parapsilosis NOT DETECTED NOT DETECTED Final   Candida tropicalis NOT DETECTED NOT DETECTED Final   Cryptococcus neoformans/gattii NOT DETECTED NOT DETECTED Final   CTX-M ESBL NOT DETECTED NOT DETECTED Final   Carbapenem resistance IMP NOT DETECTED NOT DETECTED Final   Carbapenem resistance KPC NOT DETECTED NOT DETECTED Final   Carbapenem resistance NDM NOT DETECTED NOT DETECTED Final   Carbapenem resist OXA 48 LIKE NOT DETECTED NOT DETECTED Final   Carbapenem resistance VIM NOT DETECTED NOT DETECTED Final    Comment: Performed at [facility], [address]  [place], [place] [address]  Urine culture     Status: Abnormal   Collection Time: 08/05/20  2:40 AM   Specimen: In/Out Cath Urine  Result Value Ref Range Status   Specimen Description IN/OUT CATH URINE  Final   Special Requests   Final    NONE Performed at [facility], [place] [address], [place], [place] [address]    Culture >=100,000 COLONIES/mL ESCHERICHIA COLI (A)  Final   Report Status 08/06/2020 FINAL  Final   Organism ID, Bacteria ESCHERICHIA COLI (A)  Final      Susceptibility   Escherichia coli - MIC*    AMPICILLIN >=32 RESISTANT Resistant     CEFAZOLIN <=4 SENSITIVE Sensitive     CEFTRIAXONE <=0.25 SENSITIVE Sensitive     CIPROFLOXACIN >=4 RESISTANT Resistant     GENTAMICIN >=16 RESISTANT Resistant     IMIPENEM <=0.25 SENSITIVE Sensitive     NITROFURANTOIN 32 SENSITIVE Sensitive     TRIMETH/SULFA >=320 RESISTANT Resistant     AMPICILLIN/SULBACTAM 8 SENSITIVE Sensitive     PIP/TAZO <=4 SENSITIVE Sensitive     * >=100,000 COLONIES/mL ESCHERICHIA COLI  SARS Coronavirus 2 by RT PCR (hospital order, performed in [place] hospital lab) Nasopharyngeal Nasopharyngeal Swab     Status: None   Collection Time: 08/05/20  4:34 AM   Specimen: Nasopharyngeal Swab  Result Value Ref Range Status   SARS Coronavirus 2 NEGATIVE NEGATIVE Final    Comment: (NOTE) SARS-CoV-2 target nucleic acids are NOT DETECTED.  The SARS-CoV-2 RNA is generally detectable in upper and lower respiratory specimens during the acute phase of infection. The lowest concentration of SARS-CoV-2 viral copies this assay can detect is 250 copies / mL. A  negative result does not preclude SARS-CoV-2 infection and should not be used as the sole basis for treatment or other patient management decisions.  A negative result may occur with improper specimen collection / handling, submission of specimen other than nasopharyngeal swab, presence of viral mutation(s) within the areas targeted by this assay, and inadequate  number of viral copies (<250 copies / mL). A negative result must be combined with clinical observations, patient history, and epidemiological information.  Fact Sheet for Patients:   StrictlyIdeas.no  Fact Sheet for Healthcare Providers: BankingDealers.co.za  This test is not yet approved or  cleared by the [place] FDA and has been authorized for detection and/or diagnosis of SARS-CoV-2 by FDA under an Emergency Use Authorization (EUA).  This EUA will remain in effect (meaning this test can be used) for the duration of the COVID-19 declaration under Section 564(b)(1) of the Act, 21 U.S.C. section 360bbb-3(b)(1), unless the authorization is terminated or revoked sooner.  Performed at [facility], [place] [address] [place], [place] [address]      Time coordinating discharge: 35 minutes  SIGNED:   Aline August, MD  [facility] 08/07/2020, 9:51 AM

## 2020-08-09 DIAGNOSIS — I4891 Unspecified atrial fibrillation: Secondary | ICD-10-CM | POA: Diagnosis not present

## 2020-08-09 DIAGNOSIS — R55 Syncope and collapse: Secondary | ICD-10-CM | POA: Diagnosis not present

## 2020-08-09 DIAGNOSIS — R58 Hemorrhage, not elsewhere classified: Secondary | ICD-10-CM | POA: Diagnosis not present

## 2020-08-09 DIAGNOSIS — I499 Cardiac arrhythmia, unspecified: Secondary | ICD-10-CM | POA: Diagnosis not present

## 2020-08-09 DIAGNOSIS — R52 Pain, unspecified: Secondary | ICD-10-CM | POA: Diagnosis not present

## 2020-08-10 ENCOUNTER — Inpatient Hospital Stay (HOSPITAL_COMMUNITY)
Admission: EM | Admit: 2020-08-10 | Discharge: 2020-08-15 | DRG: 368 | Disposition: A | Discharge status: Home / Self Care | Payer: Medicare HMO | Attending: Family Medicine | Admitting: Family Medicine

## 2020-08-10 ENCOUNTER — Other Ambulatory Visit: Payer: Self-pay

## 2020-08-10 ENCOUNTER — Encounter (HOSPITAL_COMMUNITY): Payer: Self-pay | Admitting: *Deleted

## 2020-08-10 DIAGNOSIS — R7989 Other specified abnormal findings of blood chemistry: Secondary | ICD-10-CM

## 2020-08-10 DIAGNOSIS — Z85820 Personal history of malignant melanoma of skin: Secondary | ICD-10-CM | POA: Diagnosis not present

## 2020-08-10 DIAGNOSIS — K274 Chronic or unspecified peptic ulcer, site unspecified, with hemorrhage: Secondary | ICD-10-CM | POA: Diagnosis not present

## 2020-08-10 DIAGNOSIS — N7689 Other specified inflammation of vagina and vulva: Secondary | ICD-10-CM | POA: Diagnosis not present

## 2020-08-10 DIAGNOSIS — Z20822 Contact with and (suspected) exposure to covid-19: Secondary | ICD-10-CM | POA: Diagnosis not present

## 2020-08-10 DIAGNOSIS — N179 Acute kidney failure, unspecified: Secondary | ICD-10-CM | POA: Diagnosis not present

## 2020-08-10 DIAGNOSIS — K222 Esophageal obstruction: Secondary | ICD-10-CM | POA: Diagnosis not present

## 2020-08-10 DIAGNOSIS — N401 Enlarged prostate with lower urinary tract symptoms: Secondary | ICD-10-CM | POA: Diagnosis present

## 2020-08-10 DIAGNOSIS — A4151 Sepsis due to Escherichia coli [E. coli]: Secondary | ICD-10-CM | POA: Diagnosis not present

## 2020-08-10 DIAGNOSIS — Z79899 Other long term (current) drug therapy: Secondary | ICD-10-CM | POA: Diagnosis not present

## 2020-08-10 DIAGNOSIS — D62 Acute posthemorrhagic anemia: Secondary | ICD-10-CM | POA: Diagnosis not present

## 2020-08-10 DIAGNOSIS — K226 Gastro-esophageal laceration-hemorrhage syndrome: Secondary | ICD-10-CM | POA: Diagnosis not present

## 2020-08-10 DIAGNOSIS — I4891 Unspecified atrial fibrillation: Secondary | ICD-10-CM | POA: Diagnosis not present

## 2020-08-10 DIAGNOSIS — B9620 Unspecified Escherichia coli [E. coli] as the cause of diseases classified elsewhere: Secondary | ICD-10-CM | POA: Diagnosis present

## 2020-08-10 DIAGNOSIS — E785 Hyperlipidemia, unspecified: Secondary | ICD-10-CM | POA: Diagnosis present

## 2020-08-10 DIAGNOSIS — I480 Paroxysmal atrial fibrillation: Secondary | ICD-10-CM | POA: Diagnosis not present

## 2020-08-10 DIAGNOSIS — R7881 Bacteremia: Secondary | ICD-10-CM | POA: Diagnosis not present

## 2020-08-10 DIAGNOSIS — R3914 Feeling of incomplete bladder emptying: Secondary | ICD-10-CM | POA: Diagnosis not present

## 2020-08-10 DIAGNOSIS — N200 Calculus of kidney: Secondary | ICD-10-CM | POA: Diagnosis not present

## 2020-08-10 DIAGNOSIS — K922 Gastrointestinal hemorrhage, unspecified: Secondary | ICD-10-CM | POA: Diagnosis present

## 2020-08-10 DIAGNOSIS — I10 Essential (primary) hypertension: Secondary | ICD-10-CM | POA: Diagnosis not present

## 2020-08-10 DIAGNOSIS — K920 Hematemesis: Secondary | ICD-10-CM | POA: Diagnosis not present

## 2020-08-10 DIAGNOSIS — K219 Gastro-esophageal reflux disease without esophagitis: Secondary | ICD-10-CM | POA: Diagnosis present

## 2020-08-10 DIAGNOSIS — R069 Unspecified abnormalities of breathing: Secondary | ICD-10-CM

## 2020-08-10 DIAGNOSIS — I959 Hypotension, unspecified: Secondary | ICD-10-CM | POA: Diagnosis not present

## 2020-08-10 DIAGNOSIS — D649 Anemia, unspecified: Secondary | ICD-10-CM

## 2020-08-10 DIAGNOSIS — R571 Hypovolemic shock: Secondary | ICD-10-CM | POA: Diagnosis present

## 2020-08-10 DIAGNOSIS — J80 Acute respiratory distress syndrome: Secondary | ICD-10-CM | POA: Diagnosis not present

## 2020-08-10 DIAGNOSIS — Z96652 Presence of left artificial knee joint: Secondary | ICD-10-CM | POA: Diagnosis present

## 2020-08-10 DIAGNOSIS — K269 Duodenal ulcer, unspecified as acute or chronic, without hemorrhage or perforation: Secondary | ICD-10-CM | POA: Diagnosis not present

## 2020-08-10 DIAGNOSIS — D72829 Elevated white blood cell count, unspecified: Secondary | ICD-10-CM | POA: Diagnosis present

## 2020-08-10 DIAGNOSIS — N320 Bladder-neck obstruction: Secondary | ICD-10-CM | POA: Diagnosis not present

## 2020-08-10 DIAGNOSIS — R578 Other shock: Secondary | ICD-10-CM | POA: Diagnosis present

## 2020-08-10 DIAGNOSIS — R911 Solitary pulmonary nodule: Secondary | ICD-10-CM | POA: Diagnosis not present

## 2020-08-10 DIAGNOSIS — K449 Diaphragmatic hernia without obstruction or gangrene: Secondary | ICD-10-CM | POA: Diagnosis present

## 2020-08-10 DIAGNOSIS — K264 Chronic or unspecified duodenal ulcer with hemorrhage: Secondary | ICD-10-CM | POA: Diagnosis not present

## 2020-08-10 DIAGNOSIS — R55 Syncope and collapse: Secondary | ICD-10-CM

## 2020-08-10 DIAGNOSIS — I70203 Unspecified atherosclerosis of native arteries of extremities, bilateral legs: Secondary | ICD-10-CM | POA: Diagnosis not present

## 2020-08-10 DIAGNOSIS — K921 Melena: Secondary | ICD-10-CM | POA: Diagnosis not present

## 2020-08-10 DIAGNOSIS — N281 Cyst of kidney, acquired: Secondary | ICD-10-CM | POA: Diagnosis not present

## 2020-08-10 DIAGNOSIS — N210 Calculus in bladder: Secondary | ICD-10-CM | POA: Diagnosis not present

## 2020-08-10 LAB — BASIC METABOLIC PANEL
Anion gap: 12 (ref 5–15)
Anion gap: 6 (ref 5–15)
BUN: 67 mg/dL — ABNORMAL HIGH (ref 8–23)
BUN: 61 mg/dL — ABNORMAL HIGH (ref 8–23)
CO2: 22 mmol/L (ref 22–32)
CO2: 21 mmol/L — ABNORMAL LOW (ref 22–32)
Calcium: 7.8 mg/dL — ABNORMAL LOW (ref 8.9–10.3)
Calcium: 7.6 mg/dL — ABNORMAL LOW (ref 8.9–10.3)
Chloride: 106 mmol/L (ref 98–111)
Chloride: 113 mmol/L — ABNORMAL HIGH (ref 98–111)
Creatinine, Ser: 1.11 mg/dL (ref 0.61–1.24)
Creatinine, Ser: 0.87 mg/dL (ref 0.61–1.24)
GFR calc Af Amer: >60 mL/min (ref >60)
GFR calc Af Amer: >60 mL/min (ref >60)
GFR calc non Af Amer: >60 mL/min (ref >60)
GFR calc non Af Amer: >60 mL/min (ref >60)
Glucose, Bld: 147 mg/dL — ABNORMAL HIGH (ref 70–99)
Glucose, Bld: 135 mg/dL — ABNORMAL HIGH (ref 70–99)
Potassium: 4.1 mmol/L (ref 3.5–5.1)
Potassium: 4.6 mmol/L (ref 3.5–5.1)
Sodium: 140 mmol/L (ref 135–145)
Sodium: 140 mmol/L (ref 135–145)

## 2020-08-10 LAB — CBC
HCT: 27.4 % — ABNORMAL LOW (ref 39.0–52.0)
Hemoglobin: 9 g/dL — ABNORMAL LOW (ref 13.0–17.0)
MCH: 29.8 pg (ref 26.0–34.0)
MCHC: 32.8 g/dL (ref 30.0–36.0)
MCV: 90.7 fL (ref 80.0–100.0)
Platelets: 171 10*3/uL (ref 150–400)
RBC: 3.02 MIL/uL — ABNORMAL LOW (ref 4.22–5.81)
RDW: 14.4 % (ref 11.5–15.5)
WBC: 11.9 10*3/uL — ABNORMAL HIGH (ref 4.0–10.5)
nRBC: 0 % (ref 0.0–0.2)

## 2020-08-10 LAB — SARS CORONAVIRUS 2 BY RT PCR (HOSPITAL ORDER, PERFORMED IN ~~LOC~~ HOSPITAL LAB): SARS Coronavirus 2: NEGATIVE

## 2020-08-10 LAB — PREPARE RBC (CROSSMATCH)

## 2020-08-10 LAB — CBC WITH DIFFERENTIAL/PLATELET
Abs Immature Granulocytes: 0.06 10*3/uL (ref 0.00–0.07)
Basophils Absolute: 0 10*3/uL (ref 0.0–0.1)
Basophils Relative: 0 %
Eosinophils Absolute: 0 10*3/uL (ref 0.0–0.5)
Eosinophils Relative: 0 %
HCT: 28.1 % — ABNORMAL LOW (ref 39.0–52.0)
Hemoglobin: 9 g/dL — ABNORMAL LOW (ref 13.0–17.0)
Immature Granulocytes: 1 %
Lymphocytes Relative: 5 %
Lymphs Abs: 0.6 10*3/uL — ABNORMAL LOW (ref 0.7–4.0)
MCH: 29.1 pg (ref 26.0–34.0)
MCHC: 32 g/dL (ref 30.0–36.0)
MCV: 90.9 fL (ref 80.0–100.0)
Monocytes Absolute: 0.7 10*3/uL (ref 0.1–1.0)
Monocytes Relative: 7 %
Neutro Abs: 9.9 10*3/uL — ABNORMAL HIGH (ref 1.7–7.7)
Neutrophils Relative %: 87 %
Platelets: 171 10*3/uL (ref 150–400)
RBC: 3.09 MIL/uL — ABNORMAL LOW (ref 4.22–5.81)
RDW: 13.2 % (ref 11.5–15.5)
WBC: 11.3 10*3/uL — ABNORMAL HIGH (ref 4.0–10.5)
nRBC: 0 % (ref 0.0–0.2)

## 2020-08-10 LAB — HEMOGLOBIN AND HEMATOCRIT, BLOOD
HCT: 19.9 % — ABNORMAL LOW (ref 39.0–52.0)
Hemoglobin: 6.3 g/dL — CL (ref 13.0–17.0)

## 2020-08-10 LAB — LACTIC ACID, PLASMA: Lactic Acid, Venous: 2.1 mmol/L (ref 0.5–1.9)

## 2020-08-10 LAB — POC OCCULT BLOOD, ED: Fecal Occult Bld: NEGATIVE

## 2020-08-10 LAB — ABO/RH: ABO/RH(D): O POS

## 2020-08-10 MED ORDER — SODIUM CHLORIDE 0.9 % IV BOLUS
500.0000 mL | Freq: Once | INTRAVENOUS | Status: AC
  Administered 2020-08-10: 500 mL via INTRAVENOUS

## 2020-08-10 MED ORDER — SODIUM CHLORIDE 0.9 % IV SOLN
80.0000 mg | Freq: Once | INTRAVENOUS | Status: AC
  Administered 2020-08-10: 80 mg via INTRAVENOUS
  Filled 2020-08-10: qty 80

## 2020-08-10 MED ORDER — MIDAZOLAM HCL 2 MG/2ML IJ SOLN
INTRAMUSCULAR | Status: AC | PRN
  Administered 2020-08-10: 1 mg via INTRAVENOUS

## 2020-08-10 MED ORDER — PANTOPRAZOLE SODIUM 40 MG IV SOLR: 40.0000 mg | Freq: Two times a day (BID) | INTRAVENOUS | Status: DC

## 2020-08-10 MED ORDER — SUCRALFATE 1 GM/10ML PO SUSP
1.0000 g | Freq: Three times a day (TID) | ORAL | Status: DC
  Administered 2020-08-10 – 2020-08-14 (×16): 1 g via ORAL
  Filled 2020-08-10 (×20): qty 10

## 2020-08-10 MED ORDER — AMIODARONE HCL IN DEXTROSE 360-4.14 MG/200ML-% IV SOLN
30.0000 mg/h | INTRAVENOUS | Status: DC
  Administered 2020-08-10 – 2020-08-12 (×4): 30 mg/h via INTRAVENOUS
  Filled 2020-08-10 (×5): qty 200

## 2020-08-10 MED ORDER — MIDAZOLAM HCL 2 MG/2ML IJ SOLN
2.0000 mg | Freq: Once | INTRAMUSCULAR | Status: AC
  Administered 2020-08-10: 2 mg via INTRAVENOUS
  Filled 2020-08-10: qty 2

## 2020-08-10 MED ORDER — ONDANSETRON HCL 4 MG/2ML IJ SOLN
4.0000 mg | Freq: Four times a day (QID) | INTRAMUSCULAR | Status: DC | PRN
  Administered 2020-08-10 – 2020-08-11 (×4): 4 mg via INTRAVENOUS
  Filled 2020-08-10 (×5): qty 2

## 2020-08-10 MED ORDER — SODIUM CHLORIDE 0.9 % IV SOLN: 10.0000 mL/h | Freq: Once | INTRAVENOUS | Status: DC

## 2020-08-10 MED ORDER — SODIUM CHLORIDE 0.9 % IV SOLN
8.0000 mg/h | INTRAVENOUS | Status: DC
  Administered 2020-08-10 – 2020-08-12 (×5): 8 mg/h via INTRAVENOUS
  Filled 2020-08-10 (×6): qty 80

## 2020-08-10 MED ORDER — SODIUM CHLORIDE 0.9 % IV SOLN
2.0000 g | Freq: Every day | INTRAVENOUS | Status: AC
  Administered 2020-08-10 – 2020-08-13 (×4): 2 g via INTRAVENOUS
  Filled 2020-08-10 (×2): qty 20
  Filled 2020-08-10: qty 2
  Filled 2020-08-10: qty 20

## 2020-08-10 MED ORDER — AMIODARONE HCL IN DEXTROSE 360-4.14 MG/200ML-% IV SOLN
60.0000 mg/h | INTRAVENOUS | Status: AC
  Administered 2020-08-10: 60 mg/h via INTRAVENOUS

## 2020-08-10 MED ORDER — SODIUM CHLORIDE 0.9 % IV SOLN: INTRAVENOUS | Status: AC

## 2020-08-10 MED ORDER — LACTATED RINGERS IV BOLUS: 1000.0000 mL | Freq: Once | INTRAVENOUS | Status: DC

## 2020-08-10 MED ORDER — SODIUM CHLORIDE 0.9% IV SOLUTION
Freq: Once | INTRAVENOUS | Status: AC
  Administered 2020-08-10: 10 mL/h via INTRAVENOUS

## 2020-08-10 MED ORDER — AMIODARONE LOAD VIA INFUSION
150.0000 mg | Freq: Once | INTRAVENOUS | Status: AC
  Administered 2020-08-10: 150 mg via INTRAVENOUS
  Filled 2020-08-10: qty 83.34

## 2020-08-10 MED ORDER — ONDANSETRON HCL 4 MG/2ML IJ SOLN
4.0000 mg | Freq: Once | INTRAMUSCULAR | Status: AC
  Administered 2020-08-10: 4 mg via INTRAVENOUS
  Filled 2020-08-10: qty 2

## 2020-08-10 MED ORDER — PROPOFOL 10 MG/ML IV BOLUS: 0.5000 mg/kg | Freq: Once | INTRAVENOUS | Status: DC

## 2020-08-10 MED ORDER — ONDANSETRON HCL 4 MG PO TABS: 4.0000 mg | ORAL_TABLET | Freq: Four times a day (QID) | ORAL | Status: DC | PRN

## 2020-08-10 NOTE — ED Notes (Signed)
David Vargas, ICU PA notified of low hg, order for new unit of PRBC gotten and blood started as ordered.

## 2020-08-10 NOTE — Progress Notes (Signed)
Cardiology Consultation:   Patient ID: David Vargas MRN: [id number]; DOB: Mar 12, 1951  Admit date: 08/10/2020 Date of Consult: 08/10/2020  Primary Care Provider: Hulan Fess, MD [facility] Cardiologist: Fransico Him, MD  [place] Electrophysiologist:  None    Patient Profile:   David Vargas is a 69 y.o. male with a hx of paroxysmal afib once with a kidney stone started on aspirin, HTN, HLD, mildly dilated aortic root, normal EF by Echo 2015 with trivial TR, cardiac cath in 2005 with no significant CAD who is being seen today for the evaluation of Afib RVR at the request of Dr. Starla Link.  History of Present Illness:   Mr. David Vargas is followed by Dr. Radford Pax for the above cardiac issues. He has a history of paroxsymal afib that occurred only once during during a kidney stone. CHADSVASC score was 2 for age and HTN and he was started on aspirin. Since the patient did not have reoccurrence aspirin was stopped in January 2020. Chart review notes a cath in 2005 for abnormal nuclear study and echo that showed no significant CAD (cath report not in epic). Last echo in 2015 showed LVEF 60-65% with trivial TR and normal aortic root. He was last seen 01/16/20 by Dr. Radford Pax and symptomatically stable. Recently he was found to have elevated calcium score and started on crestor and was scheduled for a Lexiscan myoview. Patient is retired and relatively active. No tobacco/drug/alcohol use.   The patient presented to the ED 08/10/20 for abdominal pain and throwing up blood. He recently was discharged from the hospital after E.Coli bacteremia following a prostate biopsy. Yesterday he was feeling generally poor. He stood up to walk and had a syncopal episodes. Wife and son placed him on the bed and called the MD who recommended fluids. Later he has another pre-syncopal episode and started having black coffee-ground emesis. Reported associated dizziness and weakness. No chest pain or palpitations. No dark or  bright red stools. EMS was called who noted elevated heart rate with low blood pressure and he was brought to the ED.   In the ED BP initially 86/60, pulse 141, afebrile, RR 18, 99% O2. He was found to be in Afib RVR. Cardioversion x 3 was attempted however this was unsuccessful . Hgb came back showing a 5g drop from 14-9. BUN increased. Stool was hemoccult negative. Patient was given IVF, IV protonix,  and transfused 2 units PRBC's. Other labs showed K+ 4.1, glucose 147, creatinine 1.11, BUN 67, WBC 11.3. lactic acid 2.1 . The patient was admitted for further work-up.   During my interview BP low with SBPs in the 80s and was just started on IV amiodarone and 500cc IVF ordered. Still in afib with rates up to 140s. Denied chest pain or palpitations.    Past Medical History:  Diagnosis Date  . Arrhythmia    paroxysmal afib  . Arthritis   . Basal cell carcinoma    followed by dermatology  . Cancer ([place])    melanoma - left shoulder  . Complication of anesthesia    slow to wake up  . GERD (gastroesophageal reflux disease)   . H/O colonoscopy    2009, polypoid colorectal mucosa found, Dr. Oletta Lamas advised repeat in 3 years  . Hiatal hernia    upper GI in 2003  . History of kidney stones   . History of nuclear stress test 2005   abnormal, cardiac cath done   . Hypertension     Past Surgical History:  Procedure Laterality Date  . BIOPSY PROSTATE  08/03/2020  . CARDIAC CATHETERIZATION  2005   no evidence of significamt atherosclerosis  . EXTRACORPOREAL SHOCK WAVE LITHOTRIPSY Right 02/17/2020   Procedure: EXTRACORPOREAL SHOCK WAVE LITHOTRIPSY (ESWL);  Surgeon: Irine Seal, MD;  Location[facility];  Service: Urology;  Laterality: Right;  . HERNIA REPAIR     right groin  . KNEE SURGERY     left 1984, right 2010  Arthroscopic  . SHOULDER SURGERY Right    rotator cuff repair  . TOTAL KNEE ARTHROPLASTY Left 11/28/2017   Procedure: TOTAL KNEE ARTHROPLASTY;  Surgeon: Renette Butters, MD;  Location: [place];  Service: Orthopedics;  Laterality: Left;     Home Medications:  Prior to Admission medications   Medication Sig Start Date End Date Taking? Authorizing Provider  acetaminophen (TYLENOL) 325 MG tablet Take 650 mg by mouth every 6 (six) hours as needed for mild pain or fever.   Yes [provider]  atorvastatin (LIPITOR) 20 MG tablet Take 1 tablet (20 mg total) by mouth at bedtime. 08/07/20  Yes Aline August, MD  cephALEXin (KEFLEX) 500 MG capsule Take 1 capsule (500 mg total) by mouth 4 (four) times daily for 10 days. 08/07/20 08/17/20 Yes Aline August, MD  famotidine (PEPCID) 20 MG tablet Take 20 mg by mouth daily.   Yes [provider]  Melatonin 5 MG TABS Take 5 mg by mouth at bedtime as needed (for sleep).    Yes [provider]  tadalafil (CIALIS) 10 MG tablet Take 5 mg by mouth daily as needed for erectile dysfunction.    Yes [provider]  tamsulosin (FLOMAX) 0.4 MG CAPS capsule Take 1 capsule (0.4 mg total) by mouth daily after supper. 08/07/20  Yes Aline August, MD    Inpatient Medications: Scheduled Meds: . amiodarone  150 mg Intravenous Once  . [START ON 08/13/2020] pantoprazole  40 mg Intravenous Q12H   Continuous Infusions: . sodium chloride    . sodium chloride 125 mL/hr at 08/10/20 0830  . amiodarone     Followed by  . amiodarone    . cefTRIAXone (ROCEPHIN)  IV 2 g (08/10/20 0832)  . lactated ringers    . lactated ringers    . pantoprozole (PROTONIX) infusion 8 mg/hr (08/10/20 0406)   PRN Meds: ondansetron **OR** ondansetron (ZOFRAN) IV  Allergies:   No Known Allergies  Social History:   Social History   Socioeconomic History  . Marital status: Married    Spouse name: Not on file  . Number of children: Not on file  . Years of education: Not on file  . Highest education level: Not on file  Occupational History  . Not on file  Tobacco Use  . Smoking status: Never Smoker  . Smokeless  tobacco: Never Used  Vaping Use  . Vaping Use: Never used  Substance and Sexual Activity  . Alcohol use: Yes    Comment: few times a week  . Drug use: No  . Sexual activity: Not on file  Other Topics Concern  . Not on file  Social History Narrative  . Not on file   Social Determinants of Health   Financial Resource Strain:   . Difficulty of Paying Living Expenses:   Food Insecurity:   . Worried About Charity fundraiser in the Last Year:   . Arboriculturist in the Last Year:   Transportation Needs:   . Film/video editor (Medical):   Marland Kitchen  Lack of Transportation (Non-Medical):   Physical Activity:   . Days of Exercise per Week:   . Minutes of Exercise per Session:   Stress:   . Feeling of Stress :   Social Connections:   . Frequency of Communication with Friends and Family:   . Frequency of Social Gatherings with Friends and Family:   . Attends Religious Services:   . Active Member of Clubs or Organizations:   . Attends Archivist Meetings:   Marland Kitchen Marital Status:   Intimate Partner Violence:   . Fear of Current or Ex-Partner:   . Emotionally Abused:   Marland Kitchen Physically Abused:   . Sexually Abused:     Family History:   Family History  Problem Relation Age of Onset  . Heart attack Mother   . Liver cancer Father   . Multiple sclerosis Sister      ROS:  Please see the history of present illness.  All other ROS reviewed and negative.     Physical Exam/Data:   Vitals:   08/10/20 0702 08/10/20 0715 08/10/20 0722 08/10/20 0723  BP: 100/65 102/61  (!) 100/59  Pulse: (!) 114 (!) 53  (!) 124  Resp: (!) 0 (!) 9  (!) 0  Temp:   98.6 F (37 C)   TempSrc:      SpO2: 96% 97%  96%  Weight:      Height:        Intake/Output Summary (Last 24 hours) at 08/10/2020 0953 Last data filed at 08/10/2020 0547 Gross per 24 hour  Intake 2000 ml  Output --  Net 2000 ml   Last 3 Weights 08/10/2020 08/05/2020 02/17/2020  Weight (lbs) 218 lb 0.6 oz 218 lb 220 lb 14.4 oz   Weight (kg) 98.9 kg 98.884 kg 100.2 kg     Body mass index is 26.54 kg/m.  General:  Well nourished, well developed, in no acute distress HEENT: normal Lymph: no adenopathy Neck: no JVD Endocrine:  No thryomegaly Vascular: No carotid bruits; FA pulses 2+ bilaterally without bruits  Cardiac:  normal S1, S2; Irreg Irreg; no murmur  Lungs:  clear to auscultation bilaterally, no wheezing, rhonchi or rales  Abd: soft, nontender, no hepatomegaly  Ext: no edema Musculoskeletal:  No deformities, BUE and BLE strength normal and equal Skin: warm and dry  Neuro:  CNs 2-12 intact, no focal abnormalities noted Psych:  Normal affect   EKG:  The EKG was personally reviewed and demonstrates:  Afib RVR, 163bpm Telemetry:  Telemetry was personally reviewed and demonstrates:  Afib HR 140s, up to 170s.   Relevant CV Studies:  Echo 2015 Study Conclusions   - Left ventricle: The cavity size was normal. There was moderate  concentric hypertrophy. Systolic function was normal. The  estimated ejection fraction was in the range of 60% to 65%. Wall  motion was normal; there were no regional wall motion  abnormalities. Left ventricular diastolic function parameters  were normal.  - Tricuspid valve: There was trivial regurgitation.  - Pulmonary arteries: Systolic pressure was within the normal  range.    Laboratory Data:  High Sensitivity Troponin:  No results for input(s): TROPONINIHS in the last 720 hours.   Chemistry Recent Labs  Lab 08/06/20 0246 08/07/20 0314 08/10/20 0039  NA 137 136 140  K 3.9 4.2 4.1  CL 105 104 106  CO2 23 24 22   GLUCOSE 111* 129* 147*  BUN 19 17 67*  CREATININE 0.95 0.94 1.11  CALCIUM 8.4* 8.6* 7.8*  GFRNONAA >60 >60 >60  GFRAA >60 >60 >60  ANIONGAP 9 8 12     Recent Labs  Lab 08/05/20 0218 08/06/20 0246  PROT 6.3* 5.2*  ALBUMIN 3.5 2.8*  AST 29 44*  ALT 26 49*  ALKPHOS 67 54  BILITOT 1.1 0.8   Hematology Recent Labs  Lab  08/06/20 0246 08/07/20 0314 08/10/20 0039  WBC 4.6 4.0 11.3*  RBC 4.61 4.89 3.09*  HGB 13.4 14.1 9.0*  HCT 41.5 43.4 28.1*  MCV 90.0 88.8 90.9  MCH 29.1 28.8 29.1  MCHC 32.3 32.5 32.0  RDW 13.9 13.6 13.2  PLT 105* 113* 171   BNPNo results for input(s): BNP, PROBNP in the last 168 hours.  DDimer No results for input(s): DDIMER in the last 168 hours.   Radiology/Studies:  No results found.   Assessment and Plan:   Afib RVR - History of paroxsymal afib in the setting of another acute illness. At that time CHADSVASC 2 ( age, HTN?) and he was started on aspirin which las later stopped - In the ED afib RVR with rates in the 160s.  - Bps were low and pr underwent unsuccsseful cardioversion x 3 in the ED - No IV dilt for soft pressures.  - Echo in 2015 showed preserved EF, can re-check an echo - Pressure low with SBPs in the 80s started on Amiodarone. Agree with this - Afib RVR likely exacerbated by acute anemia and should improve as acute issue resolves   Acute anemia/UGIB - Hgb 9 from 14 a couple days ago - reported coffee ground emesis, has h/o of NSAID use - GI planning EGD tomorrow  HTN - pta not on antihypertensives - pressures soft. Receiving IVF   HLD - atorvastatin 20 mg daily  For questions or updates, please contact [place] Please consult [email or web address] for contact info under    Signed, Ewing Fandino Ninfa Meeker, PA-C  08/10/2020 9:53 AM

## 2020-08-10 NOTE — ED Triage Notes (Signed)
The pt arrived by [facility] from home abd pain vomiting since  This am   He was diagnosed with e coli   Thursday   When ems arrived  The pt was cool with clammy af hx   bp at  The house was 74/42  They gave him a liter of nss bp 96/56 afterward.  Pt vomiting dark liquid at home

## 2020-08-10 NOTE — ED Provider Notes (Signed)
[facility] EMERGENCY DEPARTMENT Provider Note   CSN: [id number] Arrival date & time: 08/10/20  0018   History Chief Complaint  Patient presents with  . Abdominal Pain    David Vargas is a 69 y.o. male.  The history is provided by the patient.  Abdominal Pain He has history of hypertension, hyperlipidemia, paroxysmal atrial fibrillation not on anticoagulation and comes in because of weakness and dizziness and syncopal episode.  He was discharged from [facility] 2 days ago after an episode of E. coli bacteremia following prostate biopsy.  He had been doing well, but has felt weak throughout the day today.  He had a syncopal episode after using the bathroom.  Tonight, he had nausea and vomited some black material.  He has not noticed any melena.  He was not aware of his heart racing, but, when his blood pressure was checked, his heart rate was noted to be high.  EMS noted blood pressure in the 70s and transported here.  He denies chest pain, heaviness, tightness, pressure.  Denies dyspnea.  He has completed COVID-19 vaccinations.  Past Medical History:  Diagnosis Date  . Arrhythmia    paroxysmal afib  . Arthritis   . Basal cell carcinoma    followed by dermatology  . Cancer ([place])    melanoma - left shoulder  . Complication of anesthesia    slow to wake up  . GERD (gastroesophageal reflux disease)   . H/O colonoscopy    2009, polypoid colorectal mucosa found, Dr. Oletta Lamas advised repeat in 3 years  . Hiatal hernia    upper GI in 2003  . History of kidney stones   . History of nuclear stress test 2005   abnormal, cardiac cath done   . Hypertension     Patient Active Problem List   Diagnosis Date Noted  . E coli bacteremia 08/06/2020  . Fever and chills 08/05/2020  . Primary osteoarthritis of left knee 11/13/2017  . Hyperlipidemia 11/06/2017  . Preoperative clearance 11/06/2017  . PAF (paroxysmal atrial fibrillation) ([place]) 01/28/2014  .  Essential hypertension, benign 01/28/2014    Past Surgical History:  Procedure Laterality Date  . BIOPSY PROSTATE  08/03/2020  . CARDIAC CATHETERIZATION  2005   no evidence of significamt atherosclerosis  . EXTRACORPOREAL SHOCK WAVE LITHOTRIPSY Right 02/17/2020   Procedure: EXTRACORPOREAL SHOCK WAVE LITHOTRIPSY (ESWL);  Surgeon: Irine Seal, MD;  Location: [facility];  Service: Urology;  Laterality: Right;  . HERNIA REPAIR     right groin  . KNEE SURGERY     left 1984, right 2010  Arthroscopic  . SHOULDER SURGERY Right    rotator cuff repair  . TOTAL KNEE ARTHROPLASTY Left 11/28/2017   Procedure: TOTAL KNEE ARTHROPLASTY;  Surgeon: Renette Butters, MD;  Location: [facility];  Service: Orthopedics;  Laterality: Left;       Family History  Problem Relation Age of Onset  . Heart attack Mother   . Liver cancer Father   . Multiple sclerosis Sister     Social History   Tobacco Use  . Smoking status: Never Smoker  . Smokeless tobacco: Never Used  Vaping Use  . Vaping Use: Never used  Substance Use Topics  . Alcohol use: Yes    Comment: few times a week  . Drug use: No    Home Medications Prior to Admission medications   Medication Sig Start Date End Date Taking? Authorizing Provider  acetaminophen (TYLENOL) 325 MG tablet Take  650 mg by mouth every 6 (six) hours as needed for mild pain or fever.    [provider]  atorvastatin (LIPITOR) 20 MG tablet Take 1 tablet (20 mg total) by mouth at bedtime. 08/07/20   Aline August, MD  cephALEXin (KEFLEX) 500 MG capsule Take 1 capsule (500 mg total) by mouth 4 (four) times daily for 10 days. 08/07/20 08/17/20  Aline August, MD  Melatonin 5 MG TABS Take 5 mg by mouth at bedtime as needed (for sleep).     [provider]  tadalafil (CIALIS) 10 MG tablet Take 5 mg by mouth daily as needed for erectile dysfunction.     [provider]  tamsulosin (FLOMAX) 0.4 MG CAPS capsule Take 1 capsule (0.4 mg  total) by mouth daily after supper. 08/07/20   Aline August, MD    Allergies    Patient has no known allergies.  Review of Systems   Review of Systems  Gastrointestinal: Positive for abdominal pain.  All other systems reviewed and are negative.   Physical Exam Updated Vital Signs BP (!) 86/60   Pulse (!) 141   Temp 98.5 F (36.9 C) (Oral)   Resp 18   Ht 6\' 4"  (1.93 m)   Wt 98.9 kg   SpO2 99%   BMI 26.54 kg/m   Physical Exam Vitals and nursing note reviewed.   69 year old male, resting comfortably and in no acute distress. Vital signs are significant for low blood pressure and rapid heart rate. Oxygen saturation is 99%, which is normal. Head is normocephalic and atraumatic. PERRLA, EOMI. Oropharynx is clear. Neck is nontender and supple without adenopathy or JVD. Back is nontender and there is no CVA tenderness. Lungs are clear without rales, wheezes, or rhonchi. Chest is nontender. Heart is tachycardic and irregular without murmur. Abdomen is soft, flat, nontender without masses or hepatosplenomegaly and peristalsis is normoactive. Rectal: Normal sphincter tone.  Small amount of light brown stool present.  Induration noted at site of prostate biopsies. Extremities have no cyanosis or edema, full range of motion is present. Skin is warm and dry without rash. Neurologic: Mental status is normal, cranial nerves are intact, there are no motor or sensory deficits.  ED Results / Procedures / Treatments   Labs (all labs ordered are listed, but only abnormal results are displayed) Labs Reviewed  CBC WITH DIFFERENTIAL/PLATELET  BASIC METABOLIC PANEL  LACTIC ACID, PLASMA  LACTIC ACID, PLASMA  POC OCCULT BLOOD, ED  TYPE AND SCREEN    EKG EKG Interpretation  Date/Time:  Monday August 10 2020 00:27:43 EDT Ventricular Rate:  163 PR Interval:    QRS Duration: 80 QT Interval:  296 QTC Calculation: 465 R Axis:   -27 Text Interpretation: Atrial fibrillation with rapid  V-rate Ventricular premature complex Borderline left axis deviation Abnormal R-wave progression, early transition When compared with ECG of 08/05/2020, Atrial fibrillation with rapid ventricular response has replaced Sinus rhythm Confirmed by Delora Fuel ([id number]) on 08/10/2020 12:32:33 AM   Radiology No results found.  Procedures .Sedation  Date/Time: 08/10/2020 2:29 AM Performed by: Delora Fuel, MD Authorized by: Delora Fuel, MD   Consent:    Consent obtained:  Verbal   Consent given by:  Patient   Risks discussed:  Prolonged hypoxia resulting in organ damage, prolonged sedation necessitating reversal, inadequate sedation and respiratory compromise necessitating ventilatory assistance and intubation Indications:    Procedure performed:  Cardioversion   Procedure necessitating sedation performed by:  Physician performing sedation Pre-sedation assessment:  Time since last food or drink:  6 hours   ASA classification: class 2 - patient with mild systemic disease     Neck mobility: normal     Mouth opening:  3 or more finger widths   Thyromental distance:  4 finger widths   Mallampati score:  I - soft palate, uvula, fauces, pillars visible   Pre-sedation assessments completed and reviewed: airway patency, cardiovascular function, hydration status, mental status, pain level, respiratory function and temperature     Pre-sedation assessments completed and reviewed: pre-procedure nausea and vomiting status not reviewed     Pre-sedation assessment completed:  08/10/2020 1:45 AM Immediate pre-procedure details:    Reassessment: Patient reassessed immediately prior to procedure     Reviewed: vital signs, relevant labs/tests and NPO status     Verified: bag valve mask available, emergency equipment available, intubation equipment available, IV patency confirmed, oxygen available, reversal medications available and suction available   Procedure details (see MAR for exact dosages):     Preoxygenation:  Nasal cannula   Sedation:  Midazolam   Intended level of sedation: moderate (conscious sedation)   Intra-procedure monitoring:  Blood pressure monitoring, continuous capnometry, frequent LOC assessments, cardiac monitor, continuous pulse oximetry and frequent vital sign checks   Intra-procedure events: none     Total Provider sedation time (minutes):  30 Post-procedure details:    Post-sedation assessment completed:  08/10/2020 2:15 AM   Attendance: Constant attendance by certified staff until patient recovered     Recovery: Patient returned to pre-procedure baseline     Post-sedation assessments completed and reviewed: airway patency, cardiovascular function, hydration status, mental status, pain level, respiratory function and temperature     Post-sedation assessments completed and reviewed: nausea/vomiting not reviewed     Patient is stable for discharge or admission: yes     Patient tolerance:  Tolerated well, no immediate complications .Cardioversion  Date/Time: 08/10/2020 1:50 AM Performed by: Delora Fuel, MD Authorized by: Delora Fuel, MD   Consent:    Consent obtained:  Verbal   Consent given by:  Patient   Risks discussed:  Cutaneous burn, death, induced arrhythmia and pain   Alternatives discussed:  Alternative treatment Pre-procedure details:    Cardioversion basis:  Emergent   Rhythm:  Atrial fibrillation   Electrode placement:  Anterior-posterior Patient sedated: Yes. Refer to sedation procedure documentation for details of sedation.  Attempt one:    Cardioversion mode:  Synchronous   Waveform:  Biphasic   Shock (Joules):  150   Shock outcome:  No change in rhythm Attempt two:    Cardioversion mode:  Synchronous   Waveform:  Biphasic   Shock (Joules):  200   Shock outcome:  No change in rhythm Attempt three:    Cardioversion mode:  Asynchronous   Waveform:  Biphasic   Shock (Joules):  200   Shock outcome:  No change in rhythm Post-procedure  details:    Patient status:  Alert   Patient tolerance of procedure:  Tolerated well, no immediate complications    CRITICAL CARE Performed by: Delora Fuel Total critical care time: 120 minutes Critical care time was exclusive of separately billable procedures and treating other patients. Critical care was necessary to treat or prevent imminent or life-threatening deterioration. Critical care was time spent personally by me on the following activities: development of treatment plan with patient and/or surrogate as well as nursing, discussions with consultants, evaluation of patient's response to treatment, examination of patient, obtaining history from patient or surrogate, ordering  and performing treatments and interventions, ordering and review of laboratory studies, ordering and review of radiographic studies, pulse oximetry and re-evaluation of patient's condition.  Medications Ordered in ED Medications  lactated ringers bolus 1,000 mL (has no administration in time range)  ondansetron (ZOFRAN) injection 4 mg (has no administration in time range)  midazolam (VERSED) injection 2 mg (has no administration in time range)    ED Course  I have reviewed the triage vital signs and the nursing notes.  Pertinent labs & imaging results that were available during my care of the patient were reviewed by me and considered in my medical decision making (see chart for details).  MDM Rules/Calculators/A&P Hypotension.  Tachycardia which on monitor appears to be atrial fibrillation.  ECG confirms atrial fibrillation with rapid ventricular response.  Hypotension may be partly related to atrial fibrillation, possibly due to blood loss.  It is unclear if his black emesis actually was hematemesis.  Stool shows no gross blood.  Although he is not anticoagulated, review of old records shows normal heart rate on day of discharge 2 days ago, so I feel it is safe to cardiovert him in the emergency department.  We  will hold off on anticoagulation until it is clear whether or not he is having a GI bleed.  Because of hypotension, or not use propofol for sedation, will use midazolam.  Unfortunately, attempts at cardioversion were unsuccessful.  He remained in atrial fibrillation with rapid ventricular response in spite of cardioversion x3.  Case has been discussed with Dr. Kalman Shan, on-call for cardiology who feels that rapid heart rate and low blood pressure probably related to volume and not atrial fibrillation.  Hemoglobin has come back showing a 5 g drop compared with 8/13.  BUN is significantly increased, consistent with upper GI bleed.  Stool was Hemoccult negative, but that just means it but had not progressed through the GI tract.  Patient was given additional fluids and blood transfusion.  Case is discussed with Dr. Hal Hope of [facility], who agrees to admit the patient.  CHA2DS2-VASc Score = 2 (one point each for age and history of hypertension).   Final Clinical Impression(s) / ED Diagnoses Final diagnoses:  Upper gastrointestinal bleeding  Paroxysmal atrial fibrillation with rapid ventricular response (HCC)  Prerenal azotemia  Syncope and collapse    Rx / DC Orders ED Discharge Orders    None       Delora Fuel, MD 37/48/27 [phone]

## 2020-08-10 NOTE — Progress Notes (Signed)
Patient ID: ARLEIGH ODOWD, male   DOB: 1951/04/10, 69 y.o.   MRN: [id number] Patient admitted early this morning for hematemesis and was found to have 5 g drop of hemoglobin from 14-9 and is receiving packed red cells transfusion. Patient seen and examined at bedside and plan of care discussed with him. I have reviewed patient's  medical records including this morning's H&P, vitals, labs, medications myself. Currently tachycardic in A. fib with RVR with failed attempts at cardioversion in the ED. Cardiology has been consulted, await recommendations. PCCM has been consulted who recommend to continue resuscitation along with GI and cardiology evaluation. I have consulted GI as well. Follow recommendations. Continue IV Protonix. Repeat a.m. labs. Monitor H&H.

## 2020-08-10 NOTE — ED Notes (Signed)
Lab orders for 0500 have to wait until the blood transfusion is complete  Only first unit has been started

## 2020-08-10 NOTE — Progress Notes (Signed)
PCCM INTERVAL PROGRESS NOTE   Called by RN regarding hemoglobin 6.3. Transfused 2 units earlier in the day for suspected GIB. Will give one additional unit PRBC and repeat labs in the AM.      Georgann Housekeeper, AGACNP-BC [place] for personal pager PCCM on call pager [phone]  08/10/2020 9:36 PM

## 2020-08-10 NOTE — ED Notes (Signed)
Pt's HR on the 130- 140's with a low SBP on 80, pt and wife continues to insist on getting up to urinate, pt states he is not able to urinate lying down. Family and pt encouraged to keep on bed at this time due to his Vitals signs not bee stable at this time and there is a risk he can have a LOC standing up. Family and pt getting upset about the situation and continue to insist to stand up. Pt assisted to sit on the side of the bed and was able to urinate.

## 2020-08-10 NOTE — Progress Notes (Signed)
I was asked to see this patient again in the emergency department because of hypotension and tachycardia. 69 year old male who was brought into the emergency department with hematemesis and he was noted to be in atrial fibrillation with rapid ventricular response.  Patient continued to remain hypotensive with systolic in 93Z and tachycardic into 130s.  Assessment and plan: Acute upper GI bleeding Atrial fibrillation with rapid ventricular response Acute hemorrhagic shock  Continue IV Protonix GI consult is appreciated, recommend doing EGD tomorrow morning Monitor H&H Transfuse if hemoglobin less than 7 Closely monitor blood pressure, will start vasopressors if MAP less than 60.   Admit to ICU  Critical care time spent 25 minutes

## 2020-08-10 NOTE — ED Notes (Addendum)
Rounding given on pt due to a drop on his BP 70/50. BP recheck and increased to 86/61. Family requesting to have the Zoll pads removed from his chest since Cardiology told them it can be removed, family and pt oriented that because his vitals are not stable it is better to keep those on for safety in case we need more monitoring, they continue to be upset about the situation. Wife states Cardiologist told us we don't have to worry about his HR now".

## 2020-08-10 NOTE — ED Notes (Signed)
The pt has converted to a nsr

## 2020-08-10 NOTE — H&P (Addendum)
History and Physical    David Vargas NOB:[id number] DOB: Dec 08, 1951 DOA: 08/10/2020  PCP: Hulan Fess, MD  Patient coming from: Home.  Chief Complaint: Throwing up blood.  HPI: David Vargas is a 69 y.o. male with history of paroxysmal atrial fibrillation not on anticoagulation recently admitted for E. coli bacteremia after prostate biopsy discharged 3 days ago was brought to the ER after patient had at least 4 episodes of coffee-ground vomitus at home.  Patient states earlier in the day felt weak and dizzy he almost passed out and was lying on the bed.  Later he had some soup and feels lately started throwing up black/coffee-ground vomitus.  Denies noticing any black stools.  Denies taking any NSAIDs.  Patient is not on any anticoagulation or antiplatelet agents.  ED Course: In the ER patient was hypotensive tachycardic A. fib with RVR.  Patient was attempted cardioversion 3 times but failed.  Cardiology at this time feels patient's heart rate is driven by the hypovolemia and anemia and advised to give fluids and blood transfusion.  Patient's hemoglobin has dropped a 5 g from 14-9.  Patient is receiving 2 units of PRBC transfusion after fluid bolus and Protonix infusion has been started.  Covid test is still pending.  Review of Systems: As per HPI, rest all negative.   Past Medical History:  Diagnosis Date  . Arrhythmia    paroxysmal afib  . Arthritis   . Basal cell carcinoma    followed by dermatology  . Cancer ([place])    melanoma - left shoulder  . Complication of anesthesia    slow to wake up  . GERD (gastroesophageal reflux disease)   . H/O colonoscopy    2009, polypoid colorectal mucosa found, Dr. Oletta Lamas advised repeat in 3 years  . Hiatal hernia    upper GI in 2003  . History of kidney stones   . History of nuclear stress test 2005   abnormal, cardiac cath done   . Hypertension     Past Surgical History:  Procedure Laterality Date  . BIOPSY PROSTATE  08/03/2020   . CARDIAC CATHETERIZATION  2005   no evidence of significamt atherosclerosis  . EXTRACORPOREAL SHOCK WAVE LITHOTRIPSY Right 02/17/2020   Procedure: EXTRACORPOREAL SHOCK WAVE LITHOTRIPSY (ESWL);  Surgeon: Irine Seal, MD;  Location: [facility];  Service: Urology;  Laterality: Right;  . HERNIA REPAIR     right groin  . KNEE SURGERY     left 1984, right 2010  Arthroscopic  . SHOULDER SURGERY Right    rotator cuff repair  . TOTAL KNEE ARTHROPLASTY Left 11/28/2017   Procedure: TOTAL KNEE ARTHROPLASTY;  Surgeon: Renette Butters, MD;  Location: [facility];  Service: Orthopedics;  Laterality: Left;     reports that he has never smoked. He has never used smokeless tobacco. He reports current alcohol use. He reports that he does not use drugs.  No Known Allergies  Family History  Problem Relation Age of Onset  . Heart attack Mother   . Liver cancer Father   . Multiple sclerosis Sister     Prior to Admission medications   Medication Sig Start Date End Date Taking? Authorizing Provider  acetaminophen (TYLENOL) 325 MG tablet Take 650 mg by mouth every 6 (six) hours as needed for mild pain or fever.   Yes [provider]  atorvastatin (LIPITOR) 20 MG tablet Take 1 tablet (20 mg total) by mouth at bedtime. 08/07/20  Yes Aline August, MD  cephALEXin (KEFLEX) 500 MG capsule Take 1 capsule (500 mg total) by mouth 4 (four) times daily for 10 days. 08/07/20 08/17/20 Yes Aline August, MD  famotidine (PEPCID) 20 MG tablet Take 20 mg by mouth daily.   Yes [provider]  Melatonin 5 MG TABS Take 5 mg by mouth at bedtime as needed (for sleep).    Yes [provider]  tadalafil (CIALIS) 10 MG tablet Take 5 mg by mouth daily as needed for erectile dysfunction.    Yes [provider]  tamsulosin (FLOMAX) 0.4 MG CAPS capsule Take 1 capsule (0.4 mg total) by mouth daily after supper. 08/07/20  Yes Aline August, MD    Physical Exam: Constitutional:  Moderately built and nourished. Vitals:   08/10/20 0215 08/10/20 0318 08/10/20 0345 08/10/20 0353  BP: (!) 82/56 (!) 82/65 95/72 90/60   Pulse: (!) 124 (!) 144 (!) 108 (!) 131  Resp: 17 18 (!) 0 16  Temp:  98.6 F (37 C)  98.8 F (37.1 C)  TempSrc:      SpO2: 96% 99% 97% 97%  Weight:      Height:       Eyes: Anicteric no pallor. ENMT: No discharge from the ears eyes nose or mouth. Neck: No mass felt.  No neck rigidity. Respiratory: No rhonchi or crepitations. Cardiovascular: S1-S2 heard. Abdomen: Soft nontender bowel sounds present. Musculoskeletal: No edema. Skin: No rash. Neurologic: Alert awake oriented to time place and person.  Moves all extremities. Psychiatric: Appears normal.  Normal affect.   Labs on Admission: I have personally reviewed following labs and imaging studies  CBC: Recent Labs  Lab 08/05/20 0218 08/06/20 0246 08/07/20 0314 08/10/20 0039  WBC 9.5 4.6 4.0 11.3*  NEUTROABS 9.0*  --  3.1 9.9*  HGB 14.9 13.4 14.1 9.0*  HCT 46.4 41.5 43.4 28.1*  MCV 91.7 90.0 88.8 90.9  PLT 131* 105* 113* 568   Basic Metabolic Panel: Recent Labs  Lab 08/05/20 0218 08/06/20 0246 08/07/20 0314 08/10/20 0039  NA 138 137 136 140  K 4.1 3.9 4.2 4.1  CL 103 105 104 106  CO2 25 23 24 22   GLUCOSE 116* 111* 129* 147*  BUN 20 19 17  67*  CREATININE 1.26* 0.95 0.94 1.11  CALCIUM 9.0 8.4* 8.6* 7.8*  MG  --  1.9 1.9  --    GFR: Estimated Creatinine Clearance: 76 mL/min (by C-G formula based on SCr of 1.11 mg/dL). Liver Function Tests: Recent Labs  Lab 08/05/20 0218 08/06/20 0246  AST 29 44*  ALT 26 49*  ALKPHOS 67 54  BILITOT 1.1 0.8  PROT 6.3* 5.2*  ALBUMIN 3.5 2.8*   No results for input(s): LIPASE, AMYLASE in the last 168 hours. No results for input(s): AMMONIA in the last 168 hours. Coagulation Profile: Recent Labs  Lab 08/05/20 0218 08/06/20 0246  INR 1.2 1.3*   Cardiac Enzymes: No results for input(s): CKTOTAL, CKMB, CKMBINDEX, TROPONINI in the  last 168 hours. BNP (last 3 results) No results for input(s): PROBNP in the last 8760 hours. HbA1C: No results for input(s): HGBA1C in the last 72 hours. CBG: No results for input(s): GLUCAP in the last 168 hours. Lipid Profile: No results for input(s): CHOL, HDL, LDLCALC, TRIG, CHOLHDL, LDLDIRECT in the last 72 hours. Thyroid Function Tests: No results for input(s): TSH, T4TOTAL, FREET4, T3FREE, THYROIDAB in the last 72 hours. Anemia Panel: No results for input(s): VITAMINB12, FOLATE, FERRITIN, TIBC, IRON, RETICCTPCT in the last 72 hours. Urine analysis:  Component Value Date/Time   COLORURINE YELLOW 08/05/2020 0218   APPEARANCEUR CLOUDY (A) 08/05/2020 0218   LABSPEC 1.020 08/05/2020 0218   PHURINE 6.0 08/05/2020 0218   GLUCOSEU NEGATIVE 08/05/2020 0218   HGBUR LARGE (A) 08/05/2020 0218   BILIRUBINUR NEGATIVE 08/05/2020 0218   KETONESUR NEGATIVE 08/05/2020 0218   PROTEINUR 100 (A) 08/05/2020 0218   NITRITE POSITIVE (A) 08/05/2020 0218   LEUKOCYTESUR TRACE (A) 08/05/2020 0218   Sepsis Labs: @LABRCNTIP (procalcitonin:4,lacticidven:4) ) Recent Results (from the past 240 hour(s))  Blood culture (routine single)     Status: Abnormal   Collection Time: 08/05/20  2:39 AM   Specimen: BLOOD  Result Value Ref Range Status   Specimen Description BLOOD SITE NOT SPECIFIED  Final   Special Requests   Final    BOTTLES DRAWN AEROBIC AND ANAEROBIC Blood Culture results may not be optimal due to an inadequate volume of blood received in culture bottles   Culture  Setup Time   Final    GRAM NEGATIVE RODS IN BOTH AEROBIC AND ANAEROBIC BOTTLES Organism ID to follow CRITICAL RESULT CALLED TO, READ BACK BY AND VERIFIED WITH: B,MANCHERIL PHARMD @1721  08/05/20 EB Performed at [facility], [place] [address], [place], [place] [address]    Culture ESCHERICHIA COLI (A)  Final   Report Status 08/07/2020 FINAL  Final   Organism ID, Bacteria ESCHERICHIA COLI  Final      Susceptibility    Escherichia coli - MIC*    AMPICILLIN >=32 RESISTANT Resistant     CEFAZOLIN <=4 SENSITIVE Sensitive     CEFEPIME <=0.12 SENSITIVE Sensitive     CEFTAZIDIME <=1 SENSITIVE Sensitive     CEFTRIAXONE <=0.25 SENSITIVE Sensitive     CIPROFLOXACIN >=4 RESISTANT Resistant     GENTAMICIN >=16 RESISTANT Resistant     IMIPENEM <=0.25 SENSITIVE Sensitive     TRIMETH/SULFA >=320 RESISTANT Resistant     AMPICILLIN/SULBACTAM 4 SENSITIVE Sensitive     PIP/TAZO <=4 SENSITIVE Sensitive     * ESCHERICHIA COLI  Blood Culture ID Panel (Reflexed)     Status: Abnormal   Collection Time: 08/05/20  2:39 AM  Result Value Ref Range Status   Enterococcus faecalis NOT DETECTED NOT DETECTED Final   Enterococcus Faecium NOT DETECTED NOT DETECTED Final   Listeria monocytogenes NOT DETECTED NOT DETECTED Final   Staphylococcus species NOT DETECTED NOT DETECTED Final   Staphylococcus aureus (BCID) NOT DETECTED NOT DETECTED Final   Staphylococcus epidermidis NOT DETECTED NOT DETECTED Final   Staphylococcus lugdunensis NOT DETECTED NOT DETECTED Final   Streptococcus species NOT DETECTED NOT DETECTED Final   Streptococcus agalactiae NOT DETECTED NOT DETECTED Final   Streptococcus pneumoniae NOT DETECTED NOT DETECTED Final   Streptococcus pyogenes NOT DETECTED NOT DETECTED Final   A.calcoaceticus-baumannii NOT DETECTED NOT DETECTED Final   Bacteroides fragilis NOT DETECTED NOT DETECTED Final   Enterobacterales DETECTED (A) NOT DETECTED Final    Comment: Enterobacterales represent a large order of gram negative bacteria, not a single organism. RESULT CALLED TO, READ BACK BY AND VERIFIED WITH: B,MANCHERIL @1721  08/05/20 EB    Enterobacter cloacae complex NOT DETECTED NOT DETECTED Final   Escherichia coli DETECTED (A) NOT DETECTED Final    Comment: RESULT CALLED TO, READ BACK BY AND VERIFIED WITH: B,MANCHERIL @1721  08/05/20 EB    Klebsiella aerogenes NOT DETECTED NOT DETECTED Final   Klebsiella oxytoca NOT DETECTED NOT  DETECTED Final   Klebsiella pneumoniae NOT DETECTED NOT DETECTED Final   Proteus species NOT DETECTED NOT DETECTED  Final   Salmonella species NOT DETECTED NOT DETECTED Final   Serratia marcescens NOT DETECTED NOT DETECTED Final   Haemophilus influenzae NOT DETECTED NOT DETECTED Final   Neisseria meningitidis NOT DETECTED NOT DETECTED Final   Pseudomonas aeruginosa NOT DETECTED NOT DETECTED Final   Stenotrophomonas maltophilia NOT DETECTED NOT DETECTED Final   Candida albicans NOT DETECTED NOT DETECTED Final   Candida auris NOT DETECTED NOT DETECTED Final   Candida glabrata NOT DETECTED NOT DETECTED Final   Candida krusei NOT DETECTED NOT DETECTED Final   Candida parapsilosis NOT DETECTED NOT DETECTED Final   Candida tropicalis NOT DETECTED NOT DETECTED Final   Cryptococcus neoformans/gattii NOT DETECTED NOT DETECTED Final   CTX-M ESBL NOT DETECTED NOT DETECTED Final   Carbapenem resistance IMP NOT DETECTED NOT DETECTED Final   Carbapenem resistance KPC NOT DETECTED NOT DETECTED Final   Carbapenem resistance NDM NOT DETECTED NOT DETECTED Final   Carbapenem resist OXA 48 LIKE NOT DETECTED NOT DETECTED Final   Carbapenem resistance VIM NOT DETECTED NOT DETECTED Final    Comment: Performed at [facility], [address], [place], [place] [address]  Urine culture     Status: Abnormal   Collection Time: 08/05/20  2:40 AM   Specimen: In/Out Cath Urine  Result Value Ref Range Status   Specimen Description IN/OUT CATH URINE  Final   Special Requests   Final    NONE Performed at [facility], [place] [address], [place], [place] [address]    Culture >=100,000 COLONIES/mL ESCHERICHIA COLI (A)  Final   Report Status 08/06/2020 FINAL  Final   Organism ID, Bacteria ESCHERICHIA COLI (A)  Final      Susceptibility   Escherichia coli - MIC*    AMPICILLIN >=32 RESISTANT Resistant     CEFAZOLIN <=4 SENSITIVE Sensitive     CEFTRIAXONE <=0.25 SENSITIVE Sensitive     CIPROFLOXACIN >=4  RESISTANT Resistant     GENTAMICIN >=16 RESISTANT Resistant     IMIPENEM <=0.25 SENSITIVE Sensitive     NITROFURANTOIN 32 SENSITIVE Sensitive     TRIMETH/SULFA >=320 RESISTANT Resistant     AMPICILLIN/SULBACTAM 8 SENSITIVE Sensitive     PIP/TAZO <=4 SENSITIVE Sensitive     * >=100,000 COLONIES/mL ESCHERICHIA COLI  SARS Coronavirus 2 by RT PCR (hospital order, performed in [facility] hospital lab) Nasopharyngeal Nasopharyngeal Swab     Status: None   Collection Time: 08/05/20  4:34 AM   Specimen: Nasopharyngeal Swab  Result Value Ref Range Status   SARS Coronavirus 2 NEGATIVE NEGATIVE Final    Comment: (NOTE) SARS-CoV-2 target nucleic acids are NOT DETECTED.  The SARS-CoV-2 RNA is generally detectable in upper and lower respiratory specimens during the acute phase of infection. The lowest concentration of SARS-CoV-2 viral copies this assay can detect is 250 copies / mL. A negative result does not preclude SARS-CoV-2 infection and should not be used as the sole basis for treatment or other patient management decisions.  A negative result may occur with improper specimen collection / handling, submission of specimen other than nasopharyngeal swab, presence of viral mutation(s) within the areas targeted by this assay, and inadequate number of viral copies (<250 copies / mL). A negative result must be combined with clinical observations, patient history, and epidemiological information.  Fact Sheet for Patients:   StrictlyIdeas.no  Fact Sheet for Healthcare Providers: BankingDealers.co.za  This test is not yet approved or  cleared by the [place] FDA and has been authorized for detection  and/or diagnosis of SARS-CoV-2 by FDA under an Emergency Use Authorization (EUA).  This EUA will remain in effect (meaning this test can be used) for the duration of the COVID-19 declaration under Section 564(b)(1) of the Act, 21 U.S.C. section  360bbb-3(b)(1), unless the authorization is terminated or revoked sooner.  Performed at [facility], [place] [address] [place], [place] [address]      Radiological Exams on Admission: No results found.  EKG: Independently reviewed.  A. fib with RVR.  Assessment/Plan Principal Problem:   Acute GI bleeding Active Problems:   Atrial fibrillation with RVR (HCC)   Acute blood loss anemia    1. Acute GI bleeding -likely upper GI given the patient's coffee-ground vomitus.  We will keep patient n.p.o. and continue with IV Protonix infusion.  Patient is receiving 2 units of PRBC transfusion.  Will consult [facility] since patient was seen by [facility] gastroenterologist and has had colonoscopy in 2018 which showed diverticulosis. 2. A. fib with RVR -discussed with the cardiologist on-call Dr. Kalman Shan who advised to hydrate patient and also transfuse and per cardiology patient's A. fib likely precipitated by hypovolemia.  Cardioversion failed.  At this time they did not recommend any amiodarone to the heart rate was more than 160.  Patient not on anticoagulation previously.  Presently having active GI bleed. 3. Acute blood loss anemia follow CBC. 4. Recent E. coli bacteremia on Keflex.  I have placed patient on ceftriaxone.  Given that patient has acute GI bleed with hypotension A. fib will need close monitoring for any further worsening in inpatient status.   DVT prophylaxis: SCDs.  Avoiding anticoagulation due to GI bleed. Code Status: Full code. Family Communication: Discussed with patient. Disposition Plan: Home. Consults called: Discussed with cardiologist.  I have consulted cardiology and pulmonary critical care.  Will try to reach gastroenterologist. Admission status: Inpatient.   Rise Patience MD [facility] Pager [phone].  If 7PM-7AM, please contact night-coverage [email or web address] Password [email or web address]  08/10/2020, 4:02 AM

## 2020-08-10 NOTE — Consult Note (Signed)
[facility] Consult  Referring Provider: [facility] Primary Care Physician:  Hulan Fess, MD Primary Gastroenterologist: [facility](Dr.Edwards)  Reason for Consultation: Coffee-ground emesis  HPI: David Vargas is a 69 y.o. male with past medical history of paroxysmal atrial fibrillation, not on anticoagulation, recent prostate biopsy on 08/03/2020 with E. coli bacteremia presents to the ED with 6 episodes of coffee-ground emesis. Patient takes cimetidine at home for acid reflux and heartburn but denies associated difficulty swallowing, pain on swallowing, epigastric pain or early satiety. He takes Aleve on a regular basis after playing golf, however denies NSAID use in the last 7 days. Patient drinks beer and alcohol usually after dinner, has not had any alcohol for the last 3 to 4 days.  After being discharged from the hospital a few days ago patient complained of feeling weak, dizzy, almost losing consciousness and multiple episodes of black coffee-ground emesis yesterday evening which prompted him to come to the ER. No prior EGD. Denies noticing black stools or blood in stools. Last colonoscopy was performed in 2018 with removal of a tubular adenoma from descending colon, also noted to have internal hemorrhoids and diverticulosis. Patient reports 60 pounds of intentional weight loss over a period of last 6 to 8 years.  In the ER patient was found to have RVR with A. fib and 3 attempts of cardioversion were unsuccessful.    Past Medical History:  Diagnosis Date  . Arrhythmia    paroxysmal afib  . Arthritis   . Basal cell carcinoma    followed by dermatology  . Cancer ([place])    melanoma - left shoulder  . Complication of anesthesia    slow to wake up  . GERD (gastroesophageal reflux disease)   . H/O colonoscopy    2009, polypoid colorectal mucosa found, Dr. Oletta Lamas advised repeat in 3 years  . Hiatal hernia    upper GI in 2003  . History of kidney  stones   . History of nuclear stress test 2005   abnormal, cardiac cath done   . Hypertension     Past Surgical History:  Procedure Laterality Date  . BIOPSY PROSTATE  08/03/2020  . CARDIAC CATHETERIZATION  2005   no evidence of significamt atherosclerosis  . EXTRACORPOREAL SHOCK WAVE LITHOTRIPSY Right 02/17/2020   Procedure: EXTRACORPOREAL SHOCK WAVE LITHOTRIPSY (ESWL);  Surgeon: Irine Seal, MD;  Location: [facility];  Service: Urology;  Laterality: Right;  . HERNIA REPAIR     right groin  . KNEE SURGERY     left 1984, right 2010  Arthroscopic  . SHOULDER SURGERY Right    rotator cuff repair  . TOTAL KNEE ARTHROPLASTY Left 11/28/2017   Procedure: TOTAL KNEE ARTHROPLASTY;  Surgeon: Renette Butters, MD;  Location: [facility];  Service: Orthopedics;  Laterality: Left;    Prior to Admission medications   Medication Sig Start Date End Date Taking? Authorizing Provider  acetaminophen (TYLENOL) 325 MG tablet Take 650 mg by mouth every 6 (six) hours as needed for mild pain or fever.   Yes [provider]  atorvastatin (LIPITOR) 20 MG tablet Take 1 tablet (20 mg total) by mouth at bedtime. 08/07/20  Yes Aline August, MD  cephALEXin (KEFLEX) 500 MG capsule Take 1 capsule (500 mg total) by mouth 4 (four) times daily for 10 days. 08/07/20 08/17/20 Yes Aline August, MD  famotidine (PEPCID) 20 MG tablet Take 20 mg by mouth daily.   Yes [provider]  Melatonin 5 MG TABS Take 5  mg by mouth at bedtime as needed (for sleep).    Yes [provider]  tadalafil (CIALIS) 10 MG tablet Take 5 mg by mouth daily as needed for erectile dysfunction.    Yes [provider]  tamsulosin (FLOMAX) 0.4 MG CAPS capsule Take 1 capsule (0.4 mg total) by mouth daily after supper. 08/07/20  Yes Aline August, MD    Current Facility-Administered Medications  Medication Dose Route Frequency Provider Last Rate Last Admin  . 0.9 %  sodium chloride infusion  10 mL/hr  Intravenous Once Rise Patience, MD      . 0.9 %  sodium chloride infusion   Intravenous Continuous Rise Patience, MD      . cefTRIAXone (ROCEPHIN) 2 g in sodium chloride 0.9 % 100 mL IVPB  2 g Intravenous Daily Rise Patience, MD   Held at 08/10/20 [phone]  . lactated ringers bolus 1,000 mL  1,000 mL Intravenous Once Rise Patience, MD      . lactated ringers bolus 1,000 mL  1,000 mL Intravenous Once Rise Patience, MD      . ondansetron [facility]) tablet 4 mg  4 mg Oral Q6H PRN Rise Patience, MD       Or  . ondansetron [facility]) injection 4 mg  4 mg Intravenous Q6H PRN Rise Patience, MD      . pantoprazole (PROTONIX) 80 mg in sodium chloride 0.9 % 100 mL (0.8 mg/mL) infusion  8 mg/hr Intravenous Continuous Rise Patience, MD 10 mL/hr at 08/10/20 0406 8 mg/hr at 08/10/20 0406  . [START ON 08/13/2020] pantoprazole (PROTONIX) injection 40 mg  40 mg Intravenous Q12H Rise Patience, MD       Current Outpatient Medications  Medication Sig Dispense Refill  . acetaminophen (TYLENOL) 325 MG tablet Take 650 mg by mouth every 6 (six) hours as needed for mild pain or fever.    Marland Kitchen atorvastatin (LIPITOR) 20 MG tablet Take 1 tablet (20 mg total) by mouth at bedtime.    . cephALEXin (KEFLEX) 500 MG capsule Take 1 capsule (500 mg total) by mouth 4 (four) times daily for 10 days. 40 capsule 0  . famotidine (PEPCID) 20 MG tablet Take 20 mg by mouth daily.    . Melatonin 5 MG TABS Take 5 mg by mouth at bedtime as needed (for sleep).     . tadalafil (CIALIS) 10 MG tablet Take 5 mg by mouth daily as needed for erectile dysfunction.     . tamsulosin (FLOMAX) 0.4 MG CAPS capsule Take 1 capsule (0.4 mg total) by mouth daily after supper.      Allergies as of 08/10/2020  . (No Known Allergies)    Family History  Problem Relation Age of Onset  . Heart attack Mother   . Liver cancer Father   . Multiple sclerosis Sister     Social History   Socioeconomic  History  . Marital status: Married    Spouse name: Not on file  . Number of children: Not on file  . Years of education: Not on file  . Highest education level: Not on file  Occupational History  . Not on file  Tobacco Use  . Smoking status: Never Smoker  . Smokeless tobacco: Never Used  Vaping Use  . Vaping Use: Never used  Substance and Sexual Activity  . Alcohol use: Yes    Comment: few times a week  . Drug use: No  . Sexual activity: Not on file  Other Topics Concern  . Not on file  Social History Narrative  . Not on file   Social Determinants of Health   Financial Resource Strain:   . Difficulty of Paying Living Expenses:   Food Insecurity:   . Worried About Charity fundraiser in the Last Year:   . Arboriculturist in the Last Year:   Transportation Needs:   . Film/video editor (Medical):   Marland Kitchen Lack of Transportation (Non-Medical):   Physical Activity:   . Days of Exercise per Week:   . Minutes of Exercise per Session:   Stress:   . Feeling of Stress :   Social Connections:   . Frequency of Communication with Friends and Family:   . Frequency of Social Gatherings with Friends and Family:   . Attends Religious Services:   . Active Member of Clubs or Organizations:   . Attends Archivist Meetings:   Marland Kitchen Marital Status:   Intimate Partner Violence:   . Fear of Current or Ex-Partner:   . Emotionally Abused:   Marland Kitchen Physically Abused:   . Sexually Abused:     Review of Systems: Positive for: GI: Described in detail in HPI.    Gen: Denies any fever, chills, rigors, night sweats, anorexia, fatigue, weakness, malaise, involuntary weight loss, and sleep disorder CV: palpitations, denies chest pain, angina, syncope, orthopnea, PND, peripheral edema and claudication. Resp: Denies dyspnea, cough, sputum, wheezing, coughing up blood. GU : Recent prostate biopsy, increased frequency, hesitancy, hematuria MS: Left knee replacement Derm: Denies rash, itching,  oral ulcerations, hives, unhealing ulcers.  Psych: Denies depression, anxiety, memory loss, suicidal ideation, hallucinations,  and confusion. Heme: Denies bruising and enlarged lymph nodes. Neuro:  Dizziness, denies any headaches or paresthesias. Endo:  Denies any problems with DM, thyroid, adrenal function.  Physical Exam: Vital signs in last 24 hours: Temp:  [98.4 F (36.9 C)-98.8 F (37.1 C)] 98.6 F (37 C) (08/16 0722) Pulse Rate:  [53-159] 124 (08/16 0723) Resp:  [0-18] 0 (08/16 0723) BP: (76-106)/(56-74) 100/59 (08/16 0723) SpO2:  [91 %-99 %] 96 % (08/16 0723) Weight:  [98.9 kg] 98.9 kg (08/16 0037)    General:   Alert,  Well-developed, well-nourished, pleasant and cooperative in NAD Head:  Normocephalic and atraumatic. Eyes:  Sclera clear, no icterus.   Mild pallor Ears:  Normal auditory acuity. Nose:  No deformity, discharge,  or lesions. Mouth:  No deformity or lesions.  Oropharynx pink & moist. Neck:  Supple; no masses or thyromegaly. Lungs:  Clear throughout to auscultation.   No wheezes, crackles, or rhonchi. No acute distress. Heart: Tachycardia, irregular rhythm Extremities:  Without clubbing or edema. Neurologic:  Alert and  oriented x4;  grossly normal neurologically. Skin:  Intact without significant lesions or rashes. Psych:  Alert and cooperative. Normal mood and affect. Abdomen:  Soft, nontender and nondistended. No masses, hepatosplenomegaly or hernias noted. Normal bowel sounds, without guarding, and without rebound.         Lab Results: Recent Labs    08/10/20 0039  WBC 11.3*  HGB 9.0*  HCT 28.1*  PLT 171   BMET Recent Labs    08/10/20 0039  NA 140  K 4.1  CL 106  CO2 22  GLUCOSE 147*  BUN 67*  CREATININE 1.11  CALCIUM 7.8*   LFT No results for input(s): PROT, ALBUMIN, AST, ALT, ALKPHOS, BILITOT, BILIDIR, IBILI in the last 72 hours. PT/INR No results for input(s): LABPROT, INR in the last 72  hours.  Studies/Results: No results  found.  Impression: Coffee-ground emesis, hemoglobin dropped from 14.1 to 9 on admission, within 3 days Elevated BUN/creatinine ratio of 67/1.11 compatible with upper GI bleed History of NSAID use  Atrial fibrillation with RVR with mild hypotension, 3 attempted cardioversions unsuccessful   Plan: 2 units PRBC transfusion given in ER, IV Protonix infusion started Patient continues to be in A. fib with a heart rate between 124-147 and remains slightly hypotensive at 100/59 mmHg  Plan EGD likely tomorrow, if patient's hemodynamics get more stable  Discussed about the risks and the benefits of endoscopy with the patient in details, he understands and verbalizes consent. From GI standpoint he may be started on clear liquid diet to be kept n.p.o. post midnight unless a procedure is required from a cardiac standpoint.   LOS: 0 days   Ronnette Juniper, MD  08/10/2020, 8:16 AM

## 2020-08-10 NOTE — ED Notes (Signed)
Zoll pads removed per family and pt request.

## 2020-08-10 NOTE — ED Notes (Signed)
Pt alert and oriented skin warm and dry

## 2020-08-10 NOTE — Consult Note (Signed)
NAME:  David Vargas, MRN:  [id number], DOB:  1951-12-03, LOS: 0 ADMISSION DATE:  08/10/2020, CONSULTATION DATE:  08/10/2020 REFERRING MD:  David Vargas, CHIEF COMPLAINT:  Coffee-ground emesis   Brief History   Mr. David Vargas is a 69 yo man with history of paroxysmal atrial fibrillation not on anticoagulation who was brought to the ED due to coffee-ground emesis, found to be hypovolemic and in atrial fibrillation  History of present illness   David Vargas is a 68 y.o. male with history of paroxysmal atrial fibrillation not on anticoagulation recently admitted for E. coli bacteremia after prostate biopsy discharged 3 days ago was brought to the ER after patient had at least 4 episodes of coffee-ground vomitus at home.  Patient states earlier in the day felt weak and dizzy he almost passed out and was lying on the bed.  Later he had some soup and feels lately started throwing up black/coffee-ground vomitus.  Denies noticing any black stools.  Denies taking any NSAIDs.  Patient is not on any anticoagulation or antiplatelet agents.  Past Medical History  Atrial fibrillation  Significant Hospital Events   8/16 Cardioversion attempted  Consults:  Gastroenterology Cardiology  Procedures:  8/16- Cardioversion, not successful  Significant Diagnostic Tests:    Micro Data:    Antimicrobials:    Interim history/subjective:  As above  Objective   Blood pressure 96/68, pulse (!) 140, temperature 98.4 F (36.9 C), resp. rate 18, height 6\' 4"  (1.93 m), weight 98.9 kg, SpO2 96 %.        Intake/Output Summary (Last 24 hours) at 08/10/2020 0650 Last data filed at 08/10/2020 0547 Gross per 24 hour  Intake 2000 ml  Output --  Net 2000 ml   Filed Weights   08/10/20 0037  Weight: 98.9 kg    Examination: General: Awake, alert and cooperative HENT: Mucus membranes moist Lungs: Clear to auscultation bilaterally, without wheezes, moving air well Cardiovascular: RRR, no mrg Abdomen: Soft,  nontender, non-distented Extremities: Lower extremities without edema Neuro: Alert, maintains appropriate conversation  Resolved Hospital Problem list     Assessment & Plan:   Acute GI Bleed: -Maintaining MAPs over 65 without the need of pressors, responding well to fluid resuscitation, no indication for ICU admission at this time -Continue resusitation with PRBC and fluids -Agree with GI consult  Atrial Fibrillation with Rapid Ventricular Response: -Continue volume resusitation -Recommendations per cardiology  Recent E. Coli bacteremia -Continue previously prescribed Keflex  Best practice:    Labs   CBC: Recent Labs  Lab 08/05/20 0218 08/06/20 0246 08/07/20 0314 08/10/20 0039  WBC 9.5 4.6 4.0 11.3*  NEUTROABS 9.0*  --  3.1 9.9*  HGB 14.9 13.4 14.1 9.0*  HCT 46.4 41.5 43.4 28.1*  MCV 91.7 90.0 88.8 90.9  PLT 131* 105* 113* 782    Basic Metabolic Panel: Recent Labs  Lab 08/05/20 0218 08/06/20 0246 08/07/20 0314 08/10/20 0039  NA 138 137 136 140  K 4.1 3.9 4.2 4.1  CL 103 105 104 106  CO2 25 23 24 22   GLUCOSE 116* 111* 129* 147*  BUN 20 19 17  67*  CREATININE 1.26* 0.95 0.94 1.11  CALCIUM 9.0 8.4* 8.6* 7.8*  MG  --  1.9 1.9  --    GFR: Estimated Creatinine Clearance: 76 mL/min (by C-G formula based on SCr of 1.11 mg/dL). Recent Labs  Lab 08/05/20 0218 08/05/20 0404 08/06/20 0246 08/07/20 0314 08/10/20 0039 08/10/20 0200  WBC 9.5  --  4.6 4.0 11.3*  --  LATICACIDVEN 2.0* 1.7  --   --   --  2.1*    Liver Function Tests: Recent Labs  Lab 08/05/20 0218 08/06/20 0246  AST 29 44*  ALT 26 49*  ALKPHOS 67 54  BILITOT 1.1 0.8  PROT 6.3* 5.2*  ALBUMIN 3.5 2.8*   No results for input(s): LIPASE, AMYLASE in the last 168 hours. No results for input(s): AMMONIA in the last 168 hours.  ABG No results found for: PHART, PCO2ART, PO2ART, HCO3, TCO2, ACIDBASEDEF, O2SAT   Coagulation Profile: Recent Labs  Lab 08/05/20 0218 08/06/20 0246  INR 1.2  1.3*    Cardiac Enzymes: No results for input(s): CKTOTAL, CKMB, CKMBINDEX, TROPONINI in the last 168 hours.  HbA1C: No results found for: HGBA1C  CBG: No results for input(s): GLUCAP in the last 168 hours.  Review of Systems:   Negative review of systems except as per HPI  Past Medical History  He,  has a past medical history of Arrhythmia, Arthritis, Basal cell carcinoma, Cancer ([place]), Complication of anesthesia, GERD (gastroesophageal reflux disease), H/O colonoscopy, Hiatal hernia, History of kidney stones, History of nuclear stress test (2005), and Hypertension.   Surgical History    Past Surgical History:  Procedure Laterality Date  . BIOPSY PROSTATE  08/03/2020  . CARDIAC CATHETERIZATION  2005   no evidence of significamt atherosclerosis  . EXTRACORPOREAL SHOCK WAVE LITHOTRIPSY Right 02/17/2020   Procedure: EXTRACORPOREAL SHOCK WAVE LITHOTRIPSY (ESWL);  Surgeon: Irine Seal, MD;  Location: [facility];  Service: Urology;  Laterality: Right;  . HERNIA REPAIR     right groin  . KNEE SURGERY     left 1984, right 2010  Arthroscopic  . SHOULDER SURGERY Right    rotator cuff repair  . TOTAL KNEE ARTHROPLASTY Left 11/28/2017   Procedure: TOTAL KNEE ARTHROPLASTY;  Surgeon: Renette Butters, MD;  Location: [facility];  Service: Orthopedics;  Laterality: Left;     Social History   reports that he has never smoked. He has never used smokeless tobacco. He reports current alcohol use. He reports that he does not use drugs.   Family History   His family history includes Heart attack in his mother; Liver cancer in his father; Multiple sclerosis in his sister.   Allergies No Known Allergies   Home Medications  Prior to Admission medications   Medication Sig Start Date End Date Taking? Authorizing Provider  acetaminophen (TYLENOL) 325 MG tablet Take 650 mg by mouth every 6 (six) hours as needed for mild pain or fever.   Yes [provider]  atorvastatin  (LIPITOR) 20 MG tablet Take 1 tablet (20 mg total) by mouth at bedtime. 08/07/20  Yes Aline August, MD  cephALEXin (KEFLEX) 500 MG capsule Take 1 capsule (500 mg total) by mouth 4 (four) times daily for 10 days. 08/07/20 08/17/20 Yes Aline August, MD  famotidine (PEPCID) 20 MG tablet Take 20 mg by mouth daily.   Yes [provider]  Melatonin 5 MG TABS Take 5 mg by mouth at bedtime as needed (for sleep).    Yes [provider]  tadalafil (CIALIS) 10 MG tablet Take 5 mg by mouth daily as needed for erectile dysfunction.    Yes [provider]  tamsulosin (FLOMAX) 0.4 MG CAPS capsule Take 1 capsule (0.4 mg total) by mouth daily after supper. 08/07/20  Yes Aline August, MD     Critical care time: 35

## 2020-08-10 NOTE — H&P (View-Only) (Signed)
[facility] Consult  Referring Provider: [facility] Primary Care Physician:  Hulan Fess, MD Primary Gastroenterologist: [facility](Dr.Edwards)  Reason for Consultation: Coffee-ground emesis  HPI: David Vargas is a 69 y.o. male with past medical history of paroxysmal atrial fibrillation, not on anticoagulation, recent prostate biopsy on 08/03/2020 with E. coli bacteremia presents to the ED with 6 episodes of coffee-ground emesis. Patient takes cimetidine at home for acid reflux and heartburn but denies associated difficulty swallowing, pain on swallowing, epigastric pain or early satiety. He takes Aleve on a regular basis after playing golf, however denies NSAID use in the last 7 days. Patient drinks beer and alcohol usually after dinner, has not had any alcohol for the last 3 to 4 days.  After being discharged from the hospital a few days ago patient complained of feeling weak, dizzy, almost losing consciousness and multiple episodes of black coffee-ground emesis yesterday evening which prompted him to come to the ER. No prior EGD. Denies noticing black stools or blood in stools. Last colonoscopy was performed in 2018 with removal of a tubular adenoma from descending colon, also noted to have internal hemorrhoids and diverticulosis. Patient reports 60 pounds of intentional weight loss over a period of last 6 to 8 years.  In the ER patient was found to have RVR with A. fib and 3 attempts of cardioversion were unsuccessful.    Past Medical History:  Diagnosis Date   Arrhythmia    paroxysmal afib   Arthritis    Basal cell carcinoma    followed by dermatology   Cancer [facility])    melanoma - left shoulder   Complication of anesthesia    slow to wake up   GERD (gastroesophageal reflux disease)    H/O colonoscopy    2009, polypoid colorectal mucosa found, Dr. Oletta Lamas advised repeat in 3 years   Hiatal hernia    upper GI in 2003   History of kidney  stones    History of nuclear stress test 2005   abnormal, cardiac cath done    Hypertension     Past Surgical History:  Procedure Laterality Date   BIOPSY PROSTATE  08/03/2020   CARDIAC CATHETERIZATION  2005   no evidence of significamt atherosclerosis   EXTRACORPOREAL SHOCK WAVE LITHOTRIPSY Right 02/17/2020   Procedure: EXTRACORPOREAL SHOCK WAVE LITHOTRIPSY (ESWL);  Surgeon: Irine Seal, MD;  Location: [facility];  Service: Urology;  Laterality: Right;   HERNIA REPAIR     right groin   KNEE SURGERY     left 1984, right 2010  Arthroscopic   SHOULDER SURGERY Right    rotator cuff repair   TOTAL KNEE ARTHROPLASTY Left 11/28/2017   Procedure: TOTAL KNEE ARTHROPLASTY;  Surgeon: Renette Butters, MD;  Location: [place] [facility];  Service: Orthopedics;  Laterality: Left;    Prior to Admission medications   Medication Sig Start Date End Date Taking? Authorizing Provider  acetaminophen (TYLENOL) 325 MG tablet Take 650 mg by mouth every 6 (six) hours as needed for mild pain or fever.   Yes [provider]  atorvastatin (LIPITOR) 20 MG tablet Take 1 tablet (20 mg total) by mouth at bedtime. 08/07/20  Yes Aline August, MD  cephALEXin (KEFLEX) 500 MG capsule Take 1 capsule (500 mg total) by mouth 4 (four) times daily for 10 days. 08/07/20 08/17/20 Yes Aline August, MD  famotidine (PEPCID) 20 MG tablet Take 20 mg by mouth daily.   Yes [provider]  Melatonin 5 MG TABS Take 5  mg by mouth at bedtime as needed (for sleep).    Yes [provider]  tadalafil (CIALIS) 10 MG tablet Take 5 mg by mouth daily as needed for erectile dysfunction.    Yes [provider]  tamsulosin (FLOMAX) 0.4 MG CAPS capsule Take 1 capsule (0.4 mg total) by mouth daily after supper. 08/07/20  Yes Aline August, MD    Current Facility-Administered Medications  Medication Dose Route Frequency Provider Last Rate Last Admin   0.9 %  sodium chloride infusion  10 mL/hr  Intravenous Once Rise Patience, MD       0.9 %  sodium chloride infusion   Intravenous Continuous Rise Patience, MD       cefTRIAXone (ROCEPHIN) 2 g in sodium chloride 0.9 % 100 mL IVPB  2 g Intravenous Daily Rise Patience, MD   Held at 08/10/20 5643   lactated ringers bolus 1,000 mL  1,000 mL Intravenous Once Rise Patience, MD       lactated ringers bolus 1,000 mL  1,000 mL Intravenous Once Rise Patience, MD       ondansetron [facility]) tablet 4 mg  4 mg Oral Q6H PRN Rise Patience, MD       Or   ondansetron [facility]) injection 4 mg  4 mg Intravenous Q6H PRN Rise Patience, MD       pantoprazole (PROTONIX) 80 mg in sodium chloride 0.9 % 100 mL (0.8 mg/mL) infusion  8 mg/hr Intravenous Continuous Rise Patience, MD 10 mL/hr at 08/10/20 0406 8 mg/hr at 08/10/20 0406   [START ON 08/13/2020] pantoprazole (PROTONIX) injection 40 mg  40 mg Intravenous Q12H Rise Patience, MD       Current Outpatient Medications  Medication Sig Dispense Refill   acetaminophen (TYLENOL) 325 MG tablet Take 650 mg by mouth every 6 (six) hours as needed for mild pain or fever.     atorvastatin (LIPITOR) 20 MG tablet Take 1 tablet (20 mg total) by mouth at bedtime.     cephALEXin (KEFLEX) 500 MG capsule Take 1 capsule (500 mg total) by mouth 4 (four) times daily for 10 days. 40 capsule 0   famotidine (PEPCID) 20 MG tablet Take 20 mg by mouth daily.     Melatonin 5 MG TABS Take 5 mg by mouth at bedtime as needed (for sleep).      tadalafil (CIALIS) 10 MG tablet Take 5 mg by mouth daily as needed for erectile dysfunction.      tamsulosin (FLOMAX) 0.4 MG CAPS capsule Take 1 capsule (0.4 mg total) by mouth daily after supper.      Allergies as of 08/10/2020   (No Known Allergies)    Family History  Problem Relation Age of Onset   Heart attack Mother    Liver cancer Father    Multiple sclerosis Sister     Social History   Socioeconomic  History   Marital status: Married    Spouse name: Not on file   Number of children: Not on file   Years of education: Not on file   Highest education level: Not on file  Occupational History   Not on file  Tobacco Use   Smoking status: Never Smoker   Smokeless tobacco: Never Used  Vaping Use   Vaping Use: Never used  Substance and Sexual Activity   Alcohol use: Yes    Comment: few times a week   Drug use: No   Sexual activity: Not on file  Other Topics Concern   Not on file  Social History Narrative   Not on file   Social Determinants of Health   Financial Resource Strain:    Difficulty of Paying Living Expenses:   Food Insecurity:    Worried About Charity fundraiser in the Last Year:    Arboriculturist in the Last Year:   Transportation Needs:    Film/video editor (Medical):    Lack of Transportation (Non-Medical):   Physical Activity:    Days of Exercise per Week:    Minutes of Exercise per Session:   Stress:    Feeling of Stress :   Social Connections:    Frequency of Communication with Friends and Family:    Frequency of Social Gatherings with Friends and Family:    Attends Religious Services:    Active Member of Clubs or Organizations:    Attends Music therapist:    Marital Status:   Intimate Partner Violence:    Fear of Current or Ex-Partner:    Emotionally Abused:    Physically Abused:    Sexually Abused:     Review of Systems: Positive for: GI: Described in detail in HPI.    Gen: Denies any fever, chills, rigors, night sweats, anorexia, fatigue, weakness, malaise, involuntary weight loss, and sleep disorder CV: palpitations, denies chest pain, angina, syncope, orthopnea, PND, peripheral edema and claudication. Resp: Denies dyspnea, cough, sputum, wheezing, coughing up blood. GU : Recent prostate biopsy, increased frequency, hesitancy, hematuria MS: Left knee replacement Derm: Denies rash, itching,  oral ulcerations, hives, unhealing ulcers.  Psych: Denies depression, anxiety, memory loss, suicidal ideation, hallucinations,  and confusion. Heme: Denies bruising and enlarged lymph nodes. Neuro:  Dizziness, denies any headaches or paresthesias. Endo:  Denies any problems with DM, thyroid, adrenal function.  Physical Exam: Vital signs in last 24 hours: Temp:  [98.4 F (36.9 C)-98.8 F (37.1 C)] 98.6 F (37 C) (08/16 0722) Pulse Rate:  [53-159] 124 (08/16 0723) Resp:  [0-18] 0 (08/16 0723) BP: (76-106)/(56-74) 100/59 (08/16 0723) SpO2:  [91 %-99 %] 96 % (08/16 0723) Weight:  [98.9 kg] 98.9 kg (08/16 0037)    General:   Alert,  Well-developed, well-nourished, pleasant and cooperative in NAD Head:  Normocephalic and atraumatic. Eyes:  Sclera clear, no icterus.   Mild pallor Ears:  Normal auditory acuity. Nose:  No deformity, discharge,  or lesions. Mouth:  No deformity or lesions.  Oropharynx pink & moist. Neck:  Supple; no masses or thyromegaly. Lungs:  Clear throughout to auscultation.   No wheezes, crackles, or rhonchi. No acute distress. Heart: Tachycardia, irregular rhythm Extremities:  Without clubbing or edema. Neurologic:  Alert and  oriented x4;  grossly normal neurologically. Skin:  Intact without significant lesions or rashes. Psych:  Alert and cooperative. Normal mood and affect. Abdomen:  Soft, nontender and nondistended. No masses, hepatosplenomegaly or hernias noted. Normal bowel sounds, without guarding, and without rebound.         Lab Results: Recent Labs    08/10/20 0039  WBC 11.3*  HGB 9.0*  HCT 28.1*  PLT 171   BMET Recent Labs    08/10/20 0039  NA 140  K 4.1  CL 106  CO2 22  GLUCOSE 147*  BUN 67*  CREATININE 1.11  CALCIUM 7.8*   LFT No results for input(s): PROT, ALBUMIN, AST, ALT, ALKPHOS, BILITOT, BILIDIR, IBILI in the last 72 hours. PT/INR No results for input(s): LABPROT, INR in the last 72  hours.  Studies/Results: No results  found.  Impression: Coffee-ground emesis, hemoglobin dropped from 14.1 to 9 on admission, within 3 days Elevated BUN/creatinine ratio of 67/1.11 compatible with upper GI bleed History of NSAID use  Atrial fibrillation with RVR with mild hypotension, 3 attempted cardioversions unsuccessful   Plan: 2 units PRBC transfusion given in ER, IV Protonix infusion started Patient continues to be in A. fib with a heart rate between 124-147 and remains slightly hypotensive at 100/59 mmHg  Plan EGD likely tomorrow, if patient's hemodynamics get more stable  Discussed about the risks and the benefits of endoscopy with the patient in details, he understands and verbalizes consent. From GI standpoint he may be started on clear liquid diet to be kept n.p.o. post midnight unless a procedure is required from a cardiac standpoint.   LOS: 0 days   Ronnette Juniper, MD  08/10/2020, 8:16 AM

## 2020-08-10 NOTE — Sedation Documentation (Signed)
Dr Roxanne Mins attempted cardioversion x 3 unsuccessfully  Pt still in af

## 2020-08-10 NOTE — ED Notes (Signed)
PAGED DR Hal Hope TO RN CHRIS--David Vargas

## 2020-08-10 NOTE — ED Notes (Signed)
Bladders scan done 126 urine in. Pt using external male catheter at this time.

## 2020-08-10 NOTE — ED Notes (Signed)
Pt bp dropping to 80's.  Pt was symptomatic - nauseated, diaphoretic.   Placed in trendelenburg.  MD notified.  Pt given 500 ns bolus with improvement.

## 2020-08-10 NOTE — Progress Notes (Signed)
Patient ID: David Vargas, male   DOB: 1951/05/20, 69 y.o.   MRN: [id number]  [name] has been readmitted following a syncopal episode that has been found to be secondary to acute blood loss anemia from a GI bleed.   He is s/p a prostate biopsy early last week that was complicated by e. Coli sepsis for which he was admitted last week.   He was placed on tamsulosin last week to aid voiding.  He is voiding into an external catheter with some difficulty as he is confined to bedrest at this time.  His bladder felt somewhat full on exam this evening but his bladder scan PVR is only 172ml.     Imp: BPH with BOO with incomplete emptying but his PVR is only mildly elevated.    Rec: Continue tamsulosin and external catheter while he is bed confined.

## 2020-08-11 ENCOUNTER — Inpatient Hospital Stay (HOSPITAL_COMMUNITY): Payer: Medicare HMO | Admitting: Certified Registered"

## 2020-08-11 ENCOUNTER — Encounter (HOSPITAL_COMMUNITY)
Admission: EM | Disposition: A | Discharge status: Home / Self Care | Payer: Self-pay | Source: Home / Self Care | Attending: Family Medicine

## 2020-08-11 ENCOUNTER — Encounter (HOSPITAL_COMMUNITY): Payer: Self-pay | Admitting: Internal Medicine

## 2020-08-11 DIAGNOSIS — K922 Gastrointestinal hemorrhage, unspecified: Secondary | ICD-10-CM

## 2020-08-11 DIAGNOSIS — R578 Other shock: Secondary | ICD-10-CM | POA: Insufficient documentation

## 2020-08-11 DIAGNOSIS — I480 Paroxysmal atrial fibrillation: Secondary | ICD-10-CM

## 2020-08-11 DIAGNOSIS — R55 Syncope and collapse: Secondary | ICD-10-CM | POA: Insufficient documentation

## 2020-08-11 HISTORY — PX: ESOPHAGOGASTRODUODENOSCOPY (EGD) WITH PROPOFOL: SHX5813

## 2020-08-11 HISTORY — PX: HEMOSTASIS CONTROL: SHX6838

## 2020-08-11 HISTORY — PX: HEMOSTASIS CLIP PLACEMENT: SHX6857

## 2020-08-11 LAB — BASIC METABOLIC PANEL
Anion gap: 8 (ref 5–15)
BUN: 80 mg/dL — ABNORMAL HIGH (ref 8–23)
CO2: 18 mmol/L — ABNORMAL LOW (ref 22–32)
Calcium: 7.4 mg/dL — ABNORMAL LOW (ref 8.9–10.3)
Chloride: 115 mmol/L — ABNORMAL HIGH (ref 98–111)
Creatinine, Ser: 1.17 mg/dL (ref 0.61–1.24)
GFR calc Af Amer: >60 mL/min (ref >60)
GFR calc non Af Amer: >60 mL/min (ref >60)
Glucose, Bld: 152 mg/dL — ABNORMAL HIGH (ref 70–99)
Potassium: 4.4 mmol/L (ref 3.5–5.1)
Sodium: 141 mmol/L (ref 135–145)

## 2020-08-11 LAB — CBC WITH DIFFERENTIAL/PLATELET
Abs Immature Granulocytes: 0.32 10*3/uL — ABNORMAL HIGH (ref 0.00–0.07)
Basophils Absolute: 0 10*3/uL (ref 0.0–0.1)
Basophils Relative: 0 %
Eosinophils Absolute: 0 10*3/uL (ref 0.0–0.5)
Eosinophils Relative: 0 %
HCT: 21.4 % — ABNORMAL LOW (ref 39.0–52.0)
Hemoglobin: 6.8 g/dL — CL (ref 13.0–17.0)
Immature Granulocytes: 2 %
Lymphocytes Relative: 9 %
Lymphs Abs: 1.7 10*3/uL (ref 0.7–4.0)
MCH: 29.4 pg (ref 26.0–34.0)
MCHC: 31.8 g/dL (ref 30.0–36.0)
MCV: 92.6 fL (ref 80.0–100.0)
Monocytes Absolute: 1.4 10*3/uL — ABNORMAL HIGH (ref 0.1–1.0)
Monocytes Relative: 7 %
Neutro Abs: 15.7 10*3/uL — ABNORMAL HIGH (ref 1.7–7.7)
Neutrophils Relative %: 82 %
Platelets: 195 10*3/uL (ref 150–400)
RBC: 2.31 MIL/uL — ABNORMAL LOW (ref 4.22–5.81)
RDW: 15.3 % (ref 11.5–15.5)
WBC: 19.1 10*3/uL — ABNORMAL HIGH (ref 4.0–10.5)
nRBC: 0 % (ref 0.0–0.2)

## 2020-08-11 LAB — HEMOGLOBIN AND HEMATOCRIT, BLOOD
HCT: 18.8 % — ABNORMAL LOW (ref 39.0–52.0)
Hemoglobin: 6.1 g/dL — CL (ref 13.0–17.0)

## 2020-08-11 LAB — MRSA PCR SCREENING: MRSA by PCR: NEGATIVE

## 2020-08-11 LAB — GLUCOSE, CAPILLARY: Glucose-Capillary: 147 mg/dL — ABNORMAL HIGH (ref 70–99)

## 2020-08-11 LAB — CBC
HCT: 24.1 % — ABNORMAL LOW (ref 39.0–52.0)
Hemoglobin: 7.6 g/dL — ABNORMAL LOW (ref 13.0–17.0)
MCH: 29.8 pg (ref 26.0–34.0)
MCHC: 31.5 g/dL (ref 30.0–36.0)
MCV: 94.5 fL (ref 80.0–100.0)
Platelets: 234 10*3/uL (ref 150–400)
RBC: 2.55 MIL/uL — ABNORMAL LOW (ref 4.22–5.81)
RDW: 15.7 % — ABNORMAL HIGH (ref 11.5–15.5)
WBC: 27 10*3/uL — ABNORMAL HIGH (ref 4.0–10.5)
nRBC: 0.4 % — ABNORMAL HIGH (ref 0.0–0.2)

## 2020-08-11 LAB — PREPARE RBC (CROSSMATCH)

## 2020-08-11 LAB — MAGNESIUM: Magnesium: 2.1 mg/dL (ref 1.7–2.4)

## 2020-08-11 SURGERY — ESOPHAGOGASTRODUODENOSCOPY (EGD) WITH PROPOFOL
Anesthesia: Monitor Anesthesia Care

## 2020-08-11 MED ORDER — PROPOFOL 10 MG/ML IV BOLUS
INTRAVENOUS | Status: DC | PRN
  Administered 2020-08-11: 20 mg via INTRAVENOUS

## 2020-08-11 MED ORDER — SODIUM CHLORIDE 0.9% IV SOLUTION: Freq: Once | INTRAVENOUS | Status: DC

## 2020-08-11 MED ORDER — EPHEDRINE SULFATE-NACL 50-0.9 MG/10ML-% IV SOSY
PREFILLED_SYRINGE | INTRAVENOUS | Status: DC | PRN
  Administered 2020-08-11: 10 mg via INTRAVENOUS

## 2020-08-11 MED ORDER — SODIUM CHLORIDE 0.9 % IV BOLUS
1000.0000 mL | Freq: Once | INTRAVENOUS | Status: AC
  Administered 2020-08-11: 1000 mL via INTRAVENOUS

## 2020-08-11 MED ORDER — PROPOFOL 500 MG/50ML IV EMUL
INTRAVENOUS | Status: DC | PRN
Start: 2020-08-11 — End: 2020-08-11
  Administered 2020-08-11: 125 ug/kg/min via INTRAVENOUS

## 2020-08-11 MED ORDER — LACTATED RINGERS IV SOLN
INTRAVENOUS | Status: DC
  Administered 2020-08-11: 1000 mL via INTRAVENOUS

## 2020-08-11 MED ORDER — SODIUM CHLORIDE 0.9 % IV SOLN: INTRAVENOUS | Status: DC

## 2020-08-11 MED ORDER — EPINEPHRINE 1 MG/10ML IJ SOSY
PREFILLED_SYRINGE | INTRAMUSCULAR | Status: AC
  Filled 2020-08-11: qty 10

## 2020-08-11 MED ORDER — CHLORHEXIDINE GLUCONATE CLOTH 2 % EX PADS
6.0000 | MEDICATED_PAD | Freq: Every day | CUTANEOUS | Status: DC
  Administered 2020-08-11 – 2020-08-15 (×5): 6 via TOPICAL

## 2020-08-11 MED ORDER — SODIUM CHLORIDE (PF) 0.9 % IJ SOLN
PREFILLED_SYRINGE | INTRAMUSCULAR | Status: DC | PRN
  Administered 2020-08-11: 3 mL

## 2020-08-11 MED ORDER — SODIUM CHLORIDE 0.9% IV SOLUTION: Freq: Once | INTRAVENOUS | Status: AC

## 2020-08-11 SURGICAL SUPPLY — 15 items

## 2020-08-11 NOTE — ED Notes (Signed)
Dinner ordered 

## 2020-08-11 NOTE — Brief Op Note (Signed)
08/10/2020 - 08/11/2020  12:43 PM  PATIENT:  David Vargas  69 y.o. male  PRE-OPERATIVE DIAGNOSIS:  coffee ground emesis  POST-OPERATIVE DIAGNOSIS:  duodenal ulcer, mallory weiss tear  PROCEDURE:  Procedure(s): ESOPHAGOGASTRODUODENOSCOPY (EGD) WITH PROPOFOL (N/A) HEMOSTASIS CLIP PLACEMENT HEMOSTASIS CONTROL  SURGEON:  Surgeon(s) and Role:    Ronnette Juniper, MD - Primary  PHYSICIAN ASSISTANT:   ASSISTANTS: Hazel Sams, Darlene Buckner Malta, Tech   ANESTHESIA:   MAC  EBL:  Minimal  BLOOD ADMINISTERED:none  DRAINS: none   LOCAL MEDICATIONS USED:  NONE  SPECIMEN:  No Specimen  DISPOSITION OF SPECIMEN:  N/A  COUNTS:  YES  TOURNIQUET:  * No tourniquets in log *  DICTATION: .Dragon Dictation  PLAN OF CARE: Admit to inpatient   PATIENT DISPOSITION:  PACU - hemodynamically stable.   Delay start of Pharmacological VTE agent (>24hrs) due to surgical blood loss or risk of bleeding: yes

## 2020-08-11 NOTE — Progress Notes (Signed)
[place] Progress Note Patient Name: David Vargas DOB: 1951/12/03 MRN: [id number]   Date of Service  08/11/2020  HPI/Events of Note  Anemia - Hgb = 6.1.   eICU Interventions  Transfuse 1 unit PRBC now.      Intervention Category Major Interventions: Other:  Lysle Dingwall 08/11/2020, 10:19 PM

## 2020-08-11 NOTE — Progress Notes (Signed)
[facility] Progress Note Patient Name: MC BLOODWORTH DOB: Aug 03, 1951 MRN: [id number]   Date of Service  08/11/2020  HPI/Events of Note  Hypotension - BP = 88/60 with MAP = 68. Hgb = 6.8.   eICU Interventions  Plan: 1. Bolus with 0.9 NaCl 1 liter IV over 1 hour now. 2. Transfuse 1 unit PRBC now.      Intervention Category Major Interventions: Hypotension - evaluation and management;Other:  Lysle Dingwall 08/11/2020, 6:59 AM

## 2020-08-11 NOTE — Op Note (Signed)
[facility] Patient Name: David Vargas Procedure Date : 08/11/2020 MRN: [id number] Attending MD: Ronnette Juniper , MD Date of Birth: [birth year] CSN: [id number] Age: 69 Admit Type: Inpatient Procedure:                Upper GI endoscopy Indications:              Acute post hemorrhagic anemia, Hematemesis Providers:                Ronnette Juniper, MD, Benetta Spar RN, RN, Tyna Jaksch                            Technician, Laverda Sorenson, Technician Referring MD:             [facility] Medicines:                Monitored Anesthesia Care Complications:            No immediate complications. Estimated blood loss:                            Minimal. Estimated Blood Loss:     Estimated blood loss was minimal. Procedure:                Pre-Anesthesia Assessment:                           - Prior to the procedure, a History and Physical                            was performed, and patient medications and                            allergies were reviewed. The patient's tolerance of                            previous anesthesia was also reviewed. The risks                            and benefits of the procedure and the sedation                            options and risks were discussed with the patient.                            All questions were answered, and informed consent                            was obtained. Prior Anticoagulants: The patient has                            taken no previous anticoagulant or antiplatelet                            agents. ASA Grade Assessment: III - A patient with  severe systemic disease. After reviewing the risks                            and benefits, the patient was deemed in                            satisfactory condition to undergo the procedure.                           After obtaining informed consent, the endoscope was                            passed under direct vision. Throughout the                             procedure, the patient's blood pressure, pulse, and                            oxygen saturations were monitored continuously. The                            GIF-H190 ([id number]) Olympus gastroscope was                            introduced through the mouth, and advanced to the                            second part of duodenum. The upper GI endoscopy was                            accomplished without difficulty. The patient                            tolerated the procedure well. Scope In: Scope Out: Findings:      The examined esophagus was normal.      The Z-line was regular and was found 35 cm from the incisors.      A large non-bleeding Mallory-Weiss tear with stigmata of recent bleeding       was found. For hemostasis, two hemostatic clips(3 were deployed but 1       fell off) were successfully placed (MR conditional). There was no       bleeding at the end of the procedure.      The gastric fundus, gastric body, incisura, gastric antrum and pylorus       were normal.      One non-bleeding superficial duodenal ulcer with a visible vessel was       found in the first portion of the duodenum. The lesion was 8 mm in       largest dimension. Area was successfully injected with 4 mL of a       1:10,000 solution of epinephrine for hemostasis. For hemostasis, two       hemostatic clips were successfully placed (MR conditional). There was no       bleeding at the end of the procedure. Impression:               -  Normal esophagus.                           - Z-line regular, 35 cm from the incisors.                           - Mallory-Weiss tear. Clips (MR conditional) were                            placed.                           - Normal gastric fundus, gastric body, incisura,                            antrum and pylorus.                           - Non-bleeding duodenal ulcer with a visible                            vessel. Injected. Clips (MR conditional) were                             placed.                           - No specimens collected. Moderate Sedation:      Patient did not receive moderate sedation for this procedure, but       instead received monitored anesthesia care. Recommendation:           - Clear liquid diet.                           - Continue present medications, protonix IV drip                            for another 24 hours.                           - Monitor H and H and transfuse to keep Hb above 7. Procedure Code(s):        --- Professional ---                           [phone], Esophagogastroduodenoscopy, flexible,                            transoral; with control of bleeding, any method Diagnosis Code(s):        --- Professional ---                           K22.6, Gastro-esophageal laceration-hemorrhage                            syndrome  K26.4, Chronic or unspecified duodenal ulcer with                            hemorrhage                           D62, Acute posthemorrhagic anemia                           K92.0, Hematemesis CPT copyright 2019 [employer]. All rights reserved. The codes documented in this report are preliminary and upon coder review may  be revised to meet current compliance requirements. Ronnette Juniper, MD 08/11/2020 12:43:02 PM This report has been signed electronically. Number of Addenda: 0

## 2020-08-11 NOTE — Anesthesia Postprocedure Evaluation (Signed)
Anesthesia Post Note  Patient: KORRY DALGLEISH  Procedure(s) Performed: ESOPHAGOGASTRODUODENOSCOPY (EGD) WITH PROPOFOL (N/A ) HEMOSTASIS CLIP PLACEMENT HEMOSTASIS CONTROL     Patient location during evaluation: PACU Anesthesia Type: MAC Level of consciousness: awake and alert Pain management: pain level controlled Vital Signs Assessment: post-procedure vital signs reviewed and stable Respiratory status: spontaneous breathing, nonlabored ventilation, respiratory function stable and patient connected to nasal cannula oxygen Cardiovascular status: stable and blood pressure returned to baseline Postop Assessment: no apparent nausea or vomiting Anesthetic complications: no   No complications documented.  Last Vitals:  Vitals:   08/11/20 1245 08/11/20 1300  BP: 118/72 125/70  Pulse:    Resp:    Temp: 36.6 C   SpO2: 98%     Last Pain:  Vitals:   08/11/20 1300  TempSrc:   PainSc: 0-No pain                 Effie Berkshire

## 2020-08-11 NOTE — Interval H&P Note (Signed)
History and Physical Interval Note: 68/male with coffee ground emesis and anemia for an EGD.  08/11/2020 11:28 AM  David Vargas  has presented today for EGD, with the diagnosis of coffee ground emesis.  The various methods of treatment have been discussed with the patient and family. After consideration of risks, benefits and other options for treatment, the patient has consented to  Procedure(s): ESOPHAGOGASTRODUODENOSCOPY (EGD) WITH PROPOFOL (N/A) as a surgical intervention.  The patient's history has been reviewed, patient examined, no change in status, stable for surgery.  I have reviewed the patient's chart and labs.  Questions were answered to the patient's satisfaction.     Ronnette Juniper

## 2020-08-11 NOTE — Progress Notes (Signed)
NAME:  David Vargas, MRN:  [id number], DOB:  [birth year], LOS: 1 ADMISSION DATE:  08/10/2020, CONSULTATION DATE:  08/10/2020 REFERRING MD:  Delora Fuel, CHIEF COMPLAINT:  Coffee-ground emesis   Brief History   David Vargas is a 69 yo man with history of paroxysmal atrial fibrillation not on anticoagulation who was brought to the ED due to coffee-ground emesis, found to be hypovolemic and in atrial fibrillation  History of present illness   David Vargas is a 69 y.o. male with history of paroxysmal atrial fibrillation not on anticoagulation recently admitted for E. coli bacteremia after prostate biopsy discharged 3 days ago was brought to the ER after patient had at least 4 episodes of coffee-ground vomitus at home.  Patient states earlier in the day felt weak and dizzy he almost passed out and was lying on the bed.  Later he had some soup and feels lately started throwing up black/coffee-ground vomitus.  Denies noticing any black stools.  Denies taking any NSAIDs.  Patient is not on any anticoagulation or antiplatelet agents.  Past Medical History  Atrial fibrillation  Significant Hospital Events   8/16 Cardioversion attempted  Consults:  Gastroenterology Cardiology  Procedures:  8/16- Cardioversion, not successful  Significant Diagnostic Tests:    Micro Data:  08/10/2020 blood cultures x2  Antimicrobials:  03/11/2019 Rocephin  Interim history/subjective:  Awaits GI interventions.  Required total 4 units of packed red blood cells at this time.  Objective   Blood pressure 104/60, pulse 97, temperature (!) 96.5 F (35.8 C), temperature source Tympanic, resp. rate 14, height 6\' 4"  (1.93 m), weight 98.9 kg, SpO2 100 %.        Intake/Output Summary (Last 24 hours) at 08/11/2020 0935 Last data filed at 08/11/2020 8453 Gross per 24 hour  Intake 3139.67 ml  Output 1200 ml  Net 1939.67 ml   Filed Weights   08/10/20 0037  Weight: 98.9 kg    Examination: General: Well-nourished  well-developed male in no acute distress at rest HEENT: No JVD or lymphadenopathy is appreciated Neuro: Grossly intact no  focal defects CV: Heart sounds are regular PULM: Room air with O2 sats of 95%  GI: Tender in a touch positive bowel sounds appreciated GU: Amber urine Extremities: warm/dry, negative edema  Skin: no rashes or lesions    Resolved Hospital Problem list     Assessment & Plan:   Acute GI Bleed: Invasive procedure per GI services 08/11/2020 Transfuse per protocol Admit to the intensive care unit Atrial Fibrillation with Rapid Ventricular Response: Volume resuscitation as needed Cardiology input appreciated  Recent E. Coli bacteremia Continue current antimicrobial therapy  Best practice:    Labs   CBC: Recent Labs  Lab 08/05/20 0218 08/05/20 0218 08/06/20 0246 08/06/20 0246 08/07/20 0314 08/10/20 0039 08/10/20 1020 08/10/20 2052 08/11/20 0441  WBC 9.5   < > 4.6  --  4.0 11.3* 11.9*  --  19.1*  NEUTROABS 9.0*  --   --   --  3.1 9.9*  --   --  15.7*  HGB 14.9   < > 13.4   < > 14.1 9.0* 9.0* 6.3* 6.8*  HCT 46.4   < > 41.5   < > 43.4 28.1* 27.4* 19.9* 21.4*  MCV 91.7   < > 90.0  --  88.8 90.9 90.7  --  92.6  PLT 131*   < > 105*  --  113* 171 171  --  195   < > = values in this  interval not displayed.    Basic Metabolic Panel: Recent Labs  Lab 08/06/20 0246 08/07/20 0314 08/10/20 0039 08/10/20 1020 08/11/20 0441  NA 137 136 140 140 141  K 3.9 4.2 4.1 4.6 4.4  CL 105 104 106 113* 115*  CO2 23 24 22  21* 18*  GLUCOSE 111* 129* 147* 135* 152*  BUN 19 17 67* 61* 80*  CREATININE 0.95 0.94 1.11 0.87 1.17  CALCIUM 8.4* 8.6* 7.8* 7.6* 7.4*  MG 1.9 1.9  --   --  2.1   GFR: Estimated Creatinine Clearance: 72.1 mL/min (by C-G formula based on SCr of 1.17 mg/dL). Recent Labs  Lab 08/05/20 0218 08/05/20 0404 08/06/20 0246 08/07/20 0314 08/10/20 0039 08/10/20 0200 08/10/20 1020 08/11/20 0441  WBC 9.5  --    < > 4.0 11.3*  --  11.9* 19.1*   LATICACIDVEN 2.0* 1.7  --   --   --  2.1*  --   --    < > = values in this interval not displayed.    Liver Function Tests: Recent Labs  Lab 08/05/20 0218 08/06/20 0246  AST 29 44*  ALT 26 49*  ALKPHOS 67 54  BILITOT 1.1 0.8  PROT 6.3* 5.2*  ALBUMIN 3.5 2.8*   No results for input(s): LIPASE, AMYLASE in the last 168 hours. No results for input(s): AMMONIA in the last 168 hours.  ABG No results found for: PHART, PCO2ART, PO2ART, HCO3, TCO2, ACIDBASEDEF, O2SAT   Coagulation Profile: Recent Labs  Lab 08/05/20 0218 08/06/20 0246  INR 1.2 1.3*    Cardiac Enzymes: No results for input(s): CKTOTAL, CKMB, CKMBINDEX, TROPONINI in the last 168 hours.  HbA1C: No results found for: HGBA1C  CBG: No results for input(s): GLUCAP in the last 168 hours.    Critical care time: [place] ACNP Acute Care Nurse Practitioner [place] Please consult [place] 08/11/2020, 9:35 AM

## 2020-08-11 NOTE — Anesthesia Procedure Notes (Signed)
Procedure Name: MAC Date/Time: 08/11/2020 12:19 PM Performed by: Gwyndolyn Saxon, CRNA Pre-anesthesia Checklist: Patient identified, Emergency Drugs available, Suction available, Patient being monitored and Timeout performed Patient Re-evaluated:Patient Re-evaluated prior to induction Oxygen Delivery Method: Simple face mask Placement Confirmation: positive ETCO2 Dental Injury: Teeth and Oropharynx as per pre-operative assessment

## 2020-08-11 NOTE — Op Note (Signed)
EGD was performed for hematemesis and acute blood loss anemia.   Findings: A large Mallory-Weiss tear was noted immediately below the GE junction. 3 clips were deployed, 2 endoclips stayed in place. Otherwise normal-appearing esophagus, cardia fundus gastric body and antrum. Duodenal ulcer with visible vessel noted in first portion of the duodenum.  2 endoclips were deployed and 4 cc of 1 in 10,000 epinephrine was injected with good results.   Recommendations: Clear liquid diet today. Continue IV Protonix drip. Monitor H&H with plans to transfuse to keep hemoglobin above 7. Stool for H. pylori antigen/H. pylori serology. Avoid NSAIDs, aspirin and anticoagulation.  Ronnette Juniper, MD

## 2020-08-11 NOTE — Transfer of Care (Signed)
Immediate Anesthesia Transfer of Care Note  Patient: David Vargas  Procedure(s) Performed: ESOPHAGOGASTRODUODENOSCOPY (EGD) WITH PROPOFOL (N/A ) HEMOSTASIS CLIP PLACEMENT HEMOSTASIS CONTROL  Patient Location: PACU  Anesthesia Type:MAC  Level of Consciousness: drowsy and patient cooperative  Airway & Oxygen Therapy: Patient Spontanous Breathing and Patient connected to face mask oxygen  Post-op Assessment: Report given to RN and Post -op Vital signs reviewed and stable  Post vital signs: Reviewed and stable  Last Vitals:  Vitals Value Taken Time  BP 107/69 08/11/20 1243  Temp    Pulse 94 08/11/20 1244  Resp    SpO2 100 % 08/11/20 1244  Vitals shown include unvalidated device data.  Last Pain:  Vitals:   08/11/20 1121  TempSrc: Oral  PainSc: 0-No pain         Complications: No complications documented.

## 2020-08-11 NOTE — Progress Notes (Signed)
Cardiology Progress Note  Patient ID: CADEL STAIRS MRN: [id number] DOB: April 05, 1951 Date of Encounter: 08/11/2020  Primary Cardiologist: Fransico Him, MD  Subjective   Chief Complaint: Weak.  Hemoglobin still trending down.  EGD planned this morning.  Back in sinus rhythm.  HPI: Still in the emergency room.  Reports he feels weak.  ROS:  All other ROS reviewed and negative. Pertinent positives noted in the HPI.     Inpatient Medications  Scheduled Meds: . [START ON 08/13/2020] pantoprazole  40 mg Intravenous Q12H  . sucralfate  1 g Oral TID WC & HS   Continuous Infusions: . sodium chloride    . amiodarone 30 mg/hr (08/11/20 0320)  . cefTRIAXone (ROCEPHIN)  IV Stopped (08/10/20 0902)  . lactated ringers    . lactated ringers    . pantoprozole (PROTONIX) infusion 8 mg/hr (08/11/20 0827)   PRN Meds: ondansetron **OR** ondansetron (ZOFRAN) IV   Vital Signs   Vitals:   08/11/20 0815 08/11/20 0820 08/11/20 0830 08/11/20 0834  BP: 99/63 99/63 98/62  104/60  Pulse:  98 97 97  Resp: 18 16 14 14   Temp:  (!) 97.1 F (36.2 C)  (!) 96.5 F (35.8 C)  TempSrc:  Tympanic  Tympanic  SpO2:   96% 100%  Weight:      Height:        Intake/Output Summary (Last 24 hours) at 08/11/2020 0845 Last data filed at 08/11/2020 0837 Gross per 24 hour  Intake 1001.53 ml  Output 1200 ml  Net -198.47 ml   Last 3 Weights 08/10/2020 08/05/2020 02/17/2020  Weight (lbs) 218 lb 0.6 oz 218 lb 220 lb 14.4 oz  Weight (kg) 98.9 kg 98.884 kg 100.2 kg      Telemetry  Overnight telemetry shows normal sinus rhythm heart rate in the 90s, which I personally reviewed.   ECG  The most recent ECG shows atrial fibrillation with RVR, which I personally reviewed.   Physical Exam   Vitals:   08/11/20 0815 08/11/20 0820 08/11/20 0830 08/11/20 0834  BP: 99/63 99/63 98/62  104/60  Pulse:  98 97 97  Resp: 18 16 14 14   Temp:  (!) 97.1 F (36.2 C)  (!) 96.5 F (35.8 C)  TempSrc:  Tympanic  Tympanic  SpO2:    96% 100%  Weight:      Height:         Intake/Output Summary (Last 24 hours) at 08/11/2020 0845 Last data filed at 08/11/2020 0837 Gross per 24 hour  Intake 1001.53 ml  Output 1200 ml  Net -198.47 ml    Last 3 Weights 08/10/2020 08/05/2020 02/17/2020  Weight (lbs) 218 lb 0.6 oz 218 lb 220 lb 14.4 oz  Weight (kg) 98.9 kg 98.884 kg 100.2 kg    Body mass index is 26.54 kg/m.   General: Well nourished, well developed, in no acute distress Head: Atraumatic, normal size  Eyes: PEERLA, EOMI, pale conjunctiva Neck: Supple, no JVD Endocrine: No thryomegaly Cardiac: Normal S1, S2; RRR; no murmurs, rubs, or gallops Lungs: Clear to auscultation bilaterally, no wheezing, rhonchi or rales  Abd: Soft, nontender, no hepatomegaly  Ext: No edema, pulses 2+ Musculoskeletal: No deformities, BUE and BLE strength normal and equal Skin: Warm and dry, no rashes   Neuro: Alert and oriented to person, place, time, and situation, CNII-XII grossly intact, no focal deficits  Psych: Normal mood and affect   Labs  High Sensitivity Troponin:  No results for input(s): TROPONINIHS in the last 720 hours.  Cardiac EnzymesNo results for input(s): TROPONINI in the last 168 hours. No results for input(s): TROPIPOC in the last 168 hours.  Chemistry Recent Labs  Lab 08/05/20 0218 08/05/20 0218 08/06/20 0246 08/07/20 0314 08/10/20 0039 08/10/20 1020 08/11/20 0441  NA 138   < > 137   < > 140 140 141  K 4.1   < > 3.9   < > 4.1 4.6 4.4  CL 103   < > 105   < > 106 113* 115*  CO2 25   < > 23   < > 22 21* 18*  GLUCOSE 116*   < > 111*   < > 147* 135* 152*  BUN 20   < > 19   < > 67* 61* 80*  CREATININE 1.26*   < > 0.95   < > 1.11 0.87 1.17  CALCIUM 9.0   < > 8.4*   < > 7.8* 7.6* 7.4*  PROT 6.3*  --  5.2*  --   --   --   --   ALBUMIN 3.5  --  2.8*  --   --   --   --   AST 29  --  44*  --   --   --   --   ALT 26  --  49*  --   --   --   --   ALKPHOS 67  --  54  --   --   --   --   BILITOT 1.1  --  0.8  --   --    --   --   GFRNONAA 57*   < > >60   < > >60 >60 >60  GFRAA >60   < > >60   < > >60 >60 >60  ANIONGAP 10   < > 9   < > 12 6 8    < > = values in this interval not displayed.    Hematology Recent Labs  Lab 08/10/20 0039 08/10/20 0039 08/10/20 1020 08/10/20 2052 08/11/20 0441  WBC 11.3*  --  11.9*  --  19.1*  RBC 3.09*  --  3.02*  --  2.31*  HGB 9.0*   < > 9.0* 6.3* 6.8*  HCT 28.1*   < > 27.4* 19.9* 21.4*  MCV 90.9  --  90.7  --  92.6  MCH 29.1  --  29.8  --  29.4  MCHC 32.0  --  32.8  --  31.8  RDW 13.2  --  14.4  --  15.3  PLT 171  --  171  --  195   < > = values in this interval not displayed.   BNPNo results for input(s): BNP, PROBNP in the last 168 hours.  DDimer No results for input(s): DDIMER in the last 168 hours.   Radiology  No results found.  Cardiac Studies  Echo 2015: Ejection fraction 60-65%  Patient Profile  ELIN SEATS is a 69 y.o. male with paroxysmal atrial fibrillation, hypertension, hyperlipidemia who was admitted on 08/10/2020 with hemorrhagic shock secondary to GI bleed.  Cardiology consulted for atrial fibrillation with RVR.  Assessment & Plan   1.  Atrial fibrillation with RVR secondary to hemorrhagic shock -He is converted back to normal sinus rhythm.  Continue IV amiodarone while he is unable to tolerate oral intake. -We can plan to transition to a 1 month course of oral amiodarone once he is able to tolerate p.o.  For now we'll discontinue IV amiodarone  to maintain sinus rhythm. -No anticoagulation in the setting of hemorrhagic shock. -We will follow along and help with management of his A. fib while he is critically ill.  For questions or updates, please contact [place] Please consult [email or web address] for contact info under   Time Spent with Patient: I have spent a total of 15 minutes with patient reviewing hospital notes, telemetry, EKGs, labs and examining the patient as well as establishing an assessment and plan that was discussed with  the patient.  > 50% of time was spent in direct patient care.    Signed, Addison Naegeli. Audie Box, [place]  08/11/2020 8:45 AM

## 2020-08-11 NOTE — Anesthesia Preprocedure Evaluation (Addendum)
Anesthesia Evaluation  Patient identified by MRN, date of birth, ID band Patient awake    Reviewed: Allergy & Precautions, NPO status , Patient's Chart, lab work & pertinent test results  Airway Mallampati: II  TM Distance: >3 FB Neck ROM: Full    Dental  (+) Teeth Intact, Dental Advisory Given   Pulmonary neg pulmonary ROS,    breath sounds clear to auscultation       Cardiovascular hypertension, + dysrhythmias Atrial Fibrillation  Rhythm:Regular Rate:Tachycardia     Neuro/Psych negative neurological ROS  negative psych ROS   GI/Hepatic Neg liver ROS, hiatal hernia, GERD  Medicated,  Endo/Other  negative endocrine ROS  Renal/GU negative Renal ROS     Musculoskeletal  (+) Arthritis ,   Abdominal Normal abdominal exam  (+)   Peds  Hematology   Anesthesia Other Findings   Reproductive/Obstetrics                            Anesthesia Physical Anesthesia Plan  ASA: III  Anesthesia Plan: MAC   Post-op Pain Management:    Induction: Intravenous  PONV Risk Score and Plan: 0 and Propofol infusion  Airway Management Planned: Natural Airway and Simple Face Mask  Additional Equipment: None  Intra-op Plan:   Post-operative Plan:   Informed Consent: I have reviewed the patients History and Physical, chart, labs and discussed the procedure including the risks, benefits and alternatives for the proposed anesthesia with the patient or authorized representative who has indicated his/her understanding and acceptance.       Plan Discussed with: CRNA  Anesthesia Plan Comments: (Echo:  - Left ventricle: The cavity size was normal. There was moderate  concentric hypertrophy. Systolic function was normal. The  estimated ejection fraction was in the range of 60% to 65%. Wall  motion was normal; there were no regional wall motion  abnormalities. Left ventricular diastolic function  parameters  were normal.  - Tricuspid valve: There was trivial regurgitation.  - Pulmonary arteries: Systolic pressure was within the normal  range.  )        Anesthesia Quick Evaluation

## 2020-08-12 ENCOUNTER — Inpatient Hospital Stay (HOSPITAL_COMMUNITY): Payer: Medicare HMO

## 2020-08-12 LAB — CBC
HCT: 22 % — ABNORMAL LOW (ref 39.0–52.0)
Hemoglobin: 7.2 g/dL — ABNORMAL LOW (ref 13.0–17.0)
MCH: 29.4 pg (ref 26.0–34.0)
MCHC: 32.7 g/dL (ref 30.0–36.0)
MCV: 89.8 fL (ref 80.0–100.0)
Platelets: 222 10*3/uL (ref 150–400)
RBC: 2.45 MIL/uL — ABNORMAL LOW (ref 4.22–5.81)
RDW: 17.3 % — ABNORMAL HIGH (ref 11.5–15.5)
WBC: 23.6 10*3/uL — ABNORMAL HIGH (ref 4.0–10.5)
nRBC: 1.9 % — ABNORMAL HIGH (ref 0.0–0.2)

## 2020-08-12 LAB — BASIC METABOLIC PANEL
Anion gap: 9 (ref 5–15)
BUN: 80 mg/dL — ABNORMAL HIGH (ref 8–23)
CO2: 19 mmol/L — ABNORMAL LOW (ref 22–32)
Calcium: 7.8 mg/dL — ABNORMAL LOW (ref 8.9–10.3)
Chloride: 113 mmol/L — ABNORMAL HIGH (ref 98–111)
Creatinine, Ser: 1.3 mg/dL — ABNORMAL HIGH (ref 0.61–1.24)
GFR calc Af Amer: >60 mL/min (ref >60)
GFR calc non Af Amer: 55 mL/min — ABNORMAL LOW (ref >60)
Glucose, Bld: 118 mg/dL — ABNORMAL HIGH (ref 70–99)
Potassium: 3.8 mmol/L (ref 3.5–5.1)
Sodium: 141 mmol/L (ref 135–145)

## 2020-08-12 LAB — HEMOGLOBIN AND HEMATOCRIT, BLOOD
HCT: 22 % — ABNORMAL LOW (ref 39.0–52.0)
Hemoglobin: 7.2 g/dL — ABNORMAL LOW (ref 13.0–17.0)

## 2020-08-12 LAB — MAGNESIUM: Magnesium: 2.3 mg/dL (ref 1.7–2.4)

## 2020-08-12 LAB — PHOSPHORUS: Phosphorus: 3.7 mg/dL (ref 2.5–4.6)

## 2020-08-12 MED ORDER — AMIODARONE HCL 200 MG PO TABS: 400.0000 mg | ORAL_TABLET | Freq: Two times a day (BID) | ORAL | Status: DC

## 2020-08-12 MED ORDER — ATORVASTATIN CALCIUM 10 MG PO TABS
20.0000 mg | ORAL_TABLET | Freq: Every day | ORAL | Status: DC
  Administered 2020-08-12 – 2020-08-14 (×3): 20 mg via ORAL
  Filled 2020-08-12 (×3): qty 2

## 2020-08-12 MED ORDER — POLYETHYLENE GLYCOL 3350 17 G PO PACK
17.0000 g | PACK | Freq: Every day | ORAL | Status: DC
  Administered 2020-08-12 – 2020-08-14 (×3): 17 g via ORAL
  Filled 2020-08-12 (×3): qty 1

## 2020-08-12 MED ORDER — DOCUSATE SODIUM 100 MG PO CAPS
100.0000 mg | ORAL_CAPSULE | Freq: Two times a day (BID) | ORAL | Status: DC
  Administered 2020-08-12 – 2020-08-14 (×6): 100 mg via ORAL
  Filled 2020-08-12 (×6): qty 1

## 2020-08-12 MED ORDER — PANTOPRAZOLE SODIUM 40 MG IV SOLR
40.0000 mg | Freq: Two times a day (BID) | INTRAVENOUS | Status: DC
  Administered 2020-08-12 – 2020-08-14 (×5): 40 mg via INTRAVENOUS
  Filled 2020-08-12 (×5): qty 40

## 2020-08-12 MED ORDER — TAMSULOSIN HCL 0.4 MG PO CAPS
0.4000 mg | ORAL_CAPSULE | Freq: Every day | ORAL | Status: DC
  Administered 2020-08-12 – 2020-08-14 (×3): 0.4 mg via ORAL
  Filled 2020-08-12 (×3): qty 1

## 2020-08-12 MED ORDER — AMIODARONE HCL IN DEXTROSE 360-4.14 MG/200ML-% IV SOLN
30.0000 mg/h | INTRAVENOUS | Status: DC
  Administered 2020-08-12 – 2020-08-13 (×2): 30 mg/h via INTRAVENOUS
  Filled 2020-08-12 (×4): qty 200

## 2020-08-12 MED ORDER — TECHNETIUM TC 99M-LABELED RED BLOOD CELLS IV KIT
23.4000 | PACK | Freq: Once | INTRAVENOUS | Status: AC | PRN
  Administered 2020-08-12: 23.4 via INTRAVENOUS

## 2020-08-12 MED ORDER — AMIODARONE HCL IN DEXTROSE 360-4.14 MG/200ML-% IV SOLN: 30.0000 mg/h | INTRAVENOUS | Status: DC

## 2020-08-12 MED ORDER — AMIODARONE HCL 200 MG PO TABS: 200.0000 mg | ORAL_TABLET | Freq: Two times a day (BID) | ORAL | Status: DC

## 2020-08-12 NOTE — Progress Notes (Signed)
Upper EGD found and controlled source of bleeding.  Patient responded well to 1 unit of blood with hemoglobin improved to 7.2.  His MAP was also consistently higher than 65, no pressors were needed.  Patient will be transferred out of the ICU to telemetry floor.  We signed off to [facility] hospitalist.  Gaylan Gerold, DO Internal Medicine Residency My pager: [phone]

## 2020-08-12 NOTE — Plan of Care (Signed)
  Problem: Education: Goal: Knowledge of General Education information will improve Description: Including pain rating scale, medication(s)/side effects and non-pharmacologic comfort measures Outcome: Progressing   Problem: Health Behavior/Discharge Planning: Goal: Ability to manage health-related needs will improve Outcome: Progressing   Problem: Clinical Measurements: Goal: Ability to maintain clinical measurements within normal limits will improve Outcome: Progressing Goal: Will remain free from infection Outcome: Progressing Goal: Diagnostic test results will improve Outcome: Progressing Goal: Respiratory complications will improve Outcome: Progressing Goal: Cardiovascular complication will be avoided Outcome: Progressing   Problem: Clinical Measurements: Goal: Will remain free from infection Outcome: Progressing   Problem: Activity: Goal: Risk for activity intolerance will decrease Outcome: Progressing   Problem: Coping: Goal: Level of anxiety will decrease Outcome: Progressing   Problem: Pain Managment: Goal: General experience of comfort will improve Outcome: Progressing

## 2020-08-12 NOTE — Progress Notes (Signed)
Cardiology Progress Note  Patient ID: David Vargas MRN: [id number] DOB: 1951/03/09 Date of Encounter: 08/12/2020  Primary Cardiologist: Fransico Him, MD  Subjective   Chief Complaint: Hgb dropped to 6.1  HPI: Hgb dropped to 6.1. In NSR. GI with plans for bleeding scan.   ROS:  All other ROS reviewed and negative. Pertinent positives noted in the HPI.     Inpatient Medications  Scheduled Meds: . sodium chloride   Intravenous Once  . Chlorhexidine Gluconate Cloth  6 each Topical Daily  . [START ON 08/13/2020] pantoprazole  40 mg Intravenous Q12H  . sucralfate  1 g Oral TID WC & HS   Continuous Infusions: . sodium chloride    . amiodarone    . amiodarone    . cefTRIAXone (ROCEPHIN)  IV Stopped (08/11/20 1058)  . lactated ringers    . lactated ringers    . pantoprozole (PROTONIX) infusion 8 mg/hr (08/12/20 0600)   PRN Meds: ondansetron **OR** ondansetron (ZOFRAN) IV   Vital Signs   Vitals:   08/12/20 0500 08/12/20 0600 08/12/20 0730 08/12/20 0800  BP: 133/75 135/87  (!) 125/108  Pulse: 83 87  89  Resp: 10 10  18   Temp:   98.2 F (36.8 C)   TempSrc:   Oral   SpO2: 99% 98%  99%  Weight:      Height:        Intake/Output Summary (Last 24 hours) at 08/12/2020 0826 Last data filed at 08/12/2020 0600 Gross per 24 hour  Intake 4439.51 ml  Output 2500 ml  Net 1939.51 ml   Last 3 Weights 08/11/2020 08/10/2020 08/05/2020  Weight (lbs) 227 lb 11.8 oz 218 lb 0.6 oz 218 lb  Weight (kg) 103.3 kg 98.9 kg 98.884 kg      Telemetry  Overnight telemetry shows NSR 70s, which I personally reviewed.   ECG  The most recent ECG shows NSR, which I personally reviewed.   Physical Exam   Vitals:   08/12/20 0500 08/12/20 0600 08/12/20 0730 08/12/20 0800  BP: 133/75 135/87  (!) 125/108  Pulse: 83 87  89  Resp: 10 10  18   Temp:   98.2 F (36.8 C)   TempSrc:   Oral   SpO2: 99% 98%  99%  Weight:      Height:         Intake/Output Summary (Last 24 hours) at 08/12/2020  0826 Last data filed at 08/12/2020 0600 Gross per 24 hour  Intake 4439.51 ml  Output 2500 ml  Net 1939.51 ml    Last 3 Weights 08/11/2020 08/10/2020 08/05/2020  Weight (lbs) 227 lb 11.8 oz 218 lb 0.6 oz 218 lb  Weight (kg) 103.3 kg 98.9 kg 98.884 kg    Body mass index is 27.72 kg/m.   General: Well nourished, well developed, in no acute distress Head: Atraumatic, normal size  Eyes: PEERLA, EOMI  Neck: Supple, no JVD Endocrine: No thryomegaly Cardiac: Normal S1, S2; RRR; no murmurs, rubs, or gallops Lungs: Clear to auscultation bilaterally, no wheezing, rhonchi or rales  Abd: Soft, nontender, no hepatomegaly  Ext: No edema, pulses 2+ Musculoskeletal: No deformities, BUE and BLE strength normal and equal Skin: Warm and dry, no rashes   Neuro: Alert and oriented to person, place, time, and situation, CNII-XII grossly intact, no focal deficits  Psych: Normal mood and affect   Labs  High Sensitivity Troponin:  No results for input(s): TROPONINIHS in the last 720 hours.   Cardiac EnzymesNo results for input(s): TROPONINI  in the last 168 hours. No results for input(s): TROPIPOC in the last 168 hours.  Chemistry Recent Labs  Lab 08/06/20 0246 08/07/20 0314 08/10/20 0039 08/10/20 1020 08/11/20 0441  NA 137   < > 140 140 141  K 3.9   < > 4.1 4.6 4.4  CL 105   < > 106 113* 115*  CO2 23   < > 22 21* 18*  GLUCOSE 111*   < > 147* 135* 152*  BUN 19   < > 67* 61* 80*  CREATININE 0.95   < > 1.11 0.87 1.17  CALCIUM 8.4*   < > 7.8* 7.6* 7.4*  PROT 5.2*  --   --   --   --   ALBUMIN 2.8*  --   --   --   --   AST 44*  --   --   --   --   ALT 49*  --   --   --   --   ALKPHOS 54  --   --   --   --   BILITOT 0.8  --   --   --   --   GFRNONAA >60   < > >60 >60 >60  GFRAA >60   < > >60 >60 >60  ANIONGAP 9   < > 12 6 8    < > = values in this interval not displayed.    Hematology Recent Labs  Lab 08/11/20 0441 08/11/20 0441 08/11/20 1639 08/11/20 2102 08/12/20 0744  WBC 19.1*  --   27.0*  --  23.6*  RBC 2.31*  --  2.55*  --  2.45*  HGB 6.8*   < > 7.6* 6.1* 7.2*  HCT 21.4*   < > 24.1* 18.8* 22.0*  MCV 92.6  --  94.5  --  89.8  MCH 29.4  --  29.8  --  29.4  MCHC 31.8  --  31.5  --  32.7  RDW 15.3  --  15.7*  --  17.3*  PLT 195  --  234  --  222   < > = values in this interval not displayed.   BNPNo results for input(s): BNP, PROBNP in the last 168 hours.  DDimer No results for input(s): DDIMER in the last 168 hours.   Radiology  DG Chest Port 1 View  Result Date: 08/12/2020 CLINICAL DATA:  Abnormal respirations EXAM: PORTABLE CHEST 1 VIEW COMPARISON:  Radiograph 08/05/2020, CT 07/28/2020, 07/08/2020 FINDINGS: Multitude of basilar pulmonary nodules are again seen, better visualized on comparison cross-sectional imaging. Some reticular opacities towards the lung bases are similar to comparison studies as well and may reflect chronic interstitial changes. No consolidation, features of edema, pneumothorax, or effusion. The aorta is calcified. The remaining cardiomediastinal contours are unremarkable. No acute osseous or soft tissue abnormality. Degenerative changes are present in the imaged spine and shoulders. Telemetry leads overlie the chest. Linear radiodensity near the GE junction could reflect a surgical clip or button lead. IMPRESSION: 1. No acute cardiopulmonary abnormality. 2. Bibasilar pulmonary nodules are again seen, better visualized on comparison cross-sectional imaging. 3. Reticular opacities towards the lung bases are similar to comparison studies and may reflect chronic interstitial changes. 4.  Aortic Atherosclerosis (ICD10-I70.0). Electronically Signed   By: Lovena Le M.D.   On: 08/12/2020 06:38    Cardiac Studies  TTE 2015 - EF 60%  Patient Profile  David Vargas is a 69 y.o. male with pAF, HLD, admitted 08/10/2020 for hemorrhagic shock  2/2 mallory weiss tear. Cardiology consulted for Afib with RVR.   Assessment & Plan   1. Afib with RVR 2/2  hemorrhagic shock -back in NSR. Hgb keeps dropping. GI with plans for [place] bleeding scan. -due to concerns for possibly ongoing bleeding, will continue IV amiodarone -we can transition to oral once he is more stable -no AC in setting of bleed  For questions or updates, please contact [facility] Please consult [email or web address] for contact info under   Time Spent with Patient: I have spent a total of 15 minutes with patient reviewing hospital notes, telemetry, EKGs, labs and examining the patient as well as establishing an assessment and plan that was discussed with the patient.  > 50% of time was spent in direct patient care.    Signed, Addison Naegeli. Audie Box, [place]  08/12/2020 8:26 AM

## 2020-08-12 NOTE — Progress Notes (Signed)
Subjective: Patient reports he has not had any bowel movement since admission.  However drop in hemoglobin noted post endoscopy. He is currently in ICU and tolerating clear liquids, appears hemodynamically stable. Complains of mild nausea without vomiting, denies abdominal pain.  Objective: Vital signs in last 24 hours: Temp:  [96.5 F (35.8 C)-99.2 F (37.3 C)] 98.2 F (36.8 C) (08/18 0730) Pulse Rate:  [83-106] 87 (08/18 0600) Resp:  [7-26] 10 (08/18 0600) BP: (98-140)/(60-108) 135/87 (08/18 0600) SpO2:  [96 %-100 %] 98 % (08/18 0600) Weight:  [103.3 kg] 103.3 kg (08/17 2100) Weight change:  Last BM Date: 08/07/20  PE: Sitting up on the bed comfortably GENERAL: Mild pallor, in no distress ABDOMEN: Soft, nondistended, normoactive bowel sounds, nontender EXTREMITIES: No deformity  Lab Results: Results for orders placed or performed during the hospital encounter of 08/10/20 (from the past 48 hour(s))  Basic metabolic panel     Status: Abnormal   Collection Time: 08/10/20 10:20 AM  Result Value Ref Range   Sodium 140 135 - 145 mmol/L   Potassium 4.6 3.5 - 5.1 mmol/L   Chloride 113 (H) 98 - 111 mmol/L   CO2 21 (L) 22 - 32 mmol/L   Glucose, Bld 135 (H) 70 - 99 mg/dL    Comment: Glucose reference range applies only to samples taken after fasting for at least 8 hours.   BUN 61 (H) 8 - 23 mg/dL   Creatinine, Ser 0.87 0.61 - 1.24 mg/dL   Calcium 7.6 (L) 8.9 - 10.3 mg/dL   GFR calc non Af Amer >60 >60 mL/min   GFR calc Af Amer >60 >60 mL/min   Anion gap 6 5 - 15    Comment: Performed at [facility] [address], [place], [place] [address]  CBC     Status: Abnormal   Collection Time: 08/10/20 10:20 AM  Result Value Ref Range   WBC 11.9 (H) 4.0 - 10.5 K/uL   RBC 3.02 (L) 4.22 - 5.81 MIL/uL   Hemoglobin 9.0 (L) 13.0 - 17.0 g/dL   HCT 27.4 (L) 39 - 52 %   MCV 90.7 80.0 - 100.0 fL   MCH 29.8 26.0 - 34.0 pg   MCHC 32.8 30.0 - 36.0 g/dL   RDW 14.4 11.5 - 15.5 %    Platelets 171 150 - 400 K/uL   nRBC 0.0 0.0 - 0.2 %    Comment: Performed at [facility], [place] [address], [place], [place] [address]  Hemoglobin and hematocrit, blood     Status: Abnormal   Collection Time: 08/10/20  8:52 PM  Result Value Ref Range   Hemoglobin 6.3 (LL) 13.0 - 17.0 g/dL    Comment: REPEATED TO VERIFY THIS CRITICAL RESULT HAS VERIFIED AND BEEN CALLED TO Magda Bernheim RN. BY TAMEECO CALDWELL ON 08 16 2021 AT 2129, AND HAS BEEN READ BACK.     HCT 19.9 (L) 39 - 52 %    Comment: Performed at [facility], [place] [address] [place], [place] [address]  Prepare RBC (crossmatch)     Status: None   Collection Time: 08/10/20  9:35 PM  Result Value Ref Range   Order Confirmation      ORDER PROCESSED BY BLOOD BANK Performed at [facility], [place] [address], [place], [place] [address]   CBC with Differential/Platelet     Status: Abnormal   Collection Time: 08/11/20  4:41 AM  Result Value Ref Range   WBC 19.1 (  H) 4.0 - 10.5 K/uL   RBC 2.31 (L) 4.22 - 5.81 MIL/uL   Hemoglobin 6.8 (LL) 13.0 - 17.0 g/dL    Comment: REPEATED TO VERIFY CRITICAL VALUE NOTED.  VALUE IS CONSISTENT WITH PREVIOUSLY REPORTED AND CALLED VALUE.    HCT 21.4 (L) 39 - 52 %   MCV 92.6 80.0 - 100.0 fL   MCH 29.4 26.0 - 34.0 pg   MCHC 31.8 30.0 - 36.0 g/dL   RDW 15.3 11.5 - 15.5 %   Platelets 195 150 - 400 K/uL   nRBC 0.0 0.0 - 0.2 %   Neutrophils Relative % 82 %   Neutro Abs 15.7 (H) 1.7 - 7.7 K/uL   Lymphocytes Relative 9 %   Lymphs Abs 1.7 0.7 - 4.0 K/uL   Monocytes Relative 7 %   Monocytes Absolute 1.4 (H) 0 - 1 K/uL   Eosinophils Relative 0 %   Eosinophils Absolute 0.0 0 - 0 K/uL   Basophils Relative 0 %   Basophils Absolute 0.0 0 - 0 K/uL   Immature Granulocytes 2 %   Abs Immature Granulocytes 0.32 (H) 0.00 - 0.07 K/uL    Comment: Performed at [facility] [address] [place], [place] [address]  Basic metabolic panel     Status: Abnormal   Collection Time: 08/11/20   4:41 AM  Result Value Ref Range   Sodium 141 135 - 145 mmol/L   Potassium 4.4 3.5 - 5.1 mmol/L   Chloride 115 (H) 98 - 111 mmol/L   CO2 18 (L) 22 - 32 mmol/L   Glucose, Bld 152 (H) 70 - 99 mg/dL    Comment: Glucose reference range applies only to samples taken after fasting for at least 8 hours.   BUN 80 (H) 8 - 23 mg/dL   Creatinine, Ser 1.17 0.61 - 1.24 mg/dL   Calcium 7.4 (L) 8.9 - 10.3 mg/dL   GFR calc non Af Amer >60 >60 mL/min   GFR calc Af Amer >60 >60 mL/min   Anion gap 8 5 - 15    Comment: Performed at [facility] [address] [place], [place] [address]  Magnesium     Status: None   Collection Time: 08/11/20  4:41 AM  Result Value Ref Range   Magnesium 2.1 1.7 - 2.4 mg/dL    Comment: Performed at [facility] [address], [place], [place] [address]  Prepare RBC (crossmatch)     Status: None   Collection Time: 08/11/20  7:00 AM  Result Value Ref Range   Order Confirmation      ORDER PROCESSED BY BLOOD BANK Performed at [facility], [place] [address] [place], [place] [address]   CBC     Status: Abnormal   Collection Time: 08/11/20  4:39 PM  Result Value Ref Range   WBC 27.0 (H) 4.0 - 10.5 K/uL   RBC 2.55 (L) 4.22 - 5.81 MIL/uL   Hemoglobin 7.6 (L) 13.0 - 17.0 g/dL   HCT 24.1 (L) 39 - 52 %   MCV 94.5 80.0 - 100.0 fL   MCH 29.8 26.0 - 34.0 pg   MCHC 31.5 30.0 - 36.0 g/dL   RDW 15.7 (H) 11.5 - 15.5 %   Platelets 234 150 - 400 K/uL   nRBC 0.4 (H) 0.0 - 0.2 %    Comment: Performed at [facility] [address], [place], [place] [address]  Glucose, capillary     Status:  Abnormal   Collection Time: 08/11/20  8:44 PM  Result Value Ref Range   Glucose-Capillary 147 (H) 70 - 99 mg/dL    Comment: Glucose reference range applies only to samples taken after fasting for at least 8 hours.   Comment 1 Notify RN   MRSA PCR Screening     Status: None   Collection Time: 08/11/20  8:52 PM   Specimen: Nasal Mucosa; Nasopharyngeal  Result Value Ref  Range   MRSA by PCR NEGATIVE NEGATIVE    Comment:        The GeneXpert MRSA Assay (FDA approved for NASAL specimens only), is one component of a comprehensive MRSA colonization surveillance program. It is not intended to diagnose MRSA infection nor to guide or monitor treatment for MRSA infections. Performed at [facility], [place] [address] [place], [place] [address]   Hemoglobin and hematocrit, blood     Status: Abnormal   Collection Time: 08/11/20  9:02 PM  Result Value Ref Range   Hemoglobin 6.1 (LL) 13.0 - 17.0 g/dL    Comment: REPEATED TO VERIFY THIS CRITICAL RESULT HAS VERIFIED AND BEEN CALLED TO J MOTLEY,RN BY KARIE Rutland ON 08 17 2021 AT 2155, AND HAS BEEN READ BACK.     HCT 18.8 (L) 39 - 52 %    Comment: Performed at [facility], [place] [address], [place], [place] [address]  Prepare RBC (crossmatch)     Status: None   Collection Time: 08/11/20 10:19 PM  Result Value Ref Range   Order Confirmation      ORDER PROCESSED BY BLOOD BANK Performed at [facility], [place] [address], [place], [place] [address]   CBC     Status: Abnormal   Collection Time: 08/12/20  7:44 AM  Result Value Ref Range   WBC 23.6 (H) 4.0 - 10.5 K/uL   RBC 2.45 (L) 4.22 - 5.81 MIL/uL   Hemoglobin 7.2 (L) 13.0 - 17.0 g/dL   HCT 22.0 (L) 39 - 52 %   MCV 89.8 80.0 - 100.0 fL   MCH 29.4 26.0 - 34.0 pg   MCHC 32.7 30.0 - 36.0 g/dL   RDW 17.3 (H) 11.5 - 15.5 %   Platelets 222 150 - 400 K/uL   nRBC 1.9 (H) 0.0 - 0.2 %    Comment: Performed at [facility] [address] [place], [place] [address]    Studies/Results: DG Chest Port 1 View  Result Date: 08/12/2020 CLINICAL DATA:  Abnormal respirations EXAM: PORTABLE CHEST 1 VIEW COMPARISON:  Radiograph 08/05/2020, CT 07/28/2020, 07/08/2020 FINDINGS: Multitude of basilar pulmonary nodules are again seen, better visualized on comparison cross-sectional imaging. Some reticular opacities towards the lung bases are similar to  comparison studies as well and may reflect chronic interstitial changes. No consolidation, features of edema, pneumothorax, or effusion. The aorta is calcified. The remaining cardiomediastinal contours are unremarkable. No acute osseous or soft tissue abnormality. Degenerative changes are present in the imaged spine and shoulders. Telemetry leads overlie the chest. Linear radiodensity near the GE junction could reflect a surgical clip or button lead. IMPRESSION: 1. No acute cardiopulmonary abnormality. 2. Bibasilar pulmonary nodules are again seen, better visualized on comparison cross-sectional imaging. 3. Reticular opacities towards the lung bases are similar to comparison studies and may reflect chronic interstitial changes. 4.  Aortic Atherosclerosis (ICD10-I70.0). Electronically Signed   By: Lovena Le M.D.   On: 08/12/2020 06:38    Medications: I have reviewed the patient's  current medications.  Assessment: Symptomatic anemia from upper GI bleed EGD revealed deep Mallory-Weiss tear, treated with 2 Endo Clip placement Duodenal ulcer with a visible vessel treated with epinephrine injection and 2 Endo Clip placement. Hemoglobin dropped from 7.6-6.1, received 1 unit PRBC and hemoglobin is 7.2 currently. So far has received 3 units PRBC on 08/10/2020 and 2 units PRBC on 08/11/2020  Leukocytosis Atrial fibrillation, currently appears to be in sinus rhythm and with normal blood pressure  Plan: Plan stat bleeding scan to rule out active or ongoing bleeding. Continue IV Protonix drip. We will keep patient n.p.o. until results of bleeding scan are available. Discussed the same with the patient as well as his nurse.  Ronnette Juniper, MD 08/12/2020, 8:14 AM

## 2020-08-12 NOTE — Progress Notes (Addendum)
NAME:  David Vargas, MRN:  [id number], DOB:  14-Apr-1951, LOS: 2 ADMISSION DATE:  08/10/2020, CONSULTATION DATE:  08/10/2020 REFERRING MD:  Delora Fuel, CHIEF COMPLAINT:  Coffee-ground emesis   Brief History   David Vargas is a 69 yo man with history of paroxysmal atrial fibrillation not on anticoagulation (Mali score 1 - not on HTN meds), nephrolithiasis, GERD, recent ED admission 8/11 for sepsis secondary to prostate biopsy, who was brought to the ED due to coffee-ground emesis, found to be hypovolemic shock and in atrial fibrillation  History of present illness   David Vargas is a 68 y.o. male with history of paroxysmal atrial fibrillation not on anticoagulation recently admitted for E. coli bacteremia after prostate biopsy discharged 3 days ago was brought to the ER after patient had at least 4 episodes of coffee-ground vomitus at home.  Patient states earlier in the day felt weak and dizzy he almost passed out and was lying on the bed.  Later he had some soup and feels lately started throwing up black/coffee-ground vomitus.  Denies noticing any black stools.  Denies taking any NSAIDs.  Patient is not on any anticoagulation or antiplatelet agents.  Past Medical History  Atrial fibrillation Nephrolithiasis GERD  Significant Hospital Events   8/16 Cardioversion attempted  Consults:  Gastroenterology Cardiology Urology  Procedures:  8/16- Cardioversion, not successful 8/17-EGD  Significant Diagnostic Tests:  8/18 chest x-ray stable basilar pulmonary nodules.  No acute cardiopulmonary abnormality  8/17 EGD Deep Mallory-Weiss tear, treated with 2 Endo Clip placement Duodenal ulcer with a visible vessel treated with epinephrine injection and 2 Endo Clip placement.  Micro Data:  08/10/2020 blood cultures x2  Antimicrobials:  08/11/2019 Rocephin  Interim history/subjective:  Overnight: Patient received 1 unit of packed red blood cells given hemoglobin of 6.1  Patient is seen at  bedside.  He is comfortable and in no acute distress.  He denies any abdominal pain.  He states that he still feel weak given 4 days in the hospital.  Objective   Blood pressure 135/87, pulse 87, temperature 98.2 F (36.8 C), temperature source Oral, resp. rate 10, height 6\' 4"  (1.93 m), weight 103.3 kg, SpO2 98 %.        Intake/Output Summary (Last 24 hours) at 08/12/2020 0739 Last data filed at 08/12/2020 0600 Gross per 24 hour  Intake 4538.68 ml  Output 2500 ml  Net 2038.68 ml   Filed Weights   08/10/20 0037 08/11/20 2100  Weight: 98.9 kg 103.3 kg    Examination: Physical Exam Constitutional:      General: He is not in acute distress.    Appearance: He is not toxic-appearing.  HENT:     Head: Normocephalic.  Cardiovascular:     Rate and Rhythm: Normal rate and regular rhythm.     Heart sounds: No murmur heard.   Pulmonary:     Effort: Pulmonary effort is normal. No respiratory distress.  Abdominal:     General: Bowel sounds are normal. There is no distension.     Palpations: Abdomen is soft.     Tenderness: There is no abdominal tenderness.  Musculoskeletal:     Right lower leg: No edema.     Left lower leg: No edema.  Skin:    General: Skin is warm.     Coloration: Skin is not jaundiced.  Neurological:     General: No focal deficit present.     Mental Status: He is alert.  Psychiatric:  Mood and Affect: Mood normal.        Behavior: Behavior normal.      Resolved Hospital Problem list     Assessment & Plan:  Acute GI bleed secondary to Mallory-Weiss tear Atrial fibrillation with RVR Recent E. coli bacteremia secondary to prostate biopsy AKI Stable pulmonary nodules  Plan:  Acute GI Bleed secondary to Mallory-Weiss tear Hypovolemic shock EGD performed on 8/17 which showed a large Mallory-Weiss tear below the GE junction, 3 clips were deployed.  It also showed a nonbleeding superficial duodenal ulcer with visible blood vessels, 2 endoclips was  placed with epinephrine injected.   -Appreciate GI recommendations -Continue IV Protonix -Continue sucralfate -Clear liquid diet -Monitor H&H every 12 hours.  Transfuse if less than 7 -His MAP is currently within good range.  Continue to monitor   Atrial Fibrillation with Rapid Ventricular Response: In the setting of hypovolemic shock and critically ill.  Attempt cardioversion on 8/16 but unsuccessful.  Patient was on IV amiodarone and will be transition to p.o. amiodarone when he can tolerate.  No anticoagulation in the setting of recent GI bleed. -Appreciate cardiology recommendations -May transition to p.o. amiodarone per cardiology.  1 month of duration -No cardioversion -No anticoagulation   Recent E. Coli bacteremia Recent prostate biopsy Urology is following. -Continue ceftriaxone -Continue external catheter   AKI Baseline creatinine of 0.9.  Creatinine trending up 1.17 - 1.3.  Likely in the setting of hypovolemic shock.  Continue adequate fluid intake.   -Continue trending creatinine  Best practice:  Diet: Clear liquid Pain/Anxiety/Delirium protocol (if indicated): N/A VAP protocol (if indicated): N/A DVT prophylaxis: SCD GI prophylaxis: Protonix and sucralfate Glucose control: N/A Mobility: As tolerated Code Status: Full Family Communication: Will update family Disposition: ICU   Critical care time: 19 min    Gaylan Gerold, DO Internal Medicine Residency My pager: [phone]

## 2020-08-12 NOTE — Progress Notes (Signed)
Received patient from [facility]. Patients NM scan was negative for a bleed. GI states start patient on a regular diet. Patient is resting in bed with wife at bedside. Will continue to monitor.

## 2020-08-13 ENCOUNTER — Encounter (HOSPITAL_COMMUNITY): Payer: Self-pay | Admitting: Gastroenterology

## 2020-08-13 LAB — BASIC METABOLIC PANEL
Anion gap: 8 (ref 5–15)
BUN: 41 mg/dL — ABNORMAL HIGH (ref 8–23)
CO2: 20 mmol/L — ABNORMAL LOW (ref 22–32)
Calcium: 7.7 mg/dL — ABNORMAL LOW (ref 8.9–10.3)
Chloride: 107 mmol/L (ref 98–111)
Creatinine, Ser: 1.06 mg/dL (ref 0.61–1.24)
GFR calc Af Amer: >60 mL/min (ref >60)
GFR calc non Af Amer: >60 mL/min (ref >60)
Glucose, Bld: 112 mg/dL — ABNORMAL HIGH (ref 70–99)
Potassium: 3.4 mmol/L — ABNORMAL LOW (ref 3.5–5.1)
Sodium: 135 mmol/L (ref 135–145)

## 2020-08-13 LAB — CBC
HCT: 18.2 % — ABNORMAL LOW (ref 39.0–52.0)
Hemoglobin: 5.7 g/dL — CL (ref 13.0–17.0)
MCH: 28.9 pg (ref 26.0–34.0)
MCHC: 31.3 g/dL (ref 30.0–36.0)
MCV: 92.4 fL (ref 80.0–100.0)
Platelets: 186 10*3/uL (ref 150–400)
RBC: 1.97 MIL/uL — ABNORMAL LOW (ref 4.22–5.81)
RDW: 18.6 % — ABNORMAL HIGH (ref 11.5–15.5)
WBC: 12 10*3/uL — ABNORMAL HIGH (ref 4.0–10.5)
nRBC: 1 % — ABNORMAL HIGH (ref 0.0–0.2)

## 2020-08-13 LAB — HEMOGLOBIN AND HEMATOCRIT, BLOOD
HCT: 17.9 % — ABNORMAL LOW (ref 39.0–52.0)
HCT: 19.5 % — ABNORMAL LOW (ref 39.0–52.0)
Hemoglobin: 5.9 g/dL — CL (ref 13.0–17.0)
Hemoglobin: 6.3 g/dL — CL (ref 13.0–17.0)

## 2020-08-13 LAB — PREPARE RBC (CROSSMATCH)

## 2020-08-13 MED ORDER — SODIUM CHLORIDE 0.9 % IV SOLN
2.0000 g | INTRAVENOUS | Status: DC
  Administered 2020-08-14 – 2020-08-15 (×2): 2 g via INTRAVENOUS
  Filled 2020-08-13: qty 2
  Filled 2020-08-13: qty 20

## 2020-08-13 MED ORDER — AMIODARONE HCL 200 MG PO TABS: 200.0000 mg | ORAL_TABLET | Freq: Every day | ORAL | Status: DC

## 2020-08-13 MED ORDER — SODIUM CHLORIDE 0.9% IV SOLUTION: Freq: Once | INTRAVENOUS | Status: DC

## 2020-08-13 MED ORDER — AMIODARONE HCL 200 MG PO TABS
200.0000 mg | ORAL_TABLET | Freq: Two times a day (BID) | ORAL | Status: DC
  Administered 2020-08-13 – 2020-08-15 (×5): 200 mg via ORAL
  Filled 2020-08-13 (×5): qty 1

## 2020-08-13 NOTE — Evaluation (Signed)
Physical Therapy Evaluation Patient Details Name: David Vargas MRN: [id number] DOB: April 23, 1951 Today's Date: 08/13/2020   History of Present Illness  Pt is a 69 y.o. M with significant PMH of PAF who was admitted 08/10/2020 for hemorrhagic shock secondary to Spencer tear. Treated with Endo Clip placement and IV epinephrine injection.  Clinical Impression  Prior to admission, pt lives with his spouse and is independent. On PT evaluation, pt presents with decreased functional mobility in setting of functional weakness and decreased endurance. Requiring min assist for transfers, ambulating 160 feet with a walker at a min guard assist level. HR 102-116 bpm. Surprisingly, pt denies dizziness/lightheadedness and was able to tolerate a fair amount of activity given his hemoglobin was 5.7 (checked with RN prior to seeing patient). Suspect pt will progress well with hemoglobin stabilization. Recommended use of walker at home. Don't anticipate need for PT follow up; will continue to follow acutely.     Follow Up Recommendations No PT follow up;Supervision for mobility/OOB    Equipment Recommendations  None recommended by PT    Recommendations for Other Services       Precautions / Restrictions Precautions Precautions: Fall Restrictions Weight Bearing Restrictions: No      Mobility  Bed Mobility Overal bed mobility: Needs Assistance Bed Mobility: Supine to Sit;Sit to Supine     Supine to sit: Min assist Sit to supine: Modified independent (Device/Increase time)   General bed mobility comments: MinA for trunk elevation to upright  Transfers Overall transfer level: Needs assistance Equipment used: 1 person hand held assist Transfers: Sit to/from Stand Sit to Stand: Min assist         General transfer comment: Light minA to rise from elevated surface  Ambulation/Gait Ambulation/Gait assistance: Min guard Gait Distance (Feet): 160 Feet Assistive device: Rolling walker (2  wheeled) Gait Pattern/deviations: Step-through pattern;Decreased stride length;Trunk flexed Gait velocity: decreased   General Gait Details: Cues for upright posture, walker positioning, min guard for safety  Stairs            Wheelchair Mobility    Modified Rankin (Stroke Patients Only)       Balance Overall balance assessment: Needs assistance Sitting-balance support: Feet supported Sitting balance-Leahy Scale: Good     Standing balance support: Bilateral upper extremity supported Standing balance-Leahy Scale: Poor Standing balance comment: reliant on external support                             Pertinent Vitals/Pain Pain Assessment: No/denies pain    Home Living Family/patient expects to be discharged to:: Private residence Living Arrangements: Spouse/significant other Available Help at Discharge: Family Type of Home: House Home Access: Stairs to enter   Technical brewer of Steps: 2 Home Layout: Two level Home Equipment: Environmental consultant - 2 wheels;Shower seat      Prior Function Level of Independence: Independent         Comments: Enjoys Education officer, environmental        Extremity/Trunk Assessment   Upper Extremity Assessment Upper Extremity Assessment: Overall WFL for tasks assessed    Lower Extremity Assessment Lower Extremity Assessment: Overall WFL for tasks assessed    Cervical / Trunk Assessment Cervical / Trunk Assessment: Kyphotic  Communication   Communication: No difficulties  Cognition Arousal/Alertness: Awake/alert Behavior During Therapy: WFL for tasks assessed/performed Overall Cognitive Status: Within Functional Limits for tasks assessed  General Comments      Exercises     Assessment/Plan    PT Assessment Patient needs continued PT services  PT Problem List Decreased strength;Decreased activity tolerance;Decreased mobility;Decreased balance        PT Treatment Interventions DME instruction;Gait training;Stair training;Functional mobility training;Therapeutic activities;Therapeutic exercise;Balance training;Patient/family education    PT Goals (Current goals can be found in the Care Plan section)  Acute Rehab PT Goals Patient Stated Goal: go home PT Goal Formulation: With patient Time For Goal Achievement: 08/27/20 Potential to Achieve Goals: Good    Frequency Min 3X/week   Barriers to discharge        Co-evaluation               AM-PAC PT "6 Clicks" Mobility  Outcome Measure Help needed turning from your back to your side while in a flat bed without using bedrails?: None Help needed moving from lying on your back to sitting on the side of a flat bed without using bedrails?: A Little Help needed moving to and from a bed to a chair (including a wheelchair)?: A Little Help needed standing up from a chair using your arms (e.g., wheelchair or bedside chair)?: A Little Help needed to walk in hospital room?: A Little Help needed climbing 3-5 steps with a railing? : A Little 6 Click Score: 19    End of Session Equipment Utilized During Treatment: Gait belt Activity Tolerance: Patient tolerated treatment well Patient left: in bed;with call bell/phone within reach;with family/visitor present Nurse Communication: Mobility status PT Visit Diagnosis: Unsteadiness on feet (R26.81);Difficulty in walking, not elsewhere classified (R26.2)    Time: 7353-2992 PT Time Calculation (min) (ACUTE ONLY): 20 min   Charges:   PT Evaluation $PT Eval Moderate Complexity: 1 Mod          Wyona Almas, PT, DPT Acute Rehabilitation Services Pager [phone] Office [phone]   Deno Etienne 08/13/2020, 5:31 PM

## 2020-08-13 NOTE — Progress Notes (Signed)
Subjective: Patient feels well. He has been on regular diet, denies abdominal pain, nausea or vomiting. Has not had a bowel movement. No further episodes of coffee-ground or hematemesis.  Objective: Vital signs in last 24 hours: Temp:  [98.2 F (36.8 C)-98.8 F (37.1 C)] 98.2 F (36.8 C) (08/19 0724) Pulse Rate:  [83-94] 85 (08/19 0724) Resp:  [6-22] 17 (08/19 0724) BP: (100-187)/(63-92) 130/87 (08/19 0724) SpO2:  [97 %-100 %] 98 % (08/19 0724) Weight change:  Last BM Date: 08/07/20  PE: Sitting comfortably on bed GENERAL: Mild pallor ABDOMEN: Soft, nondistended, nontender, normal active bowel sounds EXTREMITIES: No deformity  Lab Results: Results for orders placed or performed during the hospital encounter of 08/10/20 (from the past 48 hour(s))  CBC     Status: Abnormal   Collection Time: 08/11/20  4:39 PM  Result Value Ref Range   WBC 27.0 (H) 4.0 - 10.5 K/uL   RBC 2.55 (L) 4.22 - 5.81 MIL/uL   Hemoglobin 7.6 (L) 13.0 - 17.0 g/dL   HCT 24.1 (L) 39 - 52 %   MCV 94.5 80.0 - 100.0 fL   MCH 29.8 26.0 - 34.0 pg   MCHC 31.5 30.0 - 36.0 g/dL   RDW 15.7 (H) 11.5 - 15.5 %   Platelets 234 150 - 400 K/uL   nRBC 0.4 (H) 0.0 - 0.2 %    Comment: Performed at [facility], [address] [place], [place] [address]  Glucose, capillary     Status: Abnormal   Collection Time: 08/11/20  8:44 PM  Result Value Ref Range   Glucose-Capillary 147 (H) 70 - 99 mg/dL    Comment: Glucose reference range applies only to samples taken after fasting for at least 8 hours.   Comment 1 Notify RN   MRSA PCR Screening     Status: None   Collection Time: 08/11/20  8:52 PM   Specimen: Nasal Mucosa; Nasopharyngeal  Result Value Ref Range   MRSA by PCR NEGATIVE NEGATIVE    Comment:        The GeneXpert MRSA Assay (FDA approved for NASAL specimens only), is one component of a comprehensive MRSA colonization surveillance program. It is not intended to diagnose MRSA infection nor to guide  or monitor treatment for MRSA infections. Performed at [facility], [place] [address] [place], [place] [address]   Hemoglobin and hematocrit, blood     Status: Abnormal   Collection Time: 08/11/20  9:02 PM  Result Value Ref Range   Hemoglobin 6.1 (LL) 13.0 - 17.0 g/dL    Comment: REPEATED TO VERIFY THIS CRITICAL RESULT HAS VERIFIED AND BEEN CALLED TO J MOTLEY,RN BY KARIE Baca ON 08 17 2021 AT 2155, AND HAS BEEN READ BACK.     HCT 18.8 (L) 39 - 52 %    Comment: Performed at [facility], [place] [address], [place], [place] [address]  Prepare RBC (crossmatch)     Status: None   Collection Time: 08/11/20 10:19 PM  Result Value Ref Range   Order Confirmation      ORDER PROCESSED BY BLOOD BANK Performed at [facility], [place] [address], [place], [place] [address]   Basic metabolic panel     Status: Abnormal   Collection Time: 08/12/20  7:44 AM  Result Value Ref Range   Sodium 141 135 - 145 mmol/L   Potassium 3.8 3.5 - 5.1 mmol/L   Chloride 113 (H) 98 - 111 mmol/L   CO2  19 (L) 22 - 32 mmol/L   Glucose, Bld 118 (H) 70 - 99 mg/dL    Comment: Glucose reference range applies only to samples taken after fasting for at least 8 hours.   BUN 80 (H) 8 - 23 mg/dL   Creatinine, Ser 1.30 (H) 0.61 - 1.24 mg/dL   Calcium 7.8 (L) 8.9 - 10.3 mg/dL   GFR calc non Af Amer 55 (L) >60 mL/min   GFR calc Af Amer >60 >60 mL/min   Anion gap 9 5 - 15    Comment: Performed at [facility] [address], [place], [place] [address]  Magnesium     Status: None   Collection Time: 08/12/20  7:44 AM  Result Value Ref Range   Magnesium 2.3 1.7 - 2.4 mg/dL    Comment: Performed at [facility] [address] [place], [place] [address]  Phosphorus     Status: None   Collection Time: 08/12/20  7:44 AM  Result Value Ref Range   Phosphorus 3.7 2.5 - 4.6 mg/dL    Comment: Performed at [facility] [address], [place], [place] [address]  CBC     Status: Abnormal    Collection Time: 08/12/20  7:44 AM  Result Value Ref Range   WBC 23.6 (H) 4.0 - 10.5 K/uL   RBC 2.45 (L) 4.22 - 5.81 MIL/uL   Hemoglobin 7.2 (L) 13.0 - 17.0 g/dL   HCT 22.0 (L) 39 - 52 %   MCV 89.8 80.0 - 100.0 fL   MCH 29.4 26.0 - 34.0 pg   MCHC 32.7 30.0 - 36.0 g/dL   RDW 17.3 (H) 11.5 - 15.5 %   Platelets 222 150 - 400 K/uL   nRBC 1.9 (H) 0.0 - 0.2 %    Comment: Performed at [facility] [address], [place], [place] [address]  Hemoglobin and hematocrit, blood     Status: Abnormal   Collection Time: 08/12/20  7:53 PM  Result Value Ref Range   Hemoglobin 7.2 (L) 13.0 - 17.0 g/dL   HCT 22.0 (L) 39 - 52 %    Comment: Performed at [facility] [address], [place], [place] [address]    Studies/Results: NM GI Blood Loss  Result Date: 08/12/2020 CLINICAL DATA:  69 year old male with melena. Endoscopy demonstrated Mallory-Weiss tear and duodenal ulcer. Evaluate for GI bleed. EXAM: NUCLEAR MEDICINE GASTROINTESTINAL BLEEDING SCAN TECHNIQUE: Sequential abdominal images were obtained following intravenous administration of Tc-22m labeled red blood cells. RADIOPHARMACEUTICALS:  23.4 mCi Tc-46m pertechnetate in-vitro labeled red cells. COMPARISON:  None. FINDINGS: There is physiologic uptake in the liver, cardiac blood pool, as well as activity in the major vasculature. No activity identified conforming to the bowel to suggest active GI bleed. IMPRESSION: No scintigraphic evidence of active GI bleed. Electronically Signed   By: Anner Crete M.D.   On: 08/12/2020 16:18   DG Chest Port 1 View  Result Date: 08/12/2020 CLINICAL DATA:  Abnormal respirations EXAM: PORTABLE CHEST 1 VIEW COMPARISON:  Radiograph 08/05/2020, CT 07/28/2020, 07/08/2020 FINDINGS: Multitude of basilar pulmonary nodules are again seen, better visualized on comparison cross-sectional imaging. Some reticular opacities towards the lung bases are similar to comparison studies as well and may reflect chronic  interstitial changes. No consolidation, features of edema, pneumothorax, or effusion. The aorta is calcified. The remaining cardiomediastinal contours are unremarkable. No acute osseous or soft tissue abnormality. Degenerative changes are present in the imaged spine and shoulders.  Telemetry leads overlie the chest. Linear radiodensity near the GE junction could reflect a surgical clip or button lead. IMPRESSION: 1. No acute cardiopulmonary abnormality. 2. Bibasilar pulmonary nodules are again seen, better visualized on comparison cross-sectional imaging. 3. Reticular opacities towards the lung bases are similar to comparison studies and may reflect chronic interstitial changes. 4.  Aortic Atherosclerosis (ICD10-I70.0). Electronically Signed   By: Lovena Le M.D.   On: 08/12/2020 06:38    Medications: I have reviewed the patient's current medications.  Assessment: Acute blood loss anemia with a Mallory-Weiss tear and duodenal ulcer with visible vessel, treated with Endo Clip placement and IV epinephrine injection Hemoglobin has stabilized at 7.2 Nuclear bleeding scan did not show any evidence of active bleeding  Plan: Recommend pantoprazole 40 mg p.o. twice daily for 2 months. Recommend Carafate suspension 1 g 4 times daily for 2 weeks. I will follow up with him as an outpatient, and plan a repeat endoscopy as an outpatient in 2 months. Advised patient to avoid NSAIDs and aspirin. Okay to DC from GI standpoint.   Ronnette Juniper, MD 08/13/2020, 8:21 AM

## 2020-08-13 NOTE — Progress Notes (Signed)
PROGRESS NOTE    David Vargas  YIF:[id number] DOB: 22-Jul-1951 DOA: 08/10/2020 PCP: Hulan Fess, MD   Brief Narrative: David Vargas is a 69 y.o. male with a history of paroxysmal atrial fibrillation, recently diagnosed E. Coli bacteremia on antibiotics. Patient presented secondary to hematemesis and subsequent acute blood loss anemia, hypotension in addition to developing atrial fibrillation with RVR. GI, cardiology and PCCM consulted for management.   Assessment & Plan:   Principal Problem:   Acute GI bleeding Active Problems:   Paroxysmal atrial fibrillation with rapid ventricular response (HCC)   Atrial fibrillation with RVR (HCC)   Acute blood loss anemia   GI bleed   Hematemesis Acute UGI bleeding GI consulted. Patient underwent EGD on 8/17 which was significant for deep Mallory-Weiss tear which was treated with 2 Endo Clip placement in addition to a duodenal ulcer with visible vessel treated with epinephrine injection and 2 Endo Clip placement. -Continue Protonix -Follow up H. Pylori testing  Acute blood loss anemia Secondary to above. Patient received a total of 7 units of PRBC to date. Hemoglobin has dropped again to 5.7 -Give 2 units of PRBC -Daily CBC  Hypovolemic shock Hemorrhagic shock Hypotension Documented but appears patient did not meet criteria as he was not started on vasopressor support. Managed with fluid resuscitation. Secondary to above. Resolved.  Paroxysmal atrial fibrillation with RVR Cardiology consulted. Secondary to acute blood loss anemia. Patient underwent DC cardioversion x3 in the ED without successful conversion to sinus rhythm. Patient managed on IV amiodarone while in the ICU and has been transitioned to oral amiodarone. -Cardiology recommendations: Amiodarone (200 mg BID x7 days, then 200 mg x21 days, then discontinue), no anticoagulation secondary to CHADSVASC of 1. Will need follow-up with primary cardiologist, Dr.  Radford Pax  AKI Baseline creatinine of 0.9-1. Peak creatinine of 1.3 and has now returned to baseline  GERD -Continue protonix  Hyperlipidemia -Continue Lipitor  History of E. Coli Bacteremia Recent prostate biopsy Urology on board. Continue antibiotic course until 8/23 per previous recommendations. -Urology recommendations: Continue tamsulosin and external urinary catheter -Continue Ceftriaxone 2 g daily   DVT prophylaxis: SCDs Code Status:   Code Status: Full Code Family Communication: None at bedside Disposition Plan: Discharge likely in 24-72 hours pending stability of hemoglobin   Consultants:   PCCM  Gastroenterology  Cardiology  Urology  Procedures:   UPPER GI ENDOSCOPY (08/11/2020) Impression:               - Normal esophagus.                           - Z-line regular, 35 cm from the incisors.                           - Mallory-Weiss tear. Clips (MR conditional) were                            placed.                           - Normal gastric fundus, gastric body, incisura,                            antrum and pylorus.                           -  Non-bleeding duodenal ulcer with a visible                            vessel. Injected. Clips (MR conditional) were                            placed.                           - No specimens collected.  Recommendation:           - Clear liquid diet.                           - Continue present medications, protonix IV drip                            for another 24 hours.                           - Monitor H and H and transfuse to keep Hb above 7.  Antimicrobials:  Ceftriaxone IV    Subjective: No issues overnight. No blood stool or hematemesis  Objective: Vitals:   08/12/20 1942 08/12/20 2345 08/13/20 0418 08/13/20 0724  BP: 121/63 118/69 100/75 130/87  Pulse: 89 90 88 85  Resp: 16 18 16 17   Temp: 98.7 F (37.1 C) 98.5 F (36.9 C) 98.2 F (36.8 C) 98.2 F (36.8 C)  TempSrc: Oral  Oral   SpO2:  100% 100% 100% 98%  Weight:      Height:        Intake/Output Summary (Last 24 hours) at 08/13/2020 1614 Last data filed at 08/13/2020 0323 Gross per 24 hour  Intake --  Output 1400 ml  Net -1400 ml   Filed Weights   08/10/20 0037 08/11/20 2100  Weight: 98.9 kg 103.3 kg    Examination:  General exam: Appears calm and comfortable Respiratory system: Clear to auscultation. Respiratory effort normal. Cardiovascular system: S1 & S2 heard, RRR. No murmurs, rubs, gallops or clicks. Gastrointestinal system: Abdomen is nondistended, soft and nontender. No organomegaly or masses felt. Normal bowel sounds heard. Central nervous system: Alert and oriented. No focal neurological deficits. Musculoskeletal: No edema. No calf tenderness Skin: No cyanosis. No rashes Psychiatry: Judgement and insight appear normal. Mood & affect appropriate.     Data Reviewed: I have personally reviewed following labs and imaging studies  CBC Lab Results  Component Value Date   WBC 12.0 (H) 08/13/2020   RBC 1.97 (L) 08/13/2020   HGB 5.7 (LL) 08/13/2020   HCT 18.2 (L) 08/13/2020   MCV 92.4 08/13/2020   MCH 28.9 08/13/2020   PLT 186 08/13/2020   MCHC 31.3 08/13/2020   RDW 18.6 (H) 08/13/2020   LYMPHSABS 1.7 08/11/2020   MONOABS 1.4 (H) 08/11/2020   EOSABS 0.0 08/11/2020   BASOSABS 0.0 85/01/7740     Last metabolic panel Lab Results  Component Value Date   NA 135 08/13/2020   K 3.4 (L) 08/13/2020   CL 107 08/13/2020   CO2 20 (L) 08/13/2020   BUN 41 (H) 08/13/2020   CREATININE 1.06 08/13/2020   GLUCOSE 112 (H) 08/13/2020   GFRNONAA >60 08/13/2020   GFRAA >60 08/13/2020   CALCIUM 7.7 (L) 08/13/2020   PHOS 3.7  08/12/2020   PROT 5.2 (L) 08/06/2020   ALBUMIN 2.8 (L) 08/06/2020   BILITOT 0.8 08/06/2020   ALKPHOS 54 08/06/2020   AST 44 (H) 08/06/2020   ALT 49 (H) 08/06/2020   ANIONGAP 8 08/13/2020    CBG (last 3)  Recent Labs    08/11/20 2044  GLUCAP 147*     GFR: Estimated  Creatinine Clearance: 79.6 mL/min (by C-G formula based on SCr of 1.06 mg/dL).  Coagulation Profile: No results for input(s): INR, PROTIME in the last 168 hours.  Recent Results (from the past 240 hour(s))  Blood culture (routine single)     Status: Abnormal   Collection Time: 08/05/20  2:39 AM   Specimen: BLOOD  Result Value Ref Range Status   Specimen Description BLOOD SITE NOT SPECIFIED  Final   Special Requests   Final    BOTTLES DRAWN AEROBIC AND ANAEROBIC Blood Culture results may not be optimal due to an inadequate volume of blood received in culture bottles   Culture  Setup Time   Final    GRAM NEGATIVE RODS IN BOTH AEROBIC AND ANAEROBIC BOTTLES Organism ID to follow CRITICAL RESULT CALLED TO, READ BACK BY AND VERIFIED WITH: B,MANCHERIL PHARMD @1721  08/05/20 EB Performed at [facility], [address] [place], [place] [address]    Culture ESCHERICHIA COLI (A)  Final   Report Status 08/07/2020 FINAL  Final   Organism ID, Bacteria ESCHERICHIA COLI  Final      Susceptibility   Escherichia coli - MIC*    AMPICILLIN >=32 RESISTANT Resistant     CEFAZOLIN <=4 SENSITIVE Sensitive     CEFEPIME <=0.12 SENSITIVE Sensitive     CEFTAZIDIME <=1 SENSITIVE Sensitive     CEFTRIAXONE <=0.25 SENSITIVE Sensitive     CIPROFLOXACIN >=4 RESISTANT Resistant     GENTAMICIN >=16 RESISTANT Resistant     IMIPENEM <=0.25 SENSITIVE Sensitive     TRIMETH/SULFA >=320 RESISTANT Resistant     AMPICILLIN/SULBACTAM 4 SENSITIVE Sensitive     PIP/TAZO <=4 SENSITIVE Sensitive     * ESCHERICHIA COLI  Blood Culture ID Panel (Reflexed)     Status: Abnormal   Collection Time: 08/05/20  2:39 AM  Result Value Ref Range Status   Enterococcus faecalis NOT DETECTED NOT DETECTED Final   Enterococcus Faecium NOT DETECTED NOT DETECTED Final   Listeria monocytogenes NOT DETECTED NOT DETECTED Final   Staphylococcus species NOT DETECTED NOT DETECTED Final   Staphylococcus aureus (BCID) NOT DETECTED NOT  DETECTED Final   Staphylococcus epidermidis NOT DETECTED NOT DETECTED Final   Staphylococcus lugdunensis NOT DETECTED NOT DETECTED Final   Streptococcus species NOT DETECTED NOT DETECTED Final   Streptococcus agalactiae NOT DETECTED NOT DETECTED Final   Streptococcus pneumoniae NOT DETECTED NOT DETECTED Final   Streptococcus pyogenes NOT DETECTED NOT DETECTED Final   A.calcoaceticus-baumannii NOT DETECTED NOT DETECTED Final   Bacteroides fragilis NOT DETECTED NOT DETECTED Final   Enterobacterales DETECTED (A) NOT DETECTED Final    Comment: Enterobacterales represent a large order of gram negative bacteria, not a single organism. RESULT CALLED TO, READ BACK BY AND VERIFIED WITH: B,MANCHERIL @1721  08/05/20 EB    Enterobacter cloacae complex NOT DETECTED NOT DETECTED Final   Escherichia coli DETECTED (A) NOT DETECTED Final    Comment: RESULT CALLED TO, READ BACK BY AND VERIFIED WITH: B,MANCHERIL @1721  08/05/20 EB    Klebsiella aerogenes NOT DETECTED NOT DETECTED Final   Klebsiella oxytoca NOT DETECTED NOT DETECTED Final   Klebsiella pneumoniae NOT DETECTED  NOT DETECTED Final   Proteus species NOT DETECTED NOT DETECTED Final   Salmonella species NOT DETECTED NOT DETECTED Final   Serratia marcescens NOT DETECTED NOT DETECTED Final   Haemophilus influenzae NOT DETECTED NOT DETECTED Final   Neisseria meningitidis NOT DETECTED NOT DETECTED Final   Pseudomonas aeruginosa NOT DETECTED NOT DETECTED Final   Stenotrophomonas maltophilia NOT DETECTED NOT DETECTED Final   Candida albicans NOT DETECTED NOT DETECTED Final   Candida auris NOT DETECTED NOT DETECTED Final   Candida glabrata NOT DETECTED NOT DETECTED Final   Candida krusei NOT DETECTED NOT DETECTED Final   Candida parapsilosis NOT DETECTED NOT DETECTED Final   Candida tropicalis NOT DETECTED NOT DETECTED Final   Cryptococcus neoformans/gattii NOT DETECTED NOT DETECTED Final   CTX-M ESBL NOT DETECTED NOT DETECTED Final   Carbapenem  resistance IMP NOT DETECTED NOT DETECTED Final   Carbapenem resistance KPC NOT DETECTED NOT DETECTED Final   Carbapenem resistance NDM NOT DETECTED NOT DETECTED Final   Carbapenem resist OXA 48 LIKE NOT DETECTED NOT DETECTED Final   Carbapenem resistance VIM NOT DETECTED NOT DETECTED Final    Comment: Performed at [facility], [address] [place], [place] [address]  Urine culture     Status: Abnormal   Collection Time: 08/05/20  2:40 AM   Specimen: In/Out Cath Urine  Result Value Ref Range Status   Specimen Description IN/OUT CATH URINE  Final   Special Requests   Final    NONE Performed at [facility], [place] [address], [place], [place] [address]    Culture >=100,000 COLONIES/mL ESCHERICHIA COLI (A)  Final   Report Status 08/06/2020 FINAL  Final   Organism ID, Bacteria ESCHERICHIA COLI (A)  Final      Susceptibility   Escherichia coli - MIC*    AMPICILLIN >=32 RESISTANT Resistant     CEFAZOLIN <=4 SENSITIVE Sensitive     CEFTRIAXONE <=0.25 SENSITIVE Sensitive     CIPROFLOXACIN >=4 RESISTANT Resistant     GENTAMICIN >=16 RESISTANT Resistant     IMIPENEM <=0.25 SENSITIVE Sensitive     NITROFURANTOIN 32 SENSITIVE Sensitive     TRIMETH/SULFA >=320 RESISTANT Resistant     AMPICILLIN/SULBACTAM 8 SENSITIVE Sensitive     PIP/TAZO <=4 SENSITIVE Sensitive     * >=100,000 COLONIES/mL ESCHERICHIA COLI  SARS Coronavirus 2 by RT PCR (hospital order, performed in [facility] hospital lab) Nasopharyngeal Nasopharyngeal Swab     Status: None   Collection Time: 08/05/20  4:34 AM   Specimen: Nasopharyngeal Swab  Result Value Ref Range Status   SARS Coronavirus 2 NEGATIVE NEGATIVE Final    Comment: (NOTE) SARS-CoV-2 target nucleic acids are NOT DETECTED.  The SARS-CoV-2 RNA is generally detectable in upper and lower respiratory specimens during the acute phase of infection. The lowest concentration of SARS-CoV-2 viral copies this assay can detect is 250 copies / mL. A negative  result does not preclude SARS-CoV-2 infection and should not be used as the sole basis for treatment or other patient management decisions.  A negative result may occur with improper specimen collection / handling, submission of specimen other than nasopharyngeal swab, presence of viral mutation(s) within the areas targeted by this assay, and inadequate number of viral copies (<250 copies / mL). A negative result must be combined with clinical observations, patient history, and epidemiological information.  Fact Sheet for Patients:   StrictlyIdeas.no  Fact Sheet for Healthcare Providers: BankingDealers.co.za  This test is not yet approved or  cleared by the [place] and has been authorized for detection and/or diagnosis of SARS-CoV-2 by FDA under an Emergency Use Authorization (EUA).  This EUA will remain in effect (meaning this test can be used) for the duration of the COVID-19 declaration under Section 564(b)(1) of the Act, 21 U.S.C. section 360bbb-3(b)(1), unless the authorization is terminated or revoked sooner.  Performed at [facility], [place] [address], [place], [place] [address]   SARS Coronavirus 2 by RT PCR (hospital order, performed in [facility] hospital lab) Nasopharyngeal Nasopharyngeal Swab     Status: None   Collection Time: 08/10/20  5:01 AM   Specimen: Nasopharyngeal Swab  Result Value Ref Range Status   SARS Coronavirus 2 NEGATIVE NEGATIVE Final    Comment: (NOTE) SARS-CoV-2 target nucleic acids are NOT DETECTED.  The SARS-CoV-2 RNA is generally detectable in upper and lower respiratory specimens during the acute phase of infection. The lowest concentration of SARS-CoV-2 viral copies this assay can detect is 250 copies / mL. A negative result does not preclude SARS-CoV-2 infection and should not be used as the sole basis for treatment or other patient management decisions.  A negative result may  occur with improper specimen collection / handling, submission of specimen other than nasopharyngeal swab, presence of viral mutation(s) within the areas targeted by this assay, and inadequate number of viral copies (<250 copies / mL). A negative result must be combined with clinical observations, patient history, and epidemiological information.  Fact Sheet for Patients:   StrictlyIdeas.no  Fact Sheet for Healthcare Providers: BankingDealers.co.za  This test is not yet approved or  cleared by the [place] FDA and has been authorized for detection and/or diagnosis of SARS-CoV-2 by FDA under an Emergency Use Authorization (EUA).  This EUA will remain in effect (meaning this test can be used) for the duration of the COVID-19 declaration under Section 564(b)(1) of the Act, 21 U.S.C. section 360bbb-3(b)(1), unless the authorization is terminated or revoked sooner.  Performed at [facility], [place] [address] [place], [place] [address]   MRSA PCR Screening     Status: None   Collection Time: 08/11/20  8:52 PM   Specimen: Nasal Mucosa; Nasopharyngeal  Result Value Ref Range Status   MRSA by PCR NEGATIVE NEGATIVE Final    Comment:        The GeneXpert MRSA Assay (FDA approved for NASAL specimens only), is one component of a comprehensive MRSA colonization surveillance program. It is not intended to diagnose MRSA infection nor to guide or monitor treatment for MRSA infections. Performed at [facility], [place] [address] [place], [place] [address]         Radiology Studies: NM GI Blood Loss  Result Date: 08/12/2020 CLINICAL DATA:  69 year old male with melena. Endoscopy demonstrated Mallory-Weiss tear and duodenal ulcer. Evaluate for GI bleed. EXAM: NUCLEAR MEDICINE GASTROINTESTINAL BLEEDING SCAN TECHNIQUE: Sequential abdominal images were obtained following intravenous administration of Tc-63m labeled red blood  cells. RADIOPHARMACEUTICALS:  23.4 mCi Tc-72m pertechnetate in-vitro labeled red cells. COMPARISON:  None. FINDINGS: There is physiologic uptake in the liver, cardiac blood pool, as well as activity in the major vasculature. No activity identified conforming to the bowel to suggest active GI bleed. IMPRESSION: No scintigraphic evidence of active GI bleed. Electronically Signed   By: Anner Crete M.D.   On: 08/12/2020 16:18   DG Chest Port 1 View  Result Date: 08/12/2020 CLINICAL DATA:  Abnormal respirations EXAM: PORTABLE CHEST 1 VIEW COMPARISON:  Radiograph 08/05/2020, CT 07/28/2020, 07/08/2020 FINDINGS: Multitude of basilar pulmonary nodules are again seen, better visualized on comparison cross-sectional imaging. Some reticular opacities towards the lung bases are similar to comparison studies as well and may reflect chronic interstitial changes. No consolidation, features of edema, pneumothorax, or effusion. The aorta is calcified. The remaining cardiomediastinal contours are unremarkable. No acute osseous or soft tissue abnormality. Degenerative changes are present in the imaged spine and shoulders. Telemetry leads overlie the chest. Linear radiodensity near the GE junction could reflect a surgical clip or button lead. IMPRESSION: 1. No acute cardiopulmonary abnormality. 2. Bibasilar pulmonary nodules are again seen, better visualized on comparison cross-sectional imaging. 3. Reticular opacities towards the lung bases are similar to comparison studies and may reflect chronic interstitial changes. 4.  Aortic Atherosclerosis (ICD10-I70.0). Electronically Signed   By: Lovena Le M.D.   On: 08/12/2020 06:38        Scheduled Meds: . sodium chloride   Intravenous Once  . sodium chloride   Intravenous Once  . amiodarone  200 mg Oral BID   Followed by  . [START ON 08/20/2020] amiodarone  200 mg Oral Daily  . atorvastatin  20 mg Oral Daily  . Chlorhexidine Gluconate Cloth  6 each Topical Daily  .  docusate sodium  100 mg Oral BID  . pantoprazole  40 mg Intravenous Q12H  . polyethylene glycol  17 g Oral Daily  . sucralfate  1 g Oral TID WC & HS  . tamsulosin  0.4 mg Oral QPC supper   Continuous Infusions:   LOS: 3 days     Cordelia Poche, MD [facility] 08/13/2020, 4:14 PM  If 7PM-7AM, please contact night-coverage [email or web address]

## 2020-08-13 NOTE — Progress Notes (Signed)
Cardiology Progress Note  Patient ID: David Vargas MRN: [id number] DOB: 1951/12/25 Date of Encounter: 08/13/2020  Primary Cardiologist: Fransico Him, MD  Subjective   Chief Complaint: eating this AM. Feels better.  HPI: In NSR. NM bleeding study negative. Possible DC.   ROS:  All other ROS reviewed and negative. Pertinent positives noted in the HPI.     Inpatient Medications  Scheduled Meds: . sodium chloride   Intravenous Once  . amiodarone  200 mg Oral BID   Followed by  . [START ON 08/20/2020] amiodarone  200 mg Oral Daily  . atorvastatin  20 mg Oral Daily  . Chlorhexidine Gluconate Cloth  6 each Topical Daily  . docusate sodium  100 mg Oral BID  . pantoprazole  40 mg Intravenous Q12H  . polyethylene glycol  17 g Oral Daily  . sucralfate  1 g Oral TID WC & HS  . tamsulosin  0.4 mg Oral QPC supper   Continuous Infusions: . cefTRIAXone (ROCEPHIN)  IV 2 g (08/12/20 0933)   PRN Meds: ondansetron **OR** ondansetron (ZOFRAN) IV   Vital Signs   Vitals:   08/12/20 1942 08/12/20 2345 08/13/20 0418 08/13/20 0724  BP: 121/63 118/69 100/75 130/87  Pulse: 89 90 88 85  Resp: 16 18 16 17   Temp: 98.7 F (37.1 C) 98.5 F (36.9 C) 98.2 F (36.8 C) 98.2 F (36.8 C)  TempSrc: Oral  Oral   SpO2: 100% 100% 100% 98%  Weight:      Height:        Intake/Output Summary (Last 24 hours) at 08/13/2020 0922 Last data filed at 08/13/2020 0323 Gross per 24 hour  Intake 151.83 ml  Output 2400 ml  Net -2248.17 ml   Last 3 Weights 08/11/2020 08/10/2020 08/05/2020  Weight (lbs) 227 lb 11.8 oz 218 lb 0.6 oz 218 lb  Weight (kg) 103.3 kg 98.9 kg 98.884 kg      Telemetry  Overnight telemetry shows SR, which I personally reviewed.   ECG  The most recent ECG shows SR, which I personally reviewed.   Physical Exam   Vitals:   08/12/20 1942 08/12/20 2345 08/13/20 0418 08/13/20 0724  BP: 121/63 118/69 100/75 130/87  Pulse: 89 90 88 85  Resp: 16 18 16 17   Temp: 98.7 F (37.1 C) 98.5  F (36.9 C) 98.2 F (36.8 C) 98.2 F (36.8 C)  TempSrc: Oral  Oral   SpO2: 100% 100% 100% 98%  Weight:      Height:         Intake/Output Summary (Last 24 hours) at 08/13/2020 0922 Last data filed at 08/13/2020 0323 Gross per 24 hour  Intake 151.83 ml  Output 2400 ml  Net -2248.17 ml    Last 3 Weights 08/11/2020 08/10/2020 08/05/2020  Weight (lbs) 227 lb 11.8 oz 218 lb 0.6 oz 218 lb  Weight (kg) 103.3 kg 98.9 kg 98.884 kg    Body mass index is 27.72 kg/m.  General: Well nourished, well developed, in no acute distress Head: Atraumatic, normal size  Eyes: PEERLA, EOMI  Neck: Supple, no JVD Endocrine: No thryomegaly Cardiac: Normal S1, S2; RRR; 2/6 SEM Lungs: Clear to auscultation bilaterally, no wheezing, rhonchi or rales  Abd: Soft, nontender, no hepatomegaly  Ext: No edema, pulses 2+ Musculoskeletal: No deformities, BUE and BLE strength normal and equal Skin: Warm and dry, no rashes   Neuro: Alert and oriented to person, place, time, and situation, CNII-XII grossly intact, no focal deficits  Psych: Normal mood and  affect   Labs  High Sensitivity Troponin:  No results for input(s): TROPONINIHS in the last 720 hours.   Cardiac EnzymesNo results for input(s): TROPONINI in the last 168 hours. No results for input(s): TROPIPOC in the last 168 hours.  Chemistry Recent Labs  Lab 08/10/20 1020 08/11/20 0441 08/12/20 0744  NA 140 141 141  K 4.6 4.4 3.8  CL 113* 115* 113*  CO2 21* 18* 19*  GLUCOSE 135* 152* 118*  BUN 61* 80* 80*  CREATININE 0.87 1.17 1.30*  CALCIUM 7.6* 7.4* 7.8*  GFRNONAA >60 >60 55*  GFRAA >60 >60 >60  ANIONGAP 6 8 9     Hematology Recent Labs  Lab 08/11/20 0441 08/11/20 0441 08/11/20 1639 08/11/20 1639 08/11/20 2102 08/12/20 0744 08/12/20 1953  WBC 19.1*  --  27.0*  --   --  23.6*  --   RBC 2.31*  --  2.55*  --   --  2.45*  --   HGB 6.8*   < > 7.6*   < > 6.1* 7.2* 7.2*  HCT 21.4*   < > 24.1*   < > 18.8* 22.0* 22.0*  MCV 92.6  --  94.5  --    --  89.8  --   MCH 29.4  --  29.8  --   --  29.4  --   MCHC 31.8  --  31.5  --   --  32.7  --   RDW 15.3  --  15.7*  --   --  17.3*  --   PLT 195  --  234  --   --  222  --    < > = values in this interval not displayed.   BNPNo results for input(s): BNP, PROBNP in the last 168 hours.  DDimer No results for input(s): DDIMER in the last 168 hours.   Radiology  NM GI Blood Loss  Result Date: 08/12/2020 CLINICAL DATA:  69 year old male with melena. Endoscopy demonstrated Mallory-Weiss tear and duodenal ulcer. Evaluate for GI bleed. EXAM: NUCLEAR MEDICINE GASTROINTESTINAL BLEEDING SCAN TECHNIQUE: Sequential abdominal images were obtained following intravenous administration of Tc-53m labeled red blood cells. RADIOPHARMACEUTICALS:  23.4 mCi Tc-64m pertechnetate in-vitro labeled red cells. COMPARISON:  None. FINDINGS: There is physiologic uptake in the liver, cardiac blood pool, as well as activity in the major vasculature. No activity identified conforming to the bowel to suggest active GI bleed. IMPRESSION: No scintigraphic evidence of active GI bleed. Electronically Signed   By: Anner Crete M.D.   On: 08/12/2020 16:18   DG Chest Port 1 View  Result Date: 08/12/2020 CLINICAL DATA:  Abnormal respirations EXAM: PORTABLE CHEST 1 VIEW COMPARISON:  Radiograph 08/05/2020, CT 07/28/2020, 07/08/2020 FINDINGS: Multitude of basilar pulmonary nodules are again seen, better visualized on comparison cross-sectional imaging. Some reticular opacities towards the lung bases are similar to comparison studies as well and may reflect chronic interstitial changes. No consolidation, features of edema, pneumothorax, or effusion. The aorta is calcified. The remaining cardiomediastinal contours are unremarkable. No acute osseous or soft tissue abnormality. Degenerative changes are present in the imaged spine and shoulders. Telemetry leads overlie the chest. Linear radiodensity near the GE junction could reflect a surgical  clip or button lead. IMPRESSION: 1. No acute cardiopulmonary abnormality. 2. Bibasilar pulmonary nodules are again seen, better visualized on comparison cross-sectional imaging. 3. Reticular opacities towards the lung bases are similar to comparison studies and may reflect chronic interstitial changes. 4.  Aortic Atherosclerosis (ICD10-I70.0). Electronically Signed   By: March Rummage  [facility] M.D.   On: 08/12/2020 06:38    Patient Profile  David Vargas is a 69 y.o. male with pAF admitted 08/10/2020 for hemorrhagic shock 2/2 Mallory Weiss tear. Cardiology consulted for Afib with RVR.   Assessment & Plan   1. Afib 2/2 hemorrhagic shock -transition to 1 month of amiodarone therapy and stop (200 mg BID x 7 days and then 200 mg daily for 21 days and stop) -no AC (CHADSVASC = 1; age, reports he does not have HTN) -we will arrange follow-up with Dr. Radford Pax in 4-6 weeks.   [facility] will sign off.   Medication Recommendations:  Amiodarone as above Other recommendations (labs, testing, etc):  none Follow up as an outpatient:  4-6 weeks with Dr. Radford Pax   For questions or updates, please contact [facility] Please consult [email or web address] for contact info under   Time Spent with Patient: I have spent a total of 15 minutes with patient reviewing hospital notes, telemetry, EKGs, labs and examining the patient as well as establishing an assessment and plan that was discussed with the patient.  > 50% of time was spent in direct patient care.    Signed, Addison Naegeli. Audie Box, [place]  08/13/2020 9:22 AM

## 2020-08-14 ENCOUNTER — Inpatient Hospital Stay (HOSPITAL_COMMUNITY): Payer: Medicare HMO

## 2020-08-14 LAB — TYPE AND SCREEN
ABO/RH(D): O POS
Antibody Screen: NEGATIVE
Unit division: 0
Unit division: 0
Unit division: 0
Unit division: 0
Unit division: 0
Unit division: 0
Unit division: 0

## 2020-08-14 LAB — BPAM RBC
Blood Product Expiration Date: 202108242359
Blood Product Expiration Date: 202109112359
Blood Product Expiration Date: 202109122359
Blood Product Expiration Date: 202109122359
Blood Product Expiration Date: 202109182359
Blood Product Expiration Date: 202109192359
Blood Product Expiration Date: 202109202359
ISSUE DATE / TIME: 202108160329
ISSUE DATE / TIME: 202108160531
ISSUE DATE / TIME: 202108162149
ISSUE DATE / TIME: 202108170802
ISSUE DATE / TIME: 202108172247
ISSUE DATE / TIME: 202108191634
ISSUE DATE / TIME: 202108192118
Unit Type and Rh: 5100
Unit Type and Rh: 5100
Unit Type and Rh: 5100
Unit Type and Rh: 5100
Unit Type and Rh: 5100
Unit Type and Rh: 5100
Unit Type and Rh: 5100

## 2020-08-14 LAB — CBC
HCT: 23 % — ABNORMAL LOW (ref 39.0–52.0)
Hemoglobin: 7.4 g/dL — ABNORMAL LOW (ref 13.0–17.0)
MCH: 29.8 pg (ref 26.0–34.0)
MCHC: 32.2 g/dL (ref 30.0–36.0)
MCV: 92.7 fL (ref 80.0–100.0)
Platelets: 186 10*3/uL (ref 150–400)
RBC: 2.48 MIL/uL — ABNORMAL LOW (ref 4.22–5.81)
RDW: 17.4 % — ABNORMAL HIGH (ref 11.5–15.5)
WBC: 11.9 10*3/uL — ABNORMAL HIGH (ref 4.0–10.5)
nRBC: 0.6 % — ABNORMAL HIGH (ref 0.0–0.2)

## 2020-08-14 LAB — BASIC METABOLIC PANEL
Anion gap: 7 (ref 5–15)
BUN: 25 mg/dL — ABNORMAL HIGH (ref 8–23)
CO2: 25 mmol/L (ref 22–32)
Calcium: 7.9 mg/dL — ABNORMAL LOW (ref 8.9–10.3)
Chloride: 108 mmol/L (ref 98–111)
Creatinine, Ser: 1.04 mg/dL (ref 0.61–1.24)
GFR calc Af Amer: >60 mL/min (ref >60)
GFR calc non Af Amer: >60 mL/min (ref >60)
Glucose, Bld: 110 mg/dL — ABNORMAL HIGH (ref 70–99)
Potassium: 3.3 mmol/L — ABNORMAL LOW (ref 3.5–5.1)
Sodium: 140 mmol/L (ref 135–145)

## 2020-08-14 LAB — HEMOGLOBIN AND HEMATOCRIT, BLOOD
HCT: 24.3 % — ABNORMAL LOW (ref 39.0–52.0)
Hemoglobin: 7.7 g/dL — ABNORMAL LOW (ref 13.0–17.0)

## 2020-08-14 LAB — H. PYLORI ANTIBODY, IGG: H Pylori IgG: 0.81 Index Value — ABNORMAL HIGH (ref 0.00–0.79)

## 2020-08-14 MED ORDER — ZOLPIDEM TARTRATE 5 MG PO TABS
5.0000 mg | ORAL_TABLET | Freq: Every evening | ORAL | Status: DC | PRN
  Administered 2020-08-14: 5 mg via ORAL
  Filled 2020-08-14: qty 1

## 2020-08-14 MED ORDER — IOHEXOL 300 MG/ML  SOLN
100.0000 mL | Freq: Once | INTRAMUSCULAR | Status: AC | PRN
  Administered 2020-08-14: 100 mL via INTRAVENOUS

## 2020-08-14 MED ORDER — POTASSIUM CHLORIDE CRYS ER 20 MEQ PO TBCR
40.0000 meq | EXTENDED_RELEASE_TABLET | Freq: Once | ORAL | Status: AC
  Administered 2020-08-14: 40 meq via ORAL
  Filled 2020-08-14: qty 2

## 2020-08-14 NOTE — Progress Notes (Signed)
Occupational Therapy Treatment Patient Details Name: David Vargas MRN: [id number] DOB: 07-15-1951 Today's Date: 08/14/2020    History of present illness Pt is a 69 y.o. M with significant PMH of PAF who was admitted 08/10/2020 for hemorrhagic shock secondary to [place] tear. Treated with Endo Clip placement and IV epinephrine injection.   OT comments  PTA, pt lives with wife and reports Independence in all daily tasks without use of AD. Pt presents now with diagnoses above and mild deficits in standing balance and endurance. Pt requires grossly Min guard for short distance mobility and LB ADLs due to deficits. Pt denied dizziness throughout session. Pt would benefit from use of RW and shower chair initially on return home to maximize safety during daily tasks with pt/wife agreeable. Pt would benefit from skilled OT services at acute level to maximize independence, but no OT needs anticipated on DC. Plan to further progress ADL mobility, activity tolerance, and safety with DME during next session.    Follow Up Recommendations  No OT follow up;Supervision - Intermittent (Supervision for showering/mobility initially)    Equipment Recommendations  None recommended by OT (has all needed DME)    Recommendations for Other Services      Precautions / Restrictions Precautions Precautions: Fall Restrictions Weight Bearing Restrictions: No       Mobility Bed Mobility Overal bed mobility: Needs Assistance Bed Mobility: Supine to Sit;Sit to Supine     Supine to sit: Supervision Sit to supine: Supervision   General bed mobility comments: Supervision with increased time  Transfers Overall transfer level: Needs assistance Equipment used: None;1 person hand held assist (IV pole) Transfers: Sit to/from Stand;Stand Pivot Transfers Sit to Stand: Min guard;From elevated surface Stand pivot transfers: Min guard       General transfer comment: min guard for steadying    Balance  Overall balance assessment: Needs assistance Sitting-balance support: Feet supported Sitting balance-Leahy Scale: Good     Standing balance support: During functional activity;No upper extremity supported Standing balance-Leahy Scale: Poor Standing balance comment: reliant on external support, use of IV pole or reaching for sink, minor LOB when stepping back to bed with pt reaching out to OT for external support                           ADL either performed or assessed with clinical judgement   ADL Overall ADL's : Needs assistance/impaired Eating/Feeding: Independent;Sitting   Grooming: Min guard;Standing;Wash/dry hands Grooming Details (indicate cue type and reason): min guard for maintaining stability in standing without AD Upper Body Bathing: Independent;Sitting   Lower Body Bathing: Min guard;Sit to/from stand   Upper Body Dressing : Independent;Sitting   Lower Body Dressing: Min guard;Sit to/from stand   Toilet Transfer: Min guard;Ambulation;Regular Toilet (IV pole) Toilet Transfer Details (indicate cue type and reason): use of IV pole, min guard, unsteadiness without AD. heavy use of grab bars to get off of low toilet due to tall stature Toileting- Clothing Manipulation and Hygiene: Sitting/lateral lean;Supervision/safety Toileting - Clothing Manipulation Details (indicate cue type and reason): Supervision with lateral lean for peri care     Functional mobility during ADLs: Min guard;Cueing for safety;Cueing for sequencing General ADL Comments: Pt with minor deficits in standing balance and endurance due to limited OOB activities since illness     Vision Baseline Vision/History: Wears glasses Wears Glasses: At all times Patient Visual Report: No change from baseline Vision Assessment?: No apparent visual deficits  Perception     Praxis      Cognition Arousal/Alertness: Awake/alert Behavior During Therapy: WFL for tasks assessed/performed Overall  Cognitive Status: Within Functional Limits for tasks assessed                                          Exercises     Shoulder Instructions       General Comments Pt's wife present during session and reports she has placed a shower chair in pt's shower in preparation for DC home. Pt with unsteadiness in standing, minor LOB without support. Recommend use of RW for maintaining safety at home intitially. Pt already has RW at home. Pt denied dizziness throughout session    Pertinent Vitals/ Pain       Pain Assessment: No/denies pain  Home Living Family/patient expects to be discharged to:: Private residence Living Arrangements: Spouse/significant other Available Help at Discharge: Family Type of Home: House Home Access: Stairs to enter Technical brewer of Steps: 2   Home Layout: Two level Alternate Level Stairs-Number of Steps: 6   Bathroom Shower/Tub: Occupational psychologist: Handicapped height     Home Equipment: Environmental consultant - 2 wheels;Shower seat          Prior Functioning/Environment Level of Independence: Independent        Comments: Independent with all ADLs, IADls and mobility. Pt was playing golf frequently   Frequency  Min 2X/week        Progress Toward Goals  OT Goals(current goals can now be found in the care plan section)     Acute Rehab OT Goals Patient Stated Goal: resolve Hgb issues OT Goal Formulation: With patient/family Time For Goal Achievement: 08/28/20 Potential to Achieve Goals: Good ADL Goals Pt Will Perform Grooming: with modified independence;standing Pt Will Transfer to Toilet: with modified independence;ambulating;bedside commode Pt Will Perform Toileting - Clothing Manipulation and hygiene: with modified independence;sit to/from stand;sitting/lateral leans  Plan      Co-evaluation                 AM-PAC OT "6 Clicks" Daily Activity     Outcome Measure   Help from another person eating  meals?: None Help from another person taking care of personal grooming?: A Little Help from another person toileting, which includes using toliet, bedpan, or urinal?: A Little Help from another person bathing (including washing, rinsing, drying)?: A Little Help from another person to put on and taking off regular upper body clothing?: None Help from another person to put on and taking off regular lower body clothing?: A Little 6 Click Score: 20    End of Session Equipment Utilized During Treatment: Gait belt  OT Visit Diagnosis: Unsteadiness on feet (R26.81);Other abnormalities of gait and mobility (R26.89);Muscle weakness (generalized) (M62.81)   Activity Tolerance Patient tolerated treatment well   Patient Left in bed;with call bell/phone within reach;with bed alarm set;with family/visitor present   Nurse Communication          Time: 2637-8588 OT Time Calculation (min): 20 min  Charges: OT General Charges $OT Visit: 1 Visit OT Evaluation $OT Eval Moderate Complexity: 1 Mod  Layla Maw, OTR/L   Layla Maw 08/14/2020, 12:08 PM

## 2020-08-14 NOTE — Progress Notes (Signed)
Subjective: Patient noted to have dropped his hemoglobin from 7.2-5.9 yesterday, subsequently received 1 unit PRBC and hemoglobin is 7.4 today morning. He has not had any bowel movement since admission. Denies vomiting. He complains of mild pain over left flank area and generalized abdominal discomfort.   Objective: Vital signs in last 24 hours: Temp:  [98.3 F (36.8 C)-99 F (37.2 C)] 98.5 F (36.9 C) (08/20 0800) Pulse Rate:  [87-93] 88 (08/20 0800) Resp:  [16-18] 18 (08/20 0800) BP: (104-131)/(67-84) 116/67 (08/20 0800) SpO2:  [97 %-100 %] 97 % (08/20 0800) Weight change:  Last BM Date: 08/13/20  PE: Prominent pallor GENERAL:not in distress, able to speak in full sentences ABDOMEN: Soft, nondistended, nontender, normoactive bowel sounds EXTREMITIES: No deformity, no edema  Lab Results: Results for orders placed or performed during the hospital encounter of 08/10/20 (from the past 48 hour(s))  Hemoglobin and hematocrit, blood     Status: Abnormal   Collection Time: 08/12/20  7:53 PM  Result Value Ref Range   Hemoglobin 7.2 (L) 13.0 - 17.0 g/dL   HCT 22.0 (L) 39 - 52 %    Comment: Performed at [facility], [address] [place], [place] [address]  Basic metabolic panel     Status: Abnormal   Collection Time: 08/13/20 10:00 AM  Result Value Ref Range   Sodium 135 135 - 145 mmol/L   Potassium 3.4 (L) 3.5 - 5.1 mmol/L   Chloride 107 98 - 111 mmol/L   CO2 20 (L) 22 - 32 mmol/L   Glucose, Bld 112 (H) 70 - 99 mg/dL    Comment: Glucose reference range applies only to samples taken after fasting for at least 8 hours.   BUN 41 (H) 8 - 23 mg/dL   Creatinine, Ser 1.06 0.61 - 1.24 mg/dL   Calcium 7.7 (L) 8.9 - 10.3 mg/dL   GFR calc non Af Amer >60 >60 mL/min   GFR calc Af Amer >60 >60 mL/min   Anion gap 8 5 - 15    Comment: Performed at [facility] [address], [place], [place] [address]  Hemoglobin and hematocrit, blood     Status: Abnormal   Collection  Time: 08/13/20 10:00 AM  Result Value Ref Range   Hemoglobin 5.9 (LL) 13.0 - 17.0 g/dL    Comment: REPEATED TO VERIFY THIS CRITICAL RESULT HAS VERIFIED AND BEEN CALLED TO S. COOPER BY K'LA SANDERS ON 08 19 2021 AT 1048, AND HAS BEEN READ BACK.     HCT 17.9 (L) 39 - 52 %    Comment: Performed at [facility], [place] [address], [place], [place] [address]  CBC     Status: Abnormal   Collection Time: 08/13/20  1:14 PM  Result Value Ref Range   WBC 12.0 (H) 4.0 - 10.5 K/uL   RBC 1.97 (L) 4.22 - 5.81 MIL/uL   Hemoglobin 5.7 (LL) 13.0 - 17.0 g/dL    Comment: REPEATED TO VERIFY CRITICAL VALUE NOTED.  VALUE IS CONSISTENT WITH PREVIOUSLY REPORTED AND CALLED VALUE.    HCT 18.2 (L) 39 - 52 %   MCV 92.4 80.0 - 100.0 fL   MCH 28.9 26.0 - 34.0 pg   MCHC 31.3 30.0 - 36.0 g/dL   RDW 18.6 (H) 11.5 - 15.5 %   Platelets 186 150 - 400 K/uL   nRBC 1.0 (H) 0.0 - 0.2 %    Comment: Performed at [facility] [place],  [place] [address]  Prepare RBC (crossmatch)     Status: None   Collection Time: 08/13/20  4:12 PM  Result Value Ref Range   Order Confirmation      ORDER PROCESSED BY BLOOD BANK Performed at [facility], [place] [address] [place], [place] [address]   Hemoglobin and hematocrit, blood     Status: Abnormal   Collection Time: 08/13/20 10:06 PM  Result Value Ref Range   Hemoglobin 6.3 (LL) 13.0 - 17.0 g/dL    Comment: REPEATED TO VERIFY CRITICAL VALUE NOTED.  VALUE IS CONSISTENT WITH PREVIOUSLY REPORTED AND CALLED VALUE.    HCT 19.5 (L) 39 - 52 %    Comment: Performed at [facility], [place] [address] [place], [place] [address]  CBC     Status: Abnormal   Collection Time: 08/14/20  8:45 AM  Result Value Ref Range   WBC 11.9 (H) 4.0 - 10.5 K/uL   RBC 2.48 (L) 4.22 - 5.81 MIL/uL   Hemoglobin 7.4 (L) 13.0 - 17.0 g/dL   HCT 23.0 (L) 39 - 52 %   MCV 92.7 80.0 - 100.0 fL   MCH 29.8 26.0 - 34.0 pg   MCHC 32.2 30.0 - 36.0 g/dL   RDW 17.4 (H) 11.5 - 15.5 %    Platelets 186 150 - 400 K/uL   nRBC 0.6 (H) 0.0 - 0.2 %    Comment: Performed at [facility] [address], [place], [place] [address]    Studies/Results: NM GI Blood Loss  Result Date: 08/12/2020 CLINICAL DATA:  69 year old male with melena. Endoscopy demonstrated Mallory-Weiss tear and duodenal ulcer. Evaluate for GI bleed. EXAM: NUCLEAR MEDICINE GASTROINTESTINAL BLEEDING SCAN TECHNIQUE: Sequential abdominal images were obtained following intravenous administration of Tc-80m labeled red blood cells. RADIOPHARMACEUTICALS:  23.4 mCi Tc-50m pertechnetate in-vitro labeled red cells. COMPARISON:  None. FINDINGS: There is physiologic uptake in the liver, cardiac blood pool, as well as activity in the major vasculature. No activity identified conforming to the bowel to suggest active GI bleed. IMPRESSION: No scintigraphic evidence of active GI bleed. Electronically Signed   By: Anner Crete M.D.   On: 08/12/2020 16:18    Medications: I have reviewed the patient's current medications.  Assessment: Patient noted to have a deep Mallory-Weiss tear treated with 2 Endo Clip placement and a duodenal ulcer with visible vessel treated with 2 endoclips and epinephrine injection Although patient has not had further melena/hematochezia/ coffee-ground emesis/hematemesis, he continues to drop his hemoglobin  BUN is trending down, patient remains hemodynamically stable and bleeding scan was negative from 08/12/2020  He is on PPI twice daily as well as Carafate  So far patient has received 6 units of PRBC transfusion.  Plan: Patient ate bacon's and egg for breakfast today, will not be able to perform endoscopy today. We will get a CAT scan of the abdomen and pelvis to rule out other etiology of blood loss. Plan to keep him n.p.o. post midnight for possible EGD in a.m. depending upon CAT scan findings. Discussed the same with the patient at bedside, he verbalized understanding and  consents.  Ronnette Juniper, MD 08/14/2020, 9:13 AM

## 2020-08-14 NOTE — Progress Notes (Signed)
PROGRESS NOTE    David Vargas  PJA:[id number] DOB: 04/07/1951 DOA: 08/10/2020 PCP: Hulan Fess, MD   Brief Narrative: David Vargas is a 69 y.o. male with a history of paroxysmal atrial fibrillation, recently diagnosed E. Coli bacteremia on antibiotics. Patient presented secondary to hematemesis and subsequent acute blood loss anemia, hypotension in addition to developing atrial fibrillation with RVR. GI, cardiology and PCCM consulted for management.   Assessment & Plan:   Principal Problem:   Acute GI bleeding Active Problems:   Paroxysmal atrial fibrillation with rapid ventricular response (HCC)   Atrial fibrillation with RVR (HCC)   Acute blood loss anemia   GI bleed   Hematemesis Acute UGI bleeding GI consulted. Patient underwent EGD on 8/17 which was significant for deep Mallory-Weiss tear which was treated with 2 Endo Clip placement in addition to a duodenal ulcer with visible vessel treated with epinephrine injection and 2 Endo Clip placement. Mildly elevated H. Pylori IgG ab result -Continue Protonix  Acute blood loss anemia Secondary to above. Patient had received a total of 7 units of PRBC to date. Hemoglobin has dropped again to 5.7. patient given an additional 2 units of PRBC with rebound hemoglobin of 7.4. GI re-consulted and ordered CTA abdomen/pelvis which shows migrated mechanical clip -Will need to discuss with GI for recommendations -Daily CBC  Hypovolemic shock Hemorrhagic shock Hypotension Documented but appears patient did not meet criteria as he was not started on vasopressor support. Managed with fluid resuscitation. Secondary to above. Resolved.  Paroxysmal atrial fibrillation with RVR Cardiology consulted. Secondary to acute blood loss anemia. Patient underwent DC cardioversion x3 in the ED without successful conversion to sinus rhythm. Patient managed on IV amiodarone while in the ICU and has been transitioned to oral amiodarone. -Cardiology  recommendations: Amiodarone (200 mg BID x7 days, then 200 mg x21 days, then discontinue), no anticoagulation secondary to CHADSVASC of 1. Will need follow-up with primary cardiologist, Dr. Radford Pax  AKI Baseline creatinine of 0.9-1. Peak creatinine of 1.3 and has now returned to baseline  GERD -Continue protonix  Hyperlipidemia -Continue Lipitor  History of E. Coli Bacteremia Recent prostate biopsy Urology on board. Continue antibiotic course until 8/23 per previous recommendations. -Urology recommendations: Continue tamsulosin and external urinary catheter -Continue Ceftriaxone 2 g daily   DVT prophylaxis: SCDs Code Status:   Code Status: Full Code Family Communication: Wife at bedside Disposition Plan: Discharge likely in 24-72 hours pending stability of hemoglobin   Consultants:   PCCM  Gastroenterology  Cardiology  Urology  Procedures:   UPPER GI ENDOSCOPY (08/11/2020) Impression:               - Normal esophagus.                           - Z-line regular, 35 cm from the incisors.                           - Mallory-Weiss tear. Clips (MR conditional) were                            placed.                           - Normal gastric fundus, gastric body, incisura,  antrum and pylorus.                           - Non-bleeding duodenal ulcer with a visible                            vessel. Injected. Clips (MR conditional) were                            placed.                           - No specimens collected.  Recommendation:           - Clear liquid diet.                           - Continue present medications, protonix IV drip                            for another 24 hours.                           - Monitor H and H and transfuse to keep Hb above 7.  Antimicrobials:  Ceftriaxone IV    Subjective: No bleeding noted. He has not had a bowel movement yet, though.   Objective: Vitals:   08/13/20 2100 08/13/20 2145 08/13/20  2223 08/14/20 0800  BP: 120/70 104/84 124/67 116/67  Pulse: 89 87 91 88  Resp:  16 18 18   Temp: 99 F (37.2 C) 98.6 F (37 C) 98.3 F (36.8 C) 98.5 F (36.9 C)  TempSrc: Oral Oral Oral   SpO2: 100% 98% 98% 97%  Weight:      Height:        Intake/Output Summary (Last 24 hours) at 08/14/2020 1400 Last data filed at 08/14/2020 0606 Gross per 24 hour  Intake 623 ml  Output 1500 ml  Net -877 ml   Filed Weights   08/10/20 0037 08/11/20 2100  Weight: 98.9 kg 103.3 kg    Examination:  General exam: Appears calm and comfortable Respiratory system: Clear to auscultation. Respiratory effort normal. Cardiovascular system: S1 & S2 heard, RRR. No murmurs, rubs, gallops or clicks. Gastrointestinal system: Abdomen is nondistended, soft and nontender. No organomegaly or masses felt. Normal bowel sounds heard. Central nervous system: Alert and oriented. No focal neurological deficits. Musculoskeletal: No edema. No calf tenderness Skin: No cyanosis. No rashes Psychiatry: Judgement and insight appear normal. Mood & affect appropriate.     Data Reviewed: I have personally reviewed following labs and imaging studies  CBC Lab Results  Component Value Date   WBC 11.9 (H) 08/14/2020   RBC 2.48 (L) 08/14/2020   HGB 7.4 (L) 08/14/2020   HCT 23.0 (L) 08/14/2020   MCV 92.7 08/14/2020   MCH 29.8 08/14/2020   PLT 186 08/14/2020   MCHC 32.2 08/14/2020   RDW 17.4 (H) 08/14/2020   LYMPHSABS 1.7 08/11/2020   MONOABS 1.4 (H) 08/11/2020   EOSABS 0.0 08/11/2020   BASOSABS 0.0 23/76/2831     Last metabolic panel Lab Results  Component Value Date   NA 140 08/14/2020   K 3.3 (L) 08/14/2020   CL 108 08/14/2020   CO2 25 08/14/2020  BUN 25 (H) 08/14/2020   CREATININE 1.04 08/14/2020   GLUCOSE 110 (H) 08/14/2020   GFRNONAA >60 08/14/2020   GFRAA >60 08/14/2020   CALCIUM 7.9 (L) 08/14/2020   PHOS 3.7 08/12/2020   PROT 5.2 (L) 08/06/2020   ALBUMIN 2.8 (L) 08/06/2020   BILITOT 0.8  08/06/2020   ALKPHOS 54 08/06/2020   AST 44 (H) 08/06/2020   ALT 49 (H) 08/06/2020   ANIONGAP 7 08/14/2020    CBG (last 3)  Recent Labs    08/11/20 2044  GLUCAP 147*     GFR: Estimated Creatinine Clearance: 81.1 mL/min (by C-G formula based on SCr of 1.04 mg/dL).  Coagulation Profile: No results for input(s): INR, PROTIME in the last 168 hours.  Recent Results (from the past 240 hour(s))  Blood culture (routine single)     Status: Abnormal   Collection Time: 08/05/20  2:39 AM   Specimen: BLOOD  Result Value Ref Range Status   Specimen Description BLOOD SITE NOT SPECIFIED  Final   Special Requests   Final    BOTTLES DRAWN AEROBIC AND ANAEROBIC Blood Culture results may not be optimal due to an inadequate volume of blood received in culture bottles   Culture  Setup Time   Final    GRAM NEGATIVE RODS IN BOTH AEROBIC AND ANAEROBIC BOTTLES Organism ID to follow CRITICAL RESULT CALLED TO, READ BACK BY AND VERIFIED WITH: B,MANCHERIL PHARMD @1721  08/05/20 EB Performed at [facility], [place] [address] [place], [place] [address]    Culture ESCHERICHIA COLI (A)  Final   Report Status 08/07/2020 FINAL  Final   Organism ID, Bacteria ESCHERICHIA COLI  Final      Susceptibility   Escherichia coli - MIC*    AMPICILLIN >=32 RESISTANT Resistant     CEFAZOLIN <=4 SENSITIVE Sensitive     CEFEPIME <=0.12 SENSITIVE Sensitive     CEFTAZIDIME <=1 SENSITIVE Sensitive     CEFTRIAXONE <=0.25 SENSITIVE Sensitive     CIPROFLOXACIN >=4 RESISTANT Resistant     GENTAMICIN >=16 RESISTANT Resistant     IMIPENEM <=0.25 SENSITIVE Sensitive     TRIMETH/SULFA >=320 RESISTANT Resistant     AMPICILLIN/SULBACTAM 4 SENSITIVE Sensitive     PIP/TAZO <=4 SENSITIVE Sensitive     * ESCHERICHIA COLI  Blood Culture ID Panel (Reflexed)     Status: Abnormal   Collection Time: 08/05/20  2:39 AM  Result Value Ref Range Status   Enterococcus faecalis NOT DETECTED NOT DETECTED Final   Enterococcus Faecium NOT  DETECTED NOT DETECTED Final   Listeria monocytogenes NOT DETECTED NOT DETECTED Final   Staphylococcus species NOT DETECTED NOT DETECTED Final   Staphylococcus aureus (BCID) NOT DETECTED NOT DETECTED Final   Staphylococcus epidermidis NOT DETECTED NOT DETECTED Final   Staphylococcus lugdunensis NOT DETECTED NOT DETECTED Final   Streptococcus species NOT DETECTED NOT DETECTED Final   Streptococcus agalactiae NOT DETECTED NOT DETECTED Final   Streptococcus pneumoniae NOT DETECTED NOT DETECTED Final   Streptococcus pyogenes NOT DETECTED NOT DETECTED Final   A.calcoaceticus-baumannii NOT DETECTED NOT DETECTED Final   Bacteroides fragilis NOT DETECTED NOT DETECTED Final   Enterobacterales DETECTED (A) NOT DETECTED Final    Comment: Enterobacterales represent a large order of gram negative bacteria, not a single organism. RESULT CALLED TO, READ BACK BY AND VERIFIED WITH: B,MANCHERIL @1721  08/05/20 EB    Enterobacter cloacae complex NOT DETECTED NOT DETECTED Final   Escherichia coli DETECTED (A) NOT DETECTED Final    Comment: RESULT CALLED TO,  READ BACK BY AND VERIFIED WITH: B,MANCHERIL @1721  08/05/20 EB    Klebsiella aerogenes NOT DETECTED NOT DETECTED Final   Klebsiella oxytoca NOT DETECTED NOT DETECTED Final   Klebsiella pneumoniae NOT DETECTED NOT DETECTED Final   Proteus species NOT DETECTED NOT DETECTED Final   Salmonella species NOT DETECTED NOT DETECTED Final   Serratia marcescens NOT DETECTED NOT DETECTED Final   Haemophilus influenzae NOT DETECTED NOT DETECTED Final   Neisseria meningitidis NOT DETECTED NOT DETECTED Final   Pseudomonas aeruginosa NOT DETECTED NOT DETECTED Final   Stenotrophomonas maltophilia NOT DETECTED NOT DETECTED Final   Candida albicans NOT DETECTED NOT DETECTED Final   Candida auris NOT DETECTED NOT DETECTED Final   Candida glabrata NOT DETECTED NOT DETECTED Final   Candida krusei NOT DETECTED NOT DETECTED Final   Candida parapsilosis NOT DETECTED NOT  DETECTED Final   Candida tropicalis NOT DETECTED NOT DETECTED Final   Cryptococcus neoformans/gattii NOT DETECTED NOT DETECTED Final   CTX-M ESBL NOT DETECTED NOT DETECTED Final   Carbapenem resistance IMP NOT DETECTED NOT DETECTED Final   Carbapenem resistance KPC NOT DETECTED NOT DETECTED Final   Carbapenem resistance NDM NOT DETECTED NOT DETECTED Final   Carbapenem resist OXA 48 LIKE NOT DETECTED NOT DETECTED Final   Carbapenem resistance VIM NOT DETECTED NOT DETECTED Final    Comment: Performed at [facility], [address] [place], [place] [address]  Urine culture     Status: Abnormal   Collection Time: 08/05/20  2:40 AM   Specimen: In/Out Cath Urine  Result Value Ref Range Status   Specimen Description IN/OUT CATH URINE  Final   Special Requests   Final    NONE Performed at [facility], [place] [address] [place], [place] [address]    Culture >=100,000 COLONIES/mL ESCHERICHIA COLI (A)  Final   Report Status 08/06/2020 FINAL  Final   Organism ID, Bacteria ESCHERICHIA COLI (A)  Final      Susceptibility   Escherichia coli - MIC*    AMPICILLIN >=32 RESISTANT Resistant     CEFAZOLIN <=4 SENSITIVE Sensitive     CEFTRIAXONE <=0.25 SENSITIVE Sensitive     CIPROFLOXACIN >=4 RESISTANT Resistant     GENTAMICIN >=16 RESISTANT Resistant     IMIPENEM <=0.25 SENSITIVE Sensitive     NITROFURANTOIN 32 SENSITIVE Sensitive     TRIMETH/SULFA >=320 RESISTANT Resistant     AMPICILLIN/SULBACTAM 8 SENSITIVE Sensitive     PIP/TAZO <=4 SENSITIVE Sensitive     * >=100,000 COLONIES/mL ESCHERICHIA COLI  SARS Coronavirus 2 by RT PCR (hospital order, performed in [facility] hospital lab) Nasopharyngeal Nasopharyngeal Swab     Status: None   Collection Time: 08/05/20  4:34 AM   Specimen: Nasopharyngeal Swab  Result Value Ref Range Status   SARS Coronavirus 2 NEGATIVE NEGATIVE Final    Comment: (NOTE) SARS-CoV-2 target nucleic acids are NOT DETECTED.  The SARS-CoV-2 RNA is generally  detectable in upper and lower respiratory specimens during the acute phase of infection. The lowest concentration of SARS-CoV-2 viral copies this assay can detect is 250 copies / mL. A negative result does not preclude SARS-CoV-2 infection and should not be used as the sole basis for treatment or other patient management decisions.  A negative result may occur with improper specimen collection / handling, submission of specimen other than nasopharyngeal swab, presence of viral mutation(s) within the areas targeted by this assay, and inadequate number of viral copies (<250 copies / mL). A negative result  must be combined with clinical observations, patient history, and epidemiological information.  Fact Sheet for Patients:   StrictlyIdeas.no  Fact Sheet for Healthcare Providers: BankingDealers.co.za  This test is not yet approved or  cleared by the [place] FDA and has been authorized for detection and/or diagnosis of SARS-CoV-2 by FDA under an Emergency Use Authorization (EUA).  This EUA will remain in effect (meaning this test can be used) for the duration of the COVID-19 declaration under Section 564(b)(1) of the Act, 21 U.S.C. section 360bbb-3(b)(1), unless the authorization is terminated or revoked sooner.  Performed at [facility], [place] [address], [place], [place] [address]   SARS Coronavirus 2 by RT PCR (hospital order, performed in [facility] hospital lab) Nasopharyngeal Nasopharyngeal Swab     Status: None   Collection Time: 08/10/20  5:01 AM   Specimen: Nasopharyngeal Swab  Result Value Ref Range Status   SARS Coronavirus 2 NEGATIVE NEGATIVE Final    Comment: (NOTE) SARS-CoV-2 target nucleic acids are NOT DETECTED.  The SARS-CoV-2 RNA is generally detectable in upper and lower respiratory specimens during the acute phase of infection. The lowest concentration of SARS-CoV-2 viral copies this assay can detect is  250 copies / mL. A negative result does not preclude SARS-CoV-2 infection and should not be used as the sole basis for treatment or other patient management decisions.  A negative result may occur with improper specimen collection / handling, submission of specimen other than nasopharyngeal swab, presence of viral mutation(s) within the areas targeted by this assay, and inadequate number of viral copies (<250 copies / mL). A negative result must be combined with clinical observations, patient history, and epidemiological information.  Fact Sheet for Patients:   StrictlyIdeas.no  Fact Sheet for Healthcare Providers: BankingDealers.co.za  This test is not yet approved or  cleared by the [place] FDA and has been authorized for detection and/or diagnosis of SARS-CoV-2 by FDA under an Emergency Use Authorization (EUA).  This EUA will remain in effect (meaning this test can be used) for the duration of the COVID-19 declaration under Section 564(b)(1) of the Act, 21 U.S.C. section 360bbb-3(b)(1), unless the authorization is terminated or revoked sooner.  Performed at [facility], [place] [address], [place], [place] [address]   MRSA PCR Screening     Status: None   Collection Time: 08/11/20  8:52 PM   Specimen: Nasal Mucosa; Nasopharyngeal  Result Value Ref Range Status   MRSA by PCR NEGATIVE NEGATIVE Final    Comment:        The GeneXpert MRSA Assay (FDA approved for NASAL specimens only), is one component of a comprehensive MRSA colonization surveillance program. It is not intended to diagnose MRSA infection nor to guide or monitor treatment for MRSA infections. Performed at [facility], [place] [address] [place], [place] [address]         Radiology Studies: NM GI Blood Loss  Result Date: 08/12/2020 CLINICAL DATA:  69 year old male with melena. Endoscopy demonstrated Mallory-Weiss tear and duodenal ulcer.  Evaluate for GI bleed. EXAM: NUCLEAR MEDICINE GASTROINTESTINAL BLEEDING SCAN TECHNIQUE: Sequential abdominal images were obtained following intravenous administration of Tc-78m labeled red blood cells. RADIOPHARMACEUTICALS:  23.4 mCi Tc-25m pertechnetate in-vitro labeled red cells. COMPARISON:  None. FINDINGS: There is physiologic uptake in the liver, cardiac blood pool, as well as activity in the major vasculature. No activity identified conforming to the bowel to suggest active GI bleed. IMPRESSION: No scintigraphic evidence of active GI bleed. Electronically  Signed   By: Anner Crete M.D.   On: 08/12/2020 16:18   CT Angio Abd/Pel w/ and/or w/o  Result Date: 08/14/2020 CLINICAL DATA:  69 year old male with a history GI bleeding Recent endoscopy with hemostatic clips placed at the site of a Mallory-Weiss tear, as well as hemostatic clips placed at the site of a duodenal ulcer. EXAM: CTA ABDOMEN AND PELVIS WITHOUT AND WITH CONTRAST TECHNIQUE: Multidetector CT imaging of the abdomen and pelvis was performed using the standard protocol during bolus administration of intravenous contrast. Multiplanar reconstructed images and MIPs were obtained and reviewed to evaluate the vascular anatomy. CONTRAST:  11mL OMNIPAQUE IOHEXOL 300 MG/ML  SOLN COMPARISON:  Nuclear medicine study 08/12/2020 FINDINGS: VASCULAR Aorta: No significant atherosclerotic changes of the abdominal aorta. No aneurysm. No dissection. No periaortic fluid. Celiac: Celiac artery patent. Typical branch pattern of the celiac artery. No atherosclerotic changes. SMA: SMA patent without atherosclerotic changes. Renals: - Right: Right renal artery patent. - Left: Left renal artery patent. IMA: Inferior mesenteric artery is patent. Right lower extremity: Unremarkable caliber, and contour of the right iliac system. Mild tortuosity. Mild atherosclerosis without high-grade stenosis or occlusion. No aneurysm, dissection, or occlusion. Hypogastric artery is  patent. Common femoral artery patent. Proximal SFA and profunda femoris patent. Left lower extremity: Unremarkable caliber, and contour of the left iliac system. Mild tortuosity. Mild atherosclerosis. No high-grade stenosis or occlusion. No aneurysm, dissection, or occlusion. Hypogastric artery is patent. Common femoral artery patent. Proximal SFA and profunda femoris patent. Veins: Unremarkable appearance of the venous system. Review of the MIP images confirms the above findings. NON-VASCULAR Lower chest: Atelectasis at the bilateral lung bases. Trace left-sided pleural effusion. Hepatobiliary: Nonenhancing cyst of the caudate lobe, 24 mm. This was present on the CT dated 05/09/2017, unchanged and is most compatible with a benign biliary cyst. Cystic lesion within segment 6, 2.8 cm. This was present on the prior CT and is unchanged, most compatible with benign biliary cyst. Gallbladder is decompressed. No inflammatory changes. Pancreas: Unremarkable. Spleen: Unremarkable. Adrenals/Urinary Tract: - Right adrenal gland: Unremarkable - Left adrenal gland: Unremarkable. - Right kidney: Nonobstructive nephrolithiasis in the right kidney with 2 small stones measuring 2 mm. Nonenhancing/non complex cystic lesions of the right kidney most compatible with benign cysts. - Left Kidney: No hydronephrosis. Nonobstructing stones in the left collecting system, approximately 4. The largest measures 7 mm. Nonenhancing/non complex cystic lesions of the left kidney, the majority of which are too small to characterize. The largest on the medial cortex is compatible with benign Bosniak 1 cyst. - Urinary Bladder: Stones within the urinary bladder. No inflammatory changes. Stomach/Bowel: - Stomach: Metallic clips at the GE junction compatible with given history. Small dense contrast within the lumen of the stomach. No evidence of active extravasation. - Small bowel: Metallic clips in the duodenum, compatible with the given history. No  extravasation of contrast or accumulation to suggest hemorrhage. No significant inflammatory changes. Small bowel relatively decompressed. No air-fluid levels. No transition point. - Appendix: Normal. - Colon: Migrated metallic clip in the cecum. Small volume formed stool within the colon. No air-fluid levels. No accumulation of contrast. No focal inflammatory changes. Diverticular disease of the left colon. No evidence of active extravasation. Lymphatic: No adenopathy. Mesenteric: Trace fluid within the left pelvis, likely reactive. Reproductive: Prostate measures 5.7 cm Other: Edema within the body wall. Musculoskeletal: Multilevel degenerative changes of the visualized spine. No bony canal narrowing. No acute displaced fracture. Degenerative changes of the hips. IMPRESSION: CT angiogram is  negative for gastrointestinal hemorrhage. Treatment changes related to recent endoscopy/clipping at the GE junction and in the first portion of the duodenum. There is a migrated metallic clip within the cecum. Mild body wall edema/anasarca and trace left pleural effusion. Bilateral nonobstructive nephrolithiasis. There is also small bladder stones at the bladder base. Additional ancillary findings as above. Signed, Dulcy Fanny. Dellia Nims, RPVI [facility] Electronically Signed   By: Corrie Mckusick D.O.   On: 08/14/2020 10:58        Scheduled Meds: . sodium chloride   Intravenous Once  . sodium chloride   Intravenous Once  . amiodarone  200 mg Oral BID   Followed by  . [START ON 08/20/2020] amiodarone  200 mg Oral Daily  . atorvastatin  20 mg Oral Daily  . Chlorhexidine Gluconate Cloth  6 each Topical Daily  . docusate sodium  100 mg Oral BID  . pantoprazole  40 mg Intravenous Q12H  . polyethylene glycol  17 g Oral Daily  . sucralfate  1 g Oral TID WC & HS  . tamsulosin  0.4 mg Oral QPC supper   Continuous Infusions: . cefTRIAXone (ROCEPHIN)  IV 2 g  (08/14/20 0958)     LOS: 4 days     Cordelia Poche, MD [facility] 08/14/2020, 2:00 PM  If 7PM-7AM, please contact night-coverage [email or web address]

## 2020-08-14 NOTE — Progress Notes (Signed)
PT Cancellation Note  Patient Details Name: AUBURN HERT MRN: [id number] DOB: 01/31/1951   Cancelled Treatment:    Reason Eval/Treat Not Completed: Patient declined, no reason specified patient eating lunch, politely declines working with PT at this time. Will attempt to return if time/schedule allow.    Windell Norfolk, DPT, PN1   Supplemental Physical Therapist [facility]    Pager [phone] Acute Rehab Office [phone]

## 2020-08-15 ENCOUNTER — Inpatient Hospital Stay (HOSPITAL_COMMUNITY): Payer: Medicare HMO | Admitting: Certified Registered Nurse Anesthetist

## 2020-08-15 ENCOUNTER — Encounter (HOSPITAL_COMMUNITY): Payer: Self-pay | Admitting: Internal Medicine

## 2020-08-15 ENCOUNTER — Encounter (HOSPITAL_COMMUNITY)
Admission: EM | Disposition: A | Discharge status: Home / Self Care | Payer: Self-pay | Source: Home / Self Care | Attending: Family Medicine

## 2020-08-15 HISTORY — PX: ESOPHAGOGASTRODUODENOSCOPY (EGD) WITH PROPOFOL: SHX5813

## 2020-08-15 LAB — BASIC METABOLIC PANEL
Anion gap: 7 (ref 5–15)
BUN: 18 mg/dL (ref 8–23)
CO2: 26 mmol/L (ref 22–32)
Calcium: 8 mg/dL — ABNORMAL LOW (ref 8.9–10.3)
Chloride: 107 mmol/L (ref 98–111)
Creatinine, Ser: 0.93 mg/dL (ref 0.61–1.24)
GFR calc Af Amer: >60 mL/min (ref >60)
GFR calc non Af Amer: >60 mL/min (ref >60)
Glucose, Bld: 107 mg/dL — ABNORMAL HIGH (ref 70–99)
Potassium: 3.6 mmol/L (ref 3.5–5.1)
Sodium: 140 mmol/L (ref 135–145)

## 2020-08-15 LAB — HEMOGLOBIN AND HEMATOCRIT, BLOOD
HCT: 23.7 % — ABNORMAL LOW (ref 39.0–52.0)
Hemoglobin: 7.5 g/dL — ABNORMAL LOW (ref 13.0–17.0)

## 2020-08-15 SURGERY — ESOPHAGOGASTRODUODENOSCOPY (EGD) WITH PROPOFOL
Anesthesia: Monitor Anesthesia Care

## 2020-08-15 MED ORDER — PANTOPRAZOLE SODIUM 40 MG PO TBEC: 40.0000 mg | DELAYED_RELEASE_TABLET | Freq: Every day | ORAL | 0 refills | Status: AC

## 2020-08-15 MED ORDER — PROPOFOL 10 MG/ML IV BOLUS
INTRAVENOUS | Status: DC | PRN
  Administered 2020-08-15 (×3): 20 mg via INTRAVENOUS

## 2020-08-15 MED ORDER — PHENYLEPHRINE 40 MCG/ML (10ML) SYRINGE FOR IV PUSH (FOR BLOOD PRESSURE SUPPORT)
PREFILLED_SYRINGE | INTRAVENOUS | Status: DC | PRN
  Administered 2020-08-15: 80 ug via INTRAVENOUS

## 2020-08-15 MED ORDER — PROPOFOL 500 MG/50ML IV EMUL
INTRAVENOUS | Status: DC | PRN
  Administered 2020-08-15: 100 ug/kg/min via INTRAVENOUS

## 2020-08-15 MED ORDER — AMIODARONE HCL 200 MG PO TABS: ORAL_TABLET | ORAL | 0 refills | Status: DC

## 2020-08-15 MED ORDER — LACTATED RINGERS IV SOLN: INTRAVENOUS | Status: DC | PRN

## 2020-08-15 SURGICAL SUPPLY — 15 items

## 2020-08-15 NOTE — Evaluation (Signed)
Physical Therapy Evaluation Patient Details Name: David Vargas MRN: [id number] DOB: [birth year] Today's Date: 08/15/2020   History of Present Illness  Pt is a 69 y.o. M with significant PMH of PAF who was admitted 08/10/2020 for hemorrhagic shock secondary to [place] tear. Treated with Endo Clip placement and IV epinephrine injection.  Clinical Impression  Pt frustrated upon PT arrival because he was supposed to already have gone to endoscopy, listened to pt's concerns, called endo after session and relayed to pt that they were on their way to get him. RN updated as well. Pt mobilizing independently to EOB and with sit>stand. Ambulated 8' with RW and supervision. Tends to maintain trunk in flexion with use of RW. Took RW away last few feet and posture was better but pt mildly unsteady. Performed postural exercises in standing. Pt's mobility is progressing well. PT will continue to follow.     Follow Up Recommendations No PT follow up;Supervision for mobility/OOB    Equipment Recommendations  None recommended by PT    Recommendations for Other Services       Precautions / Restrictions Precautions Precautions: Fall Restrictions Weight Bearing Restrictions: No      Mobility  Bed Mobility Overal bed mobility: Modified Independent Bed Mobility: Supine to Sit;Sit to Supine     Supine to sit: Modified independent (Device/Increase time) Sit to supine: Modified independent (Device/Increase time)   General bed mobility comments: in and out of bed independently  Transfers Overall transfer level: Needs assistance Equipment used: None Transfers: Sit to/from Stand Sit to Stand: From elevated surface;Supervision         General transfer comment: supervision with sit to stand, vc's to not use RW for standing  Ambulation/Gait Ambulation/Gait assistance: Supervision Gait Distance (Feet): 340 Feet Assistive device: Rolling walker (2 wheeled);None Gait Pattern/deviations:  Step-through pattern;Decreased stride length;Trunk flexed Gait velocity: decreased Gait velocity interpretation: 1.31 - 2.62 ft/sec, indicative of limited community ambulator General Gait Details: frequent cues for upright posture, after several, pt self corrected. Last 6' of ambulation without AD and pt stood straighter but noted mild unsteadiness.   Stairs            Wheelchair Mobility    Modified Rankin (Stroke Patients Only)       Balance Overall balance assessment: Needs assistance Sitting-balance support: Feet supported Sitting balance-Leahy Scale: Good     Standing balance support: During functional activity;No upper extremity supported Standing balance-Leahy Scale: Fair Standing balance comment: able to maintain static stance without UE support                             Pertinent Vitals/Pain Pain Assessment: Faces Faces Pain Scale: Hurts little more Pain Location: stiffness in knees Pain Descriptors / Indicators: Sore Pain Intervention(s): Limited activity within patient's tolerance;Monitored during session    Home Living                        Prior Function                 Hand Dominance        Extremity/Trunk Assessment                Communication      Cognition Arousal/Alertness: Awake/alert Behavior During Therapy: WFL for tasks assessed/performed Overall Cognitive Status: Within Functional Limits for tasks assessed  General Comments General comments (skin integrity, edema, etc.): SpO2 upper 90's, HR 91 bpm    Exercises General Exercises - Lower Extremity Ankle Circles/Pumps: AROM;Both;Seated;10 reps Long Arc Quad: AROM;Both;10 reps;Seated Other Exercises Other Exercises: scapular retraction x10 Other Exercises: shoulder shrugs x10   Assessment/Plan    PT Assessment    PT Problem List         PT Treatment Interventions      PT Goals  (Current goals can be found in the Care Plan section)  Acute Rehab PT Goals Patient Stated Goal: resolve Hgb issues PT Goal Formulation: With patient Time For Goal Achievement: 08/27/20 Potential to Achieve Goals: Good    Frequency Min 3X/week   Barriers to discharge        Co-evaluation               AM-PAC PT "6 Clicks" Mobility  Outcome Measure Help needed turning from your back to your side while in a flat bed without using bedrails?: None Help needed moving from lying on your back to sitting on the side of a flat bed without using bedrails?: A Little Help needed moving to and from a bed to a chair (including a wheelchair)?: A Little Help needed standing up from a chair using your arms (e.g., wheelchair or bedside chair)?: A Little Help needed to walk in hospital room?: A Little Help needed climbing 3-5 steps with a railing? : A Little 6 Click Score: 19    End of Session Equipment Utilized During Treatment: Gait belt Activity Tolerance: Patient tolerated treatment well Patient left: in bed;with call bell/phone within reach Nurse Communication: Mobility status PT Visit Diagnosis: Unsteadiness on feet (R26.81);Difficulty in walking, not elsewhere classified (R26.2)    Time: 3704-8889 PT Time Calculation (min) (ACUTE ONLY): 24 min   Charges:     PT Treatments $Gait Training: 23-37 mins        [place]  Pager [phone] Office [place] 08/15/2020, 1:48 PM

## 2020-08-15 NOTE — Interval H&P Note (Signed)
History and Physical Interval Note:  08/15/2020 1:16 PM  David Vargas  has presented today for surgery, with the diagnosis of anemia.  The various methods of treatment have been discussed with the patient. After consideration of risks, benefits and other options for treatment, the patient has consented to  Procedure(s): ESOPHAGOGASTRODUODENOSCOPY (EGD) WITH PROPOFOL (N/A) as a surgical intervention.  The patient's history has been reviewed, patient examined, no change in status, stable for surgery.  I have reviewed the patient's chart and labs.  Questions were answered to the patient's satisfaction.     [name]

## 2020-08-15 NOTE — Discharge Summary (Signed)
Physician Discharge Summary  David Vargas IWL:[id number] DOB: 1951-02-11 DOA: 08/10/2020  PCP: Hulan Fess, MD  Admit date: 08/10/2020 Discharge date: 08/15/2020  Admitted From: Home Disposition: Home  Recommendations for Outpatient Follow-up:  1. Follow up with PCP in 1 week 2. Please obtain BMP/CBC in one week 3. Please follow up on the following pending results: None  Home Health: None Equipment/Devices: None  Discharge Condition: Stable CODE STATUS: Full code Diet recommendation: Heart healthy   Brief/Interim Summary:  Admission HPI written by Gean Birchwood, MD   Chief Complaint: Throwing up blood.  HPI: David Vargas is a 69 y.o. male with history of paroxysmal atrial fibrillation not on anticoagulation recently admitted for E. coli bacteremia after prostate biopsy discharged 3 days ago was brought to the ER after patient had at least 4 episodes of coffee-ground vomitus at home.  Patient states earlier in the day felt weak and dizzy he almost passed out and was lying on the bed.  Later he had some soup and feels lately started throwing up black/coffee-ground vomitus.  Denies noticing any black stools.  Denies taking any NSAIDs.  Patient is not on any anticoagulation or antiplatelet agents.  ED Course: In the ER patient was hypotensive tachycardic A. fib with RVR.  Patient was attempted cardioversion 3 times but failed.  Cardiology at this time feels patient's heart rate is driven by the hypovolemia and anemia and advised to give fluids and blood transfusion.  Patient's hemoglobin has dropped a 5 g from 14-9.  Patient is receiving 2 units of PRBC transfusion after fluid bolus and Protonix infusion has been started.  Covid test is still pending.    Hospital course:  Hematemesis Acute UGI bleeding GI consulted. Patient underwent EGD on 8/17 which was significant for deep Mallory-Weiss tear which was treated with 2 Endo Clip placement in addition to a duodenal  ulcer with visible vessel treated with epinephrine injection and 2 Endo Clip placement. Mildly elevated H. Pylori IgG ab result (equivocoal). Patient with repeat EGD on 8/21 which was not significant for active bleeding. Recommendations for discharge on Protonix daily. Also will need to follow-up with GI with regard to equivocal H. Pylori ab test (discussed with GI prior to discharge)  Acute blood loss anemia Secondary to above. Patient had received a total of 7 units of PRBC to date. Hemoglobin has dropped again to 5.7. patient given an additional 2 units of PRBC with rebound hemoglobin of 7.4. GI re-consulted and ordered CTA abdomen/pelvis which shows migrated mechanical clip. Hemoglobin stable prior to discharge.  Hypovolemic shock Hemorrhagic shock Hypotension Documented but appears patient did not meet criteria as he was not started on vasopressor support. Managed with fluid resuscitation. Secondary to above. Resolved.  Paroxysmal atrial fibrillation with RVR Cardiology consulted. Secondary to acute blood loss anemia. Patient underwent DC cardioversion x3 in the ED without successful conversion to sinus rhythm. Patient managed on IV amiodarone while in the ICU and has been transitioned to oral amiodarone. Cardiology recommendations for discharge include Amiodarone (200 mg BID x7 days, then 200 mg x21 days, then discontinue), no anticoagulation secondary to CHADSVASC of 1. Will need follow-up with primary cardiologist, Dr. Radford Pax  AKI Baseline creatinine of 0.9-1. Peak creatinine of 1.3 and has now returned to baseline  GERD Continue Protonix and pepcid  Hyperlipidemia Continue Lipitor  History of E. Coli Bacteremia Recent prostate biopsy Urology on board. Ceftriaxone used while inpatient. Continue tamsulosin. Resume home Keflex with last dose on 8/23.  Discharge Diagnoses:  Principal Problem:   Acute GI bleeding Active Problems:   Paroxysmal atrial fibrillation with rapid  ventricular response (HCC)   Atrial fibrillation with RVR (HCC)   Acute blood loss anemia   GI bleed    Discharge Instructions   Allergies as of 08/15/2020   No Known Allergies     Medication List    TAKE these medications   acetaminophen 325 MG tablet Commonly known as: TYLENOL Take 650 mg by mouth every 6 (six) hours as needed for mild pain or fever.   amiodarone 200 MG tablet Commonly known as: PACERONE Take 1 tablet (200 mg total) by mouth 2 (two) times daily for 5 days, THEN 1 tablet (200 mg total) daily for 14 days. Start taking on: August 15, 2020   atorvastatin 20 MG tablet Commonly known as: LIPITOR Take 1 tablet (20 mg total) by mouth at bedtime.   cephALEXin 500 MG capsule Commonly known as: KEFLEX Take 1 capsule (500 mg total) by mouth 4 (four) times daily for 10 days.   famotidine 20 MG tablet Commonly known as: PEPCID Take 20 mg by mouth daily.   melatonin 5 MG Tabs Take 5 mg by mouth at bedtime as needed (for sleep).   pantoprazole 40 MG tablet Commonly known as: Protonix Take 1 tablet (40 mg total) by mouth daily.   tadalafil 10 MG tablet Commonly known as: CIALIS Take 5 mg by mouth daily as needed for erectile dysfunction.   tamsulosin 0.4 MG Caps capsule Commonly known as: FLOMAX Take 1 capsule (0.4 mg total) by mouth daily after supper.       Follow-up Information    Imogene Burn, PA-C Follow up on 09/23/2020.   Specialty: Cardiology Why: @ 7:45AM Contact information: [address] [place] [address] [phone]              No Known Allergies  Consultations:  PCCM  Gastroenterology  Urology  Cardiology   Procedures/Studies: NM GI Blood Loss  Result Date: 08/12/2020 CLINICAL DATA:  69 year old male with melena. Endoscopy demonstrated Mallory-Weiss tear and duodenal ulcer. Evaluate for GI bleed. EXAM: NUCLEAR MEDICINE GASTROINTESTINAL BLEEDING SCAN TECHNIQUE: Sequential abdominal images were  obtained following intravenous administration of Tc-27m labeled red blood cells. RADIOPHARMACEUTICALS:  23.4 mCi Tc-9m pertechnetate in-vitro labeled red cells. COMPARISON:  None. FINDINGS: There is physiologic uptake in the liver, cardiac blood pool, as well as activity in the major vasculature. No activity identified conforming to the bowel to suggest active GI bleed. IMPRESSION: No scintigraphic evidence of active GI bleed. Electronically Signed   By: Anner Crete M.D.   On: 08/12/2020 16:18   CT CARDIAC SCORING  Addendum Date: 07/28/2020   ADDENDUM REPORT: 07/28/2020 15:01 CLINICAL DATA:  Risk stratification EXAM: Coronary Calcium Score TECHNIQUE: The patient was scanned on a [employer]. Axial non-contrast 3 mm slices were carried out through the heart. The data set was analyzed on a dedicated work station and scored using the [place]. FINDINGS: Non-cardiac: See separate report from [facility]. Ascending Aorta: Mildly dilated ascending aorta at 4cm. Scattered calcifications. Pericardium: Normal Coronary arteries: Normal coronary origins. IMPRESSION: Coronary calcium score of 105. This was 44th percentile for age and sex matched control. Fransico Him Electronically Signed   By: Fransico Him   On: 07/28/2020 15:01   Result Date: 07/28/2020 EXAM: OVER-READ INTERPRETATION  CT CHEST The following report is an over-read performed by radiologist Dr. Rolm Baptise of [facility]  on 07/28/2020. This over-read does not include interpretation of cardiac or coronary anatomy or pathology. The coronary calcium score interpretation by the cardiologist is attached. COMPARISON:  07/08/2020 FINDINGS: Vascular: Slight aneurysmal dilatation of the ascending thoracic aorta, 4 cm maximally. Heart is normal size. Mediastinum/Nodes: No adenopathy. Calcified bilateral hilar and mediastinal lymph nodes. Lungs/Pleura: Several small pulmonary nodules throughout the lungs bilaterally, stable  since prior study. No effusions. Upper Abdomen: No acute findings in the upper abdomen Musculoskeletal: Chest wall soft tissues are unremarkable. No acute bony abnormality. IMPRESSION: Several small bilateral pulmonary nodules, unchanged since prior study and dating back to 2019. No acute extra cardiac abnormality. 4 cm ascending thoracic aortic aneurysm. Recommend annual imaging followup by CTA or MRA. This recommendation follows 2010 ACCF/AHA/AATS/ACR/ASA/SCA/SCAI/SIR/STS/SVM Guidelines for the Diagnosis and Management of Patients with Thoracic Aortic Disease. Circulation. 2010; 121: K539-J673. Aortic aneurysm NOS (ICD10-I71.9) Old granulomatous disease. Electronically Signed: By: Rolm Baptise M.D. On: 07/28/2020 09:43   DG Chest Port 1 View  Result Date: 08/12/2020 CLINICAL DATA:  Abnormal respirations EXAM: PORTABLE CHEST 1 VIEW COMPARISON:  Radiograph 08/05/2020, CT 07/28/2020, 07/08/2020 FINDINGS: Multitude of basilar pulmonary nodules are again seen, better visualized on comparison cross-sectional imaging. Some reticular opacities towards the lung bases are similar to comparison studies as well and may reflect chronic interstitial changes. No consolidation, features of edema, pneumothorax, or effusion. The aorta is calcified. The remaining cardiomediastinal contours are unremarkable. No acute osseous or soft tissue abnormality. Degenerative changes are present in the imaged spine and shoulders. Telemetry leads overlie the chest. Linear radiodensity near the GE junction could reflect a surgical clip or button lead. IMPRESSION: 1. No acute cardiopulmonary abnormality. 2. Bibasilar pulmonary nodules are again seen, better visualized on comparison cross-sectional imaging. 3. Reticular opacities towards the lung bases are similar to comparison studies and may reflect chronic interstitial changes. 4.  Aortic Atherosclerosis (ICD10-I70.0). Electronically Signed   By: Lovena Le M.D.   On: 08/12/2020 06:38    DG Chest Port 1 View  Result Date: 08/05/2020 CLINICAL DATA:  Vomiting and tremors EXAM: PORTABLE CHEST 1 VIEW COMPARISON:  None. FINDINGS: The heart size and mediastinal contours are within normal limits. Both lungs are clear. The visualized skeletal structures are unremarkable. IMPRESSION: No active disease. Electronically Signed   By: Prudencio Pair M.D.   On: 08/05/2020 02:25   CT Angio Abd/Pel w/ and/or w/o  Result Date: 08/14/2020 CLINICAL DATA:  69 year old male with a history GI bleeding Recent endoscopy with hemostatic clips placed at the site of a Mallory-Weiss tear, as well as hemostatic clips placed at the site of a duodenal ulcer. EXAM: CTA ABDOMEN AND PELVIS WITHOUT AND WITH CONTRAST TECHNIQUE: Multidetector CT imaging of the abdomen and pelvis was performed using the standard protocol during bolus administration of intravenous contrast. Multiplanar reconstructed images and MIPs were obtained and reviewed to evaluate the vascular anatomy. CONTRAST:  134mL OMNIPAQUE IOHEXOL 300 MG/ML  SOLN COMPARISON:  Nuclear medicine study 08/12/2020 FINDINGS: VASCULAR Aorta: No significant atherosclerotic changes of the abdominal aorta. No aneurysm. No dissection. No periaortic fluid. Celiac: Celiac artery patent. Typical branch pattern of the celiac artery. No atherosclerotic changes. SMA: SMA patent without atherosclerotic changes. Renals: - Right: Right renal artery patent. - Left: Left renal artery patent. IMA: Inferior mesenteric artery is patent. Right lower extremity: Unremarkable caliber, and contour of the right iliac system. Mild tortuosity. Mild atherosclerosis without high-grade stenosis or occlusion. No aneurysm, dissection, or occlusion. Hypogastric artery is patent. Common femoral artery patent. Proximal  SFA and profunda femoris patent. Left lower extremity: Unremarkable caliber, and contour of the left iliac system. Mild tortuosity. Mild atherosclerosis. No high-grade stenosis or occlusion. No  aneurysm, dissection, or occlusion. Hypogastric artery is patent. Common femoral artery patent. Proximal SFA and profunda femoris patent. Veins: Unremarkable appearance of the venous system. Review of the MIP images confirms the above findings. NON-VASCULAR Lower chest: Atelectasis at the bilateral lung bases. Trace left-sided pleural effusion. Hepatobiliary: Nonenhancing cyst of the caudate lobe, 24 mm. This was present on the CT dated 05/09/2017, unchanged and is most compatible with a benign biliary cyst. Cystic lesion within segment 6, 2.8 cm. This was present on the prior CT and is unchanged, most compatible with benign biliary cyst. Gallbladder is decompressed. No inflammatory changes. Pancreas: Unremarkable. Spleen: Unremarkable. Adrenals/Urinary Tract: - Right adrenal gland: Unremarkable - Left adrenal gland: Unremarkable. - Right kidney: Nonobstructive nephrolithiasis in the right kidney with 2 small stones measuring 2 mm. Nonenhancing/non complex cystic lesions of the right kidney most compatible with benign cysts. - Left Kidney: No hydronephrosis. Nonobstructing stones in the left collecting system, approximately 4. The largest measures 7 mm. Nonenhancing/non complex cystic lesions of the left kidney, the majority of which are too small to characterize. The largest on the medial cortex is compatible with benign Bosniak 1 cyst. - Urinary Bladder: Stones within the urinary bladder. No inflammatory changes. Stomach/Bowel: - Stomach: Metallic clips at the GE junction compatible with given history. Small dense contrast within the lumen of the stomach. No evidence of active extravasation. - Small bowel: Metallic clips in the duodenum, compatible with the given history. No extravasation of contrast or accumulation to suggest hemorrhage. No significant inflammatory changes. Small bowel relatively decompressed. No air-fluid levels. No transition point. - Appendix: Normal. - Colon: Migrated metallic clip in the  cecum. Small volume formed stool within the colon. No air-fluid levels. No accumulation of contrast. No focal inflammatory changes. Diverticular disease of the left colon. No evidence of active extravasation. Lymphatic: No adenopathy. Mesenteric: Trace fluid within the left pelvis, likely reactive. Reproductive: Prostate measures 5.7 cm Other: Edema within the body wall. Musculoskeletal: Multilevel degenerative changes of the visualized spine. No bony canal narrowing. No acute displaced fracture. Degenerative changes of the hips. IMPRESSION: CT angiogram is negative for gastrointestinal hemorrhage. Treatment changes related to recent endoscopy/clipping at the GE junction and in the first portion of the duodenum. There is a migrated metallic clip within the cecum. Mild body wall edema/anasarca and trace left pleural effusion. Bilateral nonobstructive nephrolithiasis. There is also small bladder stones at the bladder base. Additional ancillary findings as above. Signed, Dulcy Fanny. Dellia Nims, RPVI [facility] Electronically Signed   By: Corrie Mckusick D.O.   On: 08/14/2020 10:58      UPPER GI ENDOSCOPY (08/11/2020) Impression: - Normal esophagus. - Z-line regular, 35 cm from the incisors. - Mallory-Weiss tear. Clips (MR conditional) were  placed. - Normal gastric fundus, gastric body, incisura,  antrum and pylorus. - Non-bleeding duodenal ulcer with a visible  vessel. Injected. Clips (MR conditional) were  placed. - No specimens collected.  Recommendation: - Clear liquid diet. - Continue present medications, protonix IV drip  for another 24  hours. - Monitor H and H and transfuse to keep Hb above 7.   UPPER GI ENDOSCOPY (08/15/2020) Impression:               - No active bleeding or blood in the stomach at the  time of this procedure.                           - Grossly normal larynx.                           - Non-obstructing mild Schatzki ring.                           - 3 cm hiatal hernia with linear erosion with clip,                            which became dislodged during procedure, consistent                            with healing Mallory Weiss tear.                           - Non-bleeding duodenal ulcer with clip still                            present from 4 days ago.                           - No specimens collected.                           - Patient appears to be at low risk for re-bleeding. Recommendation:           - Continue present medications. Advance diet.                            Discharge soon (today or tomorrow).  Subjective: Melena overnight. No other concerns.  Discharge Exam: Vitals:   08/15/20 1350 08/15/20 1400  BP: 112/64 120/75  Pulse: 74 77  Resp: 15 16  Temp:    SpO2: 100% 99%   Vitals:   08/15/20 1330 08/15/20 1340 08/15/20 1350 08/15/20 1400  BP: 98/64 96/64 112/64 120/75  Pulse: 84 80 74 77  Resp: 15 13 15 16   Temp: 97.8 F (36.6 C)     TempSrc: Oral     SpO2: 100% 100% 100% 99%  Weight:      Height:        General: Pt is alert, awake, not in acute distress Cardiovascular: RRR, S1/S2 +, no rubs, no gallops Respiratory: CTA bilaterally, no wheezing, no rhonchi Abdominal: Soft, NT, ND, bowel sounds + Extremities: no edema, no cyanosis    The results of significant diagnostics from this hospitalization (including imaging, microbiology, ancillary and laboratory) are listed below for reference.     Microbiology: Recent Results (from the past 240 hour(s))  SARS Coronavirus 2 by RT PCR (hospital order, performed  in [facility] hospital lab) Nasopharyngeal Nasopharyngeal Swab     Status: None   Collection Time: 08/10/20  5:01 AM   Specimen: Nasopharyngeal Swab  Result Value Ref Range Status   SARS Coronavirus 2 NEGATIVE NEGATIVE Final    Comment: (NOTE) SARS-CoV-2 target nucleic acids are NOT DETECTED.  The SARS-CoV-2 RNA is generally detectable in upper and lower respiratory specimens during the acute  phase of infection. The lowest concentration of SARS-CoV-2 viral copies this assay can detect is 250 copies / mL. A negative result does not preclude SARS-CoV-2 infection and should not be used as the sole basis for treatment or other patient management decisions.  A negative result may occur with improper specimen collection / handling, submission of specimen other than nasopharyngeal swab, presence of viral mutation(s) within the areas targeted by this assay, and inadequate number of viral copies (<250 copies / mL). A negative result must be combined with clinical observations, patient history, and epidemiological information.  Fact Sheet for Patients:   StrictlyIdeas.no  Fact Sheet for Healthcare Providers: BankingDealers.co.za  This test is not yet approved or  cleared by the [place] FDA and has been authorized for detection and/or diagnosis of SARS-CoV-2 by FDA under an Emergency Use Authorization (EUA).  This EUA will remain in effect (meaning this test can be used) for the duration of the COVID-19 declaration under Section 564(b)(1) of the Act, 21 U.S.C. section 360bbb-3(b)(1), unless the authorization is terminated or revoked sooner.  Performed at [facility], [place] [address], [place], [place] [address]   MRSA PCR Screening     Status: None   Collection Time: 08/11/20  8:52 PM   Specimen: Nasal Mucosa; Nasopharyngeal  Result Value Ref Range Status   MRSA by PCR NEGATIVE NEGATIVE Final    Comment:        The GeneXpert  MRSA Assay (FDA approved for NASAL specimens only), is one component of a comprehensive MRSA colonization surveillance program. It is not intended to diagnose MRSA infection nor to guide or monitor treatment for MRSA infections. Performed at [facility], [place] [address], [place], [place] [address]      Labs: BNP (last 3 results) No results for input(s): BNP in the last 8760 hours. Basic Metabolic Panel: Recent Labs  Lab 08/11/20 0441 08/12/20 0744 08/13/20 1000 08/14/20 0845 08/15/20 0724  NA 141 141 135 140 140  K 4.4 3.8 3.4* 3.3* 3.6  CL 115* 113* 107 108 107  CO2 18* 19* 20* 25 26  GLUCOSE 152* 118* 112* 110* 107*  BUN 80* 80* 41* 25* 18  CREATININE 1.17 1.30* 1.06 1.04 0.93  CALCIUM 7.4* 7.8* 7.7* 7.9* 8.0*  MG 2.1 2.3  --   --   --   PHOS  --  3.7  --   --   --    Liver Function Tests: No results for input(s): AST, ALT, ALKPHOS, BILITOT, PROT, ALBUMIN in the last 168 hours. No results for input(s): LIPASE, AMYLASE in the last 168 hours. No results for input(s): AMMONIA in the last 168 hours. CBC: Recent Labs  Lab 08/10/20 0039 08/10/20 1020 08/11/20 0441 08/11/20 0441 08/11/20 1639 08/11/20 2102 08/12/20 0744 08/12/20 1953 08/13/20 1314 08/13/20 2206 08/14/20 0845 08/14/20 1736 08/15/20 0724  WBC 11.3*   < > 19.1*  --  27.0*  --  23.6*  --  12.0*  --  11.9*  --   --   NEUTROABS 9.9*  --  15.7*  --   --   --   --   --   --   --   --   --   --   HGB 9.0*   < > 6.8*   < > 7.6*   < > 7.2*   < > 5.7* 6.3* 7.4* 7.7* 7.5*  HCT 28.1*   < > 21.4*   < > 24.1*   < >  22.0*   < > 18.2* 19.5* 23.0* 24.3* 23.7*  MCV 90.9   < > 92.6  --  94.5  --  89.8  --  92.4  --  92.7  --   --   PLT 171   < > 195  --  234  --  222  --  186  --  186  --   --    < > = values in this interval not displayed.   Cardiac Enzymes: No results for input(s): CKTOTAL, CKMB, CKMBINDEX, TROPONINI in the last 168 hours. BNP: Invalid input(s): POCBNP CBG: Recent Labs  Lab  08/11/20 2044  GLUCAP 147*   D-Dimer No results for input(s): DDIMER in the last 72 hours. Hgb A1c No results for input(s): HGBA1C in the last 72 hours. Lipid Profile No results for input(s): CHOL, HDL, LDLCALC, TRIG, CHOLHDL, LDLDIRECT in the last 72 hours. Thyroid function studies No results for input(s): TSH, T4TOTAL, T3FREE, THYROIDAB in the last 72 hours.  Invalid input(s): FREET3 Anemia work up No results for input(s): VITAMINB12, FOLATE, FERRITIN, TIBC, IRON, RETICCTPCT in the last 72 hours. Urinalysis    Component Value Date/Time   COLORURINE YELLOW 08/05/2020 0218   APPEARANCEUR CLOUDY (A) 08/05/2020 0218   LABSPEC 1.020 08/05/2020 0218   PHURINE 6.0 08/05/2020 0218   GLUCOSEU NEGATIVE 08/05/2020 0218   HGBUR LARGE (A) 08/05/2020 0218   BILIRUBINUR NEGATIVE 08/05/2020 0218   KETONESUR NEGATIVE 08/05/2020 0218   PROTEINUR 100 (A) 08/05/2020 0218   NITRITE POSITIVE (A) 08/05/2020 0218   LEUKOCYTESUR TRACE (A) 08/05/2020 0218   Sepsis Labs Invalid input(s): PROCALCITONIN,  WBC,  LACTICIDVEN Microbiology Recent Results (from the past 240 hour(s))  SARS Coronavirus 2 by RT PCR (hospital order, performed in [facility] hospital lab) Nasopharyngeal Nasopharyngeal Swab     Status: None   Collection Time: 08/10/20  5:01 AM   Specimen: Nasopharyngeal Swab  Result Value Ref Range Status   SARS Coronavirus 2 NEGATIVE NEGATIVE Final    Comment: (NOTE) SARS-CoV-2 target nucleic acids are NOT DETECTED.  The SARS-CoV-2 RNA is generally detectable in upper and lower respiratory specimens during the acute phase of infection. The lowest concentration of SARS-CoV-2 viral copies this assay can detect is 250 copies / mL. A negative result does not preclude SARS-CoV-2 infection and should not be used as the sole basis for treatment or other patient management decisions.  A negative result may occur with improper specimen collection / handling, submission of specimen other than  nasopharyngeal swab, presence of viral mutation(s) within the areas targeted by this assay, and inadequate number of viral copies (<250 copies / mL). A negative result must be combined with clinical observations, patient history, and epidemiological information.  Fact Sheet for Patients:   StrictlyIdeas.no  Fact Sheet for Healthcare Providers: BankingDealers.co.za  This test is not yet approved or  cleared by the [place] FDA and has been authorized for detection and/or diagnosis of SARS-CoV-2 by FDA under an Emergency Use Authorization (EUA).  This EUA will remain in effect (meaning this test can be used) for the duration of the COVID-19 declaration under Section 564(b)(1) of the Act, 21 U.S.C. section 360bbb-3(b)(1), unless the authorization is terminated or revoked sooner.  Performed at [facility], [place] [address] [place], [place] [address]   MRSA PCR Screening     Status: None   Collection Time: 08/11/20  8:52 PM   Specimen: Nasal Mucosa; Nasopharyngeal  Result Value Ref Range Status  MRSA by PCR NEGATIVE NEGATIVE Final    Comment:        The GeneXpert MRSA Assay (FDA approved for NASAL specimens only), is one component of a comprehensive MRSA colonization surveillance program. It is not intended to diagnose MRSA infection nor to guide or monitor treatment for MRSA infections. Performed at [facility], [place] [address], [place], [place] [address]      Time coordinating discharge: 35 minutes  SIGNED:   Cordelia Poche, MD [facility] 08/15/2020, 2:12 PM

## 2020-08-15 NOTE — Transfer of Care (Signed)
Immediate Anesthesia Transfer of Care Note  Patient: PHYLLIP CLAW  Procedure(s) Performed: ESOPHAGOGASTRODUODENOSCOPY (EGD) WITH PROPOFOL (N/A )  Patient Location: PACU  Anesthesia Type:MAC  Level of Consciousness: awake, alert  and oriented  Airway & Oxygen Therapy: Patient Spontanous Breathing  Post-op Assessment: Report given to RN and Post -op Vital signs reviewed and stable  Post vital signs: Reviewed and stable  Last Vitals:  Vitals Value Taken Time  BP 96/64 08/15/20 1340  Temp 36.6 C 08/15/20 1330  Pulse 76 08/15/20 1341  Resp 16 08/15/20 1341  SpO2 98 % 08/15/20 1341  Vitals shown include unvalidated device data.  Last Pain:  Vitals:   08/15/20 1340  TempSrc:   PainSc: 0-No pain      Patients Stated Pain Goal: 0 (01/75/10 2585)  Complications: No complications documented.

## 2020-08-15 NOTE — Anesthesia Preprocedure Evaluation (Addendum)
Anesthesia Evaluation  Patient identified by MRN, date of birth, ID band Patient awake    Reviewed: Allergy & Precautions, H&P , NPO status , Patient's Chart, lab work & pertinent test results  Airway Mallampati: III  TM Distance: >3 FB Neck ROM: Full    Dental no notable dental hx. (+) Teeth Intact, Dental Advisory Given   Pulmonary neg pulmonary ROS,    Pulmonary exam normal breath sounds clear to auscultation       Cardiovascular hypertension, + dysrhythmias Atrial Fibrillation  Rhythm:Regular Rate:Normal     Neuro/Psych negative neurological ROS  negative psych ROS   GI/Hepatic Neg liver ROS, hiatal hernia, GERD  Medicated,  Endo/Other  negative endocrine ROS  Renal/GU negative Renal ROS  negative genitourinary   Musculoskeletal  (+) Arthritis ,   Abdominal   Peds  Hematology  (+) Blood dyscrasia, anemia ,   Anesthesia Other Findings   Reproductive/Obstetrics negative OB ROS                            Anesthesia Physical Anesthesia Plan  ASA: III  Anesthesia Plan: MAC   Post-op Pain Management:    Induction: Intravenous  PONV Risk Score and Plan: 1 and Propofol infusion  Airway Management Planned: Nasal Cannula  Additional Equipment:   Intra-op Plan:   Post-operative Plan:   Informed Consent: I have reviewed the patients History and Physical, chart, labs and discussed the procedure including the risks, benefits and alternatives for the proposed anesthesia with the patient or authorized representative who has indicated his/her understanding and acceptance.     Dental advisory given  Plan Discussed with: CRNA  Anesthesia Plan Comments:         Anesthesia Quick Evaluation

## 2020-08-15 NOTE — Anesthesia Postprocedure Evaluation (Signed)
Anesthesia Post Note  Patient: David Vargas  Procedure(s) Performed: ESOPHAGOGASTRODUODENOSCOPY (EGD) WITH PROPOFOL (N/A )     Patient location during evaluation: Endoscopy Anesthesia Type: MAC Level of consciousness: awake and alert Pain management: pain level controlled Vital Signs Assessment: post-procedure vital signs reviewed and stable Respiratory status: spontaneous breathing, nonlabored ventilation and respiratory function stable Cardiovascular status: stable and blood pressure returned to baseline Postop Assessment: no apparent nausea or vomiting Anesthetic complications: no   No complications documented.  Last Vitals:  Vitals:   08/15/20 1350 08/15/20 1400  BP: 112/64 120/75  Pulse: 74 77  Resp: 15 16  Temp:    SpO2: 100% 99%    Last Pain:  Vitals:   08/15/20 1400  TempSrc:   PainSc: 0-No pain                 Anjelica Gorniak,W. EDMOND

## 2020-08-15 NOTE — Discharge Instructions (Signed)
David Vargas,  You were in the hospital because of GI bleeding that was because of a tear in your esophagus and an ulcer in your intestine. This has improved. You will be discharged with Protonix. You also developed an arrhythmia for which the cardiologist has started you on Amiodarone. Please take your antibiotic as prescribed until 8/23.

## 2020-08-15 NOTE — Op Note (Signed)
[facility] Patient Name: David Vargas Procedure Date : 08/15/2020 MRN: [id number] Attending MD: Ronald Lobo , MD Date of Birth: 1951/02/17 CSN: [id number] Age: 69 Admit Type: Inpatient Procedure:                Upper GI endoscopy Indications:              Acute post hemorrhagic anemia, --recent UGIB with                            egd showing Mallory-Weiss tear and duodenal ulcer                            with visible vessel, both clipped; no further                            clinical bleeding, but further drop in hemoglobin. Providers:                Ronald Lobo, MD, Benay Pillow, RN, Elspeth Cho Tech., Technician, Ladona Ridgel,                            Technician, Clearnce Sorrel, CRNA Referring MD:              Medicines:                Monitored Anesthesia Care Complications:            No immediate complications. Estimated Blood Loss:     Estimated blood loss was minimal. Procedure:                Pre-Anesthesia Assessment:                           - Prior to the procedure, a History and Physical                            was performed, and patient medications and                            allergies were reviewed. The patient's tolerance of                            previous anesthesia was also reviewed. The risks                            and benefits of the procedure and the sedation                            options and risks were discussed with the patient.                            All questions were answered, and informed consent  was obtained. Prior Anticoagulants: The patient has                            taken no previous anticoagulant or antiplatelet                            agents. ASA Grade Assessment: III - A patient with                            severe systemic disease. After reviewing the risks                            and benefits, the patient was deemed in                             satisfactory condition to undergo the procedure.                           After obtaining informed consent, the endoscope was                            passed under direct vision. Throughout the                            procedure, the patient's blood pressure, pulse, and                            oxygen saturations were monitored continuously. The                            GIF-H190 ([id number]) Olympus gastroscope was                            introduced through the mouth, and advanced to the                            second part of duodenum. The upper GI endoscopy was                            accomplished without difficulty. The patient                            tolerated the procedure well. Scope In: Scope Out: Findings:      The larynx was grossly normal.      A non-obstructing and mild Schatzki ring was found at the       gastroesophageal junction.      A 3 cm hiatal hernia was present.      A single linear erosion with a clip was found below the gastroesophageal       junction, in the hiatal hernia pouch, corresponding to a resolving       Mallory-Weiss tear. At the end of the procedure, it was noted that the       clip had become dislodged, with a drop or two of blood at the site but  no evident of active bleeding.      The exam of the stomach was otherwise normal. No blood present.      The cardia and gastric fundus were normal on retroflexion.      One non-bleeding duodenal ulcer, with an adherent clip, was found in the       second portion of the duodenum. No residual visible vessel seen. Impression:               - No active bleeding or blood in the stomach at the                            time of this procedure.                           - Grossly normal larynx.                           - Non-obstructing mild Schatzki ring.                           - 3 cm hiatal hernia with linear erosion with clip,                            which became  dislodged during procedure, consistent                            with healing Mallory Weiss tear.                           - Non-bleeding duodenal ulcer with clip still                            present from 4 days ago.                           - No specimens collected.                           - Patient appears to be at low risk for re-bleeding. Recommendation:           - Continue present medications. Advance diet.                            Discharge soon (today or tomorrow). Procedure Code(s):        --- Professional ---                           [phone], Esophagogastroduodenoscopy, flexible,                            transoral; diagnostic, including collection of                            specimen(s) by brushing or washing, when performed                            (  separate procedure) Diagnosis Code(s):        --- Professional ---                           K22.2, Esophageal obstruction                           K25.4, Chronic or unspecified gastric ulcer with                            hemorrhage                           K26.9, Duodenal ulcer, unspecified as acute or                            chronic, without hemorrhage or perforation                           D62, Acute posthemorrhagic anemia CPT copyright 2019 American Medical Association. All rights reserved. The codes documented in this report are preliminary and upon coder review may  be revised to meet current compliance requirements. Ronald Lobo, MD 08/15/2020 1:47:10 PM This report has been signed electronically. Number of Addenda: 0

## 2020-08-15 NOTE — Progress Notes (Addendum)
Patient's endoscopy was well-tolerated, and shows very satisfactory healing of the Mallory-Weiss tear and also the duodenal ulcer, which remains securely clipped.  There was no blood in the stomach at the time of today's endoscopy.  Impression:   1.  Patient's recent drop in hemoglobin, which has stabilized over the past 24 hours, was probably due to equilibration rather than further bleeding.  2.  The patient appears to be at very low risk for further bleeding.  Recommendations:  1.  Will restart diet and switch PPI to p.o.  2.  Discharge either later today or tomorrow would be reasonable, since the patient appears to be at low risk for further bleeding.  3.  I would recommend that the patient be sent home on pantoprazole 40 mg daily, in place of the cimetidine he was using prior to admission.  This will help to prophylax against recurrent ulcer disease when he uses nonsteroidal anti-inflammatory drugs.  4.  I would recommend no nonsteroidal anti-inflammatory drugs for at least 2 weeks.  5.  The patient should call our office to arrange a follow-up office visit with Dr. Therisa Doyne approximately 3 to 4 weeks from now, and to decide about long-term PPI management and whether follow-up endoscopy to confirm healing is needed (typically, it would not be considered necessary in this setting).  I have given the patient my card for this purpose.   Discussed with Dr. Lonny Prude, who points out that the patient's H. pylori antibody is borderline elevated 0.81 (normal up to 0.79).  Biopsies for H. pylori were not obtained either during Dr. Encarnacion Slates EGD or mine; therefore, Dr. Therisa Doyne may want to consider doing a stool antigen for H. pylori when the patient returns for follow-up, and treating for H. pylori if positive, since this patient has had duodenal ulcer which is considered an indication for that treatment.  Cleotis Nipper, M.D. Pager [phone] If no answer or after 5 PM call [phone]

## 2020-08-16 ENCOUNTER — Encounter (HOSPITAL_COMMUNITY): Payer: Self-pay | Admitting: Gastroenterology

## 2020-08-17 DIAGNOSIS — J432 Centrilobular emphysema: Secondary | ICD-10-CM | POA: Diagnosis not present

## 2020-08-17 DIAGNOSIS — I10 Essential (primary) hypertension: Secondary | ICD-10-CM | POA: Diagnosis not present

## 2020-08-17 DIAGNOSIS — D509 Iron deficiency anemia, unspecified: Secondary | ICD-10-CM | POA: Diagnosis not present

## 2020-08-17 DIAGNOSIS — D500 Iron deficiency anemia secondary to blood loss (chronic): Secondary | ICD-10-CM | POA: Diagnosis not present

## 2020-08-17 DIAGNOSIS — I480 Paroxysmal atrial fibrillation: Secondary | ICD-10-CM | POA: Diagnosis not present

## 2020-08-25 ENCOUNTER — Encounter (HOSPITAL_COMMUNITY): Payer: Self-pay

## 2020-08-25 ENCOUNTER — Encounter (HOSPITAL_COMMUNITY): Payer: Medicare HMO

## 2020-08-26 DIAGNOSIS — R972 Elevated prostate specific antigen [PSA]: Secondary | ICD-10-CM | POA: Diagnosis not present

## 2020-08-26 DIAGNOSIS — N200 Calculus of kidney: Secondary | ICD-10-CM | POA: Diagnosis not present

## 2020-08-26 DIAGNOSIS — N3000 Acute cystitis without hematuria: Secondary | ICD-10-CM | POA: Diagnosis not present

## 2020-08-27 DIAGNOSIS — R972 Elevated prostate specific antigen [PSA]: Secondary | ICD-10-CM | POA: Diagnosis not present

## 2020-08-27 DIAGNOSIS — D500 Iron deficiency anemia secondary to blood loss (chronic): Secondary | ICD-10-CM | POA: Diagnosis not present

## 2020-08-27 DIAGNOSIS — Z87898 Personal history of other specified conditions: Secondary | ICD-10-CM | POA: Diagnosis not present

## 2020-08-27 DIAGNOSIS — I480 Paroxysmal atrial fibrillation: Secondary | ICD-10-CM | POA: Diagnosis not present

## 2020-09-01 ENCOUNTER — Telehealth (HOSPITAL_COMMUNITY): Payer: Self-pay | Admitting: Cardiology

## 2020-09-01 NOTE — Telephone Encounter (Signed)
Patient cancelled Myoview due to him in the hospital and was told not to reschedule until after he sees Estella Husk to see if needed. Order will be removed from the WQ and we will reinstate or create new order if patient needs. Thank you,

## 2020-09-11 DIAGNOSIS — D509 Iron deficiency anemia, unspecified: Secondary | ICD-10-CM | POA: Diagnosis not present

## 2020-09-14 ENCOUNTER — Other Ambulatory Visit: Payer: Medicare HMO | Admitting: *Deleted

## 2020-09-14 ENCOUNTER — Other Ambulatory Visit: Payer: Self-pay

## 2020-09-14 DIAGNOSIS — E782 Mixed hyperlipidemia: Secondary | ICD-10-CM

## 2020-09-14 LAB — LIPID PANEL
Chol/HDL Ratio: 2.3 ratio (ref 0.0–5.0)
Cholesterol, Total: 104 mg/dL (ref 100–199)
HDL: 45 mg/dL (ref >39)
LDL Chol Calc (NIH): 48 mg/dL (ref 0–99)
Triglycerides: 44 mg/dL (ref 0–149)
VLDL Cholesterol Cal: 11 mg/dL (ref 5–40)

## 2020-09-14 LAB — ALT: ALT: 13 IU/L (ref 0–44)

## 2020-09-15 DIAGNOSIS — E785 Hyperlipidemia, unspecified: Secondary | ICD-10-CM

## 2020-09-15 DIAGNOSIS — E782 Mixed hyperlipidemia: Secondary | ICD-10-CM

## 2020-09-16 DIAGNOSIS — D509 Iron deficiency anemia, unspecified: Secondary | ICD-10-CM | POA: Diagnosis not present

## 2020-09-16 DIAGNOSIS — K226 Gastro-esophageal laceration-hemorrhage syndrome: Secondary | ICD-10-CM | POA: Diagnosis not present

## 2020-09-16 DIAGNOSIS — K269 Duodenal ulcer, unspecified as acute or chronic, without hemorrhage or perforation: Secondary | ICD-10-CM | POA: Diagnosis not present

## 2020-09-16 DIAGNOSIS — Z86010 Personal history of colonic polyps: Secondary | ICD-10-CM | POA: Diagnosis not present

## 2020-09-17 ENCOUNTER — Other Ambulatory Visit: Payer: Self-pay

## 2020-09-17 ENCOUNTER — Ambulatory Visit (INDEPENDENT_AMBULATORY_CARE_PROVIDER_SITE_OTHER): Payer: Medicare HMO | Admitting: Pharmacist

## 2020-09-17 DIAGNOSIS — E782 Mixed hyperlipidemia: Secondary | ICD-10-CM | POA: Diagnosis not present

## 2020-09-17 MED ORDER — ROSUVASTATIN CALCIUM 10 MG PO TABS: 10.0000 mg | ORAL_TABLET | Freq: Every day | ORAL | 11 refills | Status: DC

## 2020-09-17 NOTE — Patient Instructions (Addendum)
It was nice to meet you today  Your LDL goal is < 70  Start taking rosuvastatin 10mg  once daily  Stay active with 30 minutes of moderate exercise 5 days a week  Limit your intake of saturated fats (red meat, fried/fast food, and full fat dairy products)  Recheck cholesterol on Tuesday, December 14th. Come in any time after 7:30am for fasting lab work.  Call Braxley Balandran, Pharmacist with any problems tolerating your medication [phone]

## 2020-09-17 NOTE — Progress Notes (Signed)
Patient ID: David Vargas                 DOB: [birth year]                    MRN: [id number]     HPI: David Vargas is a 69 y.o. male patient referred to lipid clinic by Dr Radford Pax. PMH is significant for PAF, HLD, mildly dilated aortic root, and nephrolithiasis. Cardiac catheterization in 2005 for abnormal nuclear study showed no significant CAD echo probable intramyocardial mid LAD segment with minimal systolic depression. He had CT on 07/08/20 which showed aortic atherosclerosis and 2 vessel CAD. Pt underwent follow up calcium score on 07/28/20 which was 43, 44th percentile for age and sex.   Pt was hospitalized on 08/05/20 for fever, chills, N/V and hematuria after recent prostate biopsy on 08/03/20. Diagnosed with complicated UTI in setting of recent biopsy and was discharged 8/13 on Keflex. Readmitted on 8/16 to the hospital due to 4 episodes of coffee-ground vomiting at home. He was in afib secondary to acute blood loss anemia, 3 cardioversions attempted but failed, pt started on amiodarone. Hgb dropped from 14 to 9, EGD on 8/17 showed deep Mallory-Weiss tear and duodenal ulcer, both treated with endo clips. Repeat EGD 8/21 was not significant for active bleeding. Pt was discharged on Protonix and advised to follow up with GI due to mildly elevated H Pylori IgG antibody result.  Pt sent a MyChart message on 9/21 reporting numbness, tingling, heaviness and weakness in his legs as well as knee pain that he attributed to his atorvastatin. He was referred to lipid clinic for further management.  Pt presents today in food spirits. He stopped taking his atorvastatin 2 days ago (had been on therapy for about 1 month total). Has noticed some slight improvement in his symptoms. Previously took simvastatin years ago but did not tolerate this either.  He saw his PCP recently who started him on ferrous sulfate and multivitamin supplementation. They checked his hemoglobin which has increased back up to 10.8, goal  > 12 per PCP. He is continuing on Protonix for now as well. He has been tolerating those medications well, has not noticed any tarry stool. Has noticed some swelling in his legs, minimal noted in office today but pt will keep an eye on it.  He finished taking his amiodarone a few days ago. Plan per discharge summary and cards consult was for pt to take a total of 1 month of amiodarone then d/c as afib was secondary to acute blood loss anemia.  Current Medications: none Intolerances: simvastatin - fatigue, atorvastatin 20mg  daily - numbness and tingling in his feet, weakness in his legs Risk Factors: CAD noted on CT, family history of CAD LDL goal: 70mg /dL  Exercise: Gladys Damme  Family History: Heart attack in his mother at age 50 (was overweight and a heavy smoker); Liver cancer in his father; Multiple sclerosis in his sister.  Social History: Denies tobacco use and illicit drug use, occasional alcohol use.  Labs: 09/14/20: TC 104, TG 44, HDL 45, LDL 48 (atorvastatin 20mg  daily)  Past Medical History:  Diagnosis Date  . Arrhythmia    paroxysmal afib  . Arthritis   . Basal cell carcinoma    followed by dermatology  . Cancer ([place])    melanoma - left shoulder  . Complication of anesthesia    slow to wake up  . GERD (gastroesophageal reflux disease)   . H/O colonoscopy  2009, polypoid colorectal mucosa found, Dr. Oletta Lamas advised repeat in 3 years  . Hiatal hernia    upper GI in 2003  . History of kidney stones   . History of nuclear stress test 2005   abnormal, cardiac cath done   . Hypertension     Current Outpatient Medications on File Prior to Visit  Medication Sig Dispense Refill  . acetaminophen (TYLENOL) 325 MG tablet Take 650 mg by mouth every 6 (six) hours as needed for mild pain or fever.    Marland Kitchen amiodarone (PACERONE) 200 MG tablet Take 1 tablet (200 mg total) by mouth 2 (two) times daily for 5 days, THEN 1 tablet (200 mg total) daily for 14 days. 24 tablet 0  .  atorvastatin (LIPITOR) 20 MG tablet Take 1 tablet (20 mg total) by mouth at bedtime.    . famotidine (PEPCID) 20 MG tablet Take 20 mg by mouth daily.    . Melatonin 5 MG TABS Take 5 mg by mouth at bedtime as needed (for sleep).     . pantoprazole (PROTONIX) 40 MG tablet Take 1 tablet (40 mg total) by mouth daily. 30 tablet 0  . tadalafil (CIALIS) 10 MG tablet Take 5 mg by mouth daily as needed for erectile dysfunction.     . tamsulosin (FLOMAX) 0.4 MG CAPS capsule Take 1 capsule (0.4 mg total) by mouth daily after supper.     No current facility-administered medications on file prior to visit.    No Known Allergies  Assessment/Plan:  1. Hyperlipidemia - LDL goal < 70 due to CAD noted on CT scan as well as family history of ASCVD. Pt is intolerant to atorvastatin and simvastatin. Will try rosuvastatin 10mg  daily due to better side effect profile. Advised pt that he should wait 1-2 weeks before starting rosuvastatin to ensure that his leg pain continues to improve off of atorvastatin. He will call clinic with any trouble tolerating rosuvastatin. Follow up labs scheduled in December to assess efficacy.  Post-hospital follow up scheduled next week with Ermalinda Barrios.  Michaelia Beilfuss E. Namrata Dangler, PharmD, BCACP, [facility] [address] [place], [place] [address] Phone: [phone]; Fax: [phone] 09/17/2020 9:32 AM

## 2020-09-21 NOTE — Progress Notes (Signed)
Cardiology Office Note    Date:  09/23/2020   ID:  David Vargas, DOB 05/05/1951, MRN [id number]  PCP:  Hulan Fess, MD  Cardiologist: Fransico Him, MD EPS: None  Chief Complaint  Patient presents with  . Hospitalization Follow-up    History of Present Illness:  David Vargas is a 69 y.o. male with a hx of PAF that occurred once with a kidney stone and he has been treated with aspirin.  He also has hypertension, hyperlipidemia and mildly dilated aortic root.  Last echo in 2015 was normal LVEF 60-65% with trivial TR and normal aortic root.  Cardiac catheterization in 2005 for abnormal nuclear study showed no significant CAD echo probable intramyocardial mid LAD segment with minimal systolic depression.   Patient last saw Dr. Radford Pax 01/16/2020 at which time he was doing well.  He had lost 60 pounds over 7 years and was off his blood pressure medications.  Chest CT showed coronary artery calcifications 06/2020 so Dr. Radford Pax ordered Carlton Adam and coronary calcium score is 39 44th percentile for age and sex match results.  The dilated ascending aorta at 4 cm and scattered calcification.  Patient discharged 08/15/2020 after admission for hypotension, tachycardia with A. fib with RVR failed cardioversion x3 heart rate in the setting of GI bleed, hemoglobin had dropped down to 5.  Patient was started on IV amiodarone and transition to oral.  Plan was to discontinue this after a month.  No anticoagulation with CHA2DS2-VASc score of 1.  Patient comes in for hospital f/u. Feet tingling is improving off atorvastatin.  Past Medical History:  Diagnosis Date  . Arrhythmia    paroxysmal afib  . Arthritis   . Basal cell carcinoma    followed by dermatology  . Cancer ([place])    melanoma - left shoulder  . Complication of anesthesia    slow to wake up  . GERD (gastroesophageal reflux disease)   . H/O colonoscopy    2009, polypoid colorectal mucosa found, Dr. Oletta Lamas advised repeat in 3 years  .  Hiatal hernia    upper GI in 2003  . History of kidney stones   . History of nuclear stress test 2005   abnormal, cardiac cath done   . Hypertension     Past Surgical History:  Procedure Laterality Date  . BIOPSY PROSTATE  08/03/2020  . CARDIAC CATHETERIZATION  2005   no evidence of significamt atherosclerosis  . ESOPHAGOGASTRODUODENOSCOPY (EGD) WITH PROPOFOL N/A 08/11/2020   Procedure: ESOPHAGOGASTRODUODENOSCOPY (EGD) WITH PROPOFOL;  Surgeon: Ronnette Juniper, MD;  Location: [place];  Service: Gastroenterology;  Laterality: N/A;  . ESOPHAGOGASTRODUODENOSCOPY (EGD) WITH PROPOFOL N/A 08/15/2020   Procedure: ESOPHAGOGASTRODUODENOSCOPY (EGD) WITH PROPOFOL;  Surgeon: Ronald Lobo, MD;  Location: [place];  Service: Endoscopy;  Laterality: N/A;  . EXTRACORPOREAL SHOCK WAVE LITHOTRIPSY Right 02/17/2020   Procedure: EXTRACORPOREAL SHOCK WAVE LITHOTRIPSY (ESWL);  Surgeon: Irine Seal, MD;  Location: [facility];  Service: Urology;  Laterality: Right;  . HEMOSTASIS CLIP PLACEMENT  08/11/2020   Procedure: HEMOSTASIS CLIP PLACEMENT;  Surgeon: Ronnette Juniper, MD;  Location: [facility] ENDOSCOPY;  Service: Gastroenterology;;  . HEMOSTASIS CONTROL  08/11/2020   Procedure: HEMOSTASIS CONTROL;  Surgeon: Ronnette Juniper, MD;  Location: [place];  Service: Gastroenterology;;  . HERNIA REPAIR     right groin  . KNEE SURGERY     left 1984, right 2010  Arthroscopic  . SHOULDER SURGERY Right    rotator cuff repair  . TOTAL KNEE ARTHROPLASTY Left 11/28/2017  Procedure: TOTAL KNEE ARTHROPLASTY;  Surgeon: Renette Butters, MD;  Location: [place];  Service: Orthopedics;  Laterality: Left;    Current Medications: Current Meds  Medication Sig  . acetaminophen (TYLENOL) 325 MG tablet Take 650 mg by mouth every 6 (six) hours as needed for mild pain or fever.  . famotidine (PEPCID) 20 MG tablet Take 20 mg by mouth daily.  . ferrous sulfate 325 (65 FE) MG EC tablet Take 325 mg by mouth in the morning and  at bedtime.  . Melatonin 5 MG TABS Take 5 mg by mouth at bedtime as needed (for sleep).   . Multiple Vitamins-Minerals (MULTIVITAMIN WITH MINERALS) tablet Take 1 tablet by mouth daily.  . pantoprazole (PROTONIX) 40 MG tablet Take 1 tablet (40 mg total) by mouth daily.  . rosuvastatin (CRESTOR) 10 MG tablet Take 1 tablet (10 mg total) by mouth daily.  . tadalafil (CIALIS) 10 MG tablet Take 5 mg by mouth daily as needed for erectile dysfunction.   . tamsulosin (FLOMAX) 0.4 MG CAPS capsule Take 0.4 mg by mouth as needed. For kidney stones     Allergies:   Atorvastatin and Simvastatin   Social History   Socioeconomic History  . Marital status: Married    Spouse name: Not on file  . Number of children: Not on file  . Years of education: Not on file  . Highest education level: Not on file  Occupational History  . Not on file  Tobacco Use  . Smoking status: Never Smoker  . Smokeless tobacco: Never Used  Vaping Use  . Vaping Use: Never used  Substance and Sexual Activity  . Alcohol use: Yes    Comment: few times a week  . Drug use: No  . Sexual activity: Not on file  Other Topics Concern  . Not on file  Social History Narrative  . Not on file   Social Determinants of Health   Financial Resource Strain:   . Difficulty of Paying Living Expenses: Not on file  Food Insecurity:   . Worried About Charity fundraiser in the Last Year: Not on file  . Ran Out of Food in the Last Year: Not on file  Transportation Needs:   . Lack of Transportation (Medical): Not on file  . Lack of Transportation (Non-Medical): Not on file  Physical Activity:   . Days of Exercise per Week: Not on file  . Minutes of Exercise per Session: Not on file  Stress:   . Feeling of Stress : Not on file  Social Connections:   . Frequency of Communication with Friends and Family: Not on file  . Frequency of Social Gatherings with Friends and Family: Not on file  . Attends Religious Services: Not on file  .  Active Member of Clubs or Organizations: Not on file  . Attends Archivist Meetings: Not on file  . Marital Status: Not on file     Family History:  The patient's   family history includes Heart attack in his mother; Liver cancer in his father; Multiple sclerosis in his sister.   ROS:   Please see the history of present illness.    ROS All other systems reviewed and are negative.   PHYSICAL EXAM:   VS:  BP 128/68   Pulse 75   Ht 6\' 4"  (1.93 m)   Wt 223 lb 12.8 oz (101.5 kg)   SpO2 97%   BMI 27.24 kg/m   Physical Exam  GEN: Well  nourished, well developed, in no acute distress  Neck: no JVD, carotid bruits, or masses Cardiac:RRR; no murmurs, rubs, or gallops  Respiratory:  clear to auscultation bilaterally, normal work of breathing GI: soft, nontender, nondistended, + BS Ext: trace ankle edema, without cyanosis, clubbing,  Good distal pulses bilaterally Neuro:  Alert and Oriented x 3 Psych: euthymic mood, full affect  Wt Readings from Last 3 Encounters:  09/23/20 223 lb 12.8 oz (101.5 kg)  08/11/20 227 lb 11.8 oz (103.3 kg)  08/05/20 218 lb (98.9 kg)      Studies/Labs Reviewed:   EKG:  EKG is not ordered today.    Recent Labs: 08/12/2020: Magnesium 2.3 08/14/2020: Platelets 186 08/15/2020: BUN 18; Creatinine, Ser 0.93; Hemoglobin 7.5; Potassium 3.6; Sodium 140 09/14/2020: ALT 13   Lipid Panel    Component Value Date/Time   CHOL 104 09/14/2020 1006   TRIG 44 09/14/2020 1006   HDL 45 09/14/2020 1006   CHOLHDL 2.3 09/14/2020 1006   LDLCALC 48 09/14/2020 1006    Additional studies/ records that were reviewed today include:  Calcium score 07/28/2020 FINDINGS: Non-cardiac: See separate report from [facility].   Ascending Aorta: Mildly dilated ascending aorta at 4cm. Scattered calcifications.   Pericardium: Normal   Coronary arteries: Normal coronary origins.   IMPRESSION: Coronary calcium score of 105. This was 44th percentile for age  and sex matched control.   Fransico Him     Electronically Signed   By: Fransico Him   On: 07/28/2020 15:01       ASSESSMENT:    1. Paroxysmal atrial fibrillation (HCC)   2. Essential hypertension   3. Hyperlipidemia, unspecified hyperlipidemia type   4. Dilated aortic root ([place])   5. Coronary artery calcification      PLAN:  In order of problems listed above:  Paroxysmal atrial fibrillation with recurrence in the setting of GI bleed and hemorrhagic shock unsuccessful cardioversion in the ER transition to 1 month of amiodarone, no anticoagulation with CHA2DS2-VASc score of 1. Patient off Amiodarone. HR regular today. No further palpitations.  History of hypertension but lost 60 pounds and has been off all antihypertensives. Mild ankle edema improving. 2 gm sodium diet.  Hyperlipidemia intolerant to atorvastatin and simvastatin.  Lipid clinic started him on rosuvastatin 10 mg once daily-hasn't started yet. Still having some tingling of his feet. He says he'll try to start soon  Mildly dilated aortic root repeat CT next year  Coronary calcifications found on coronary CT.  Calcium score 105 44% for age and sex match control   Medication Adjustments/Labs and Tests Ordered: Current medicines are reviewed at length with the patient today.  Concerns regarding medicines are outlined above.  Medication changes, Labs and Tests ordered today are listed in the Patient Instructions below. Patient Instructions   Medication Instructions:  Your physician recommends that you continue on your current medications as directed. Please refer to the Current Medication list given to you today.  *If you need a refill on your cardiac medications before your next appointment, please call your pharmacy*   Lab Work: None If you have labs (blood work) drawn today and your tests are completely normal, you will receive your results only by: Marland Kitchen MyChart Message (if you have MyChart) OR . A paper  copy in the mail If you have any lab test that is abnormal or we need to change your treatment, we will call you to review the results.   Testing/Procedures: None   Follow-Up: At [facility],  you and your health needs are our priority.  As part of our continuing mission to provide you with exceptional heart care, we have created designated Provider Care Teams.  These Care Teams include your primary Cardiologist (physician) and Advanced Practice Providers (APPs -  Physician Assistants and Nurse Practitioners) who all work together to provide you with the care you need, when you need it.  We recommend signing up for the patient portal called "MyChart".  Sign up information is provided on this After Visit Summary.  MyChart is used to connect with patients for Virtual Visits (Telemedicine).  Patients are able to view lab/test results, encounter notes, upcoming appointments, etc.  Non-urgent messages can be sent to your provider as well.   To learn more about what you can do with MyChart, go to [email or web address].    Your next appointment:   6 month(s)  The format for your next appointment:   In Person  Provider:   Fransico Him, MD   Other Instructions Two Gram Sodium Diet 2000 mg  What is Sodium? Sodium is a mineral found naturally in many foods. The most significant source of sodium in the diet is table salt, which is about 40% sodium.  Processed, convenience, and preserved foods also contain a large amount of sodium.  The body needs only 500 mg of sodium daily to function,  A normal diet provides more than enough sodium even if you do not use salt.  Why Limit Sodium? A build up of sodium in the body can cause thirst, increased blood pressure, shortness of breath, and water retention.  Decreasing sodium in the diet can reduce edema and risk of heart attack or stroke associated with high blood pressure.  Keep in mind that there are many other factors involved in these health  problems.  Heredity, obesity, lack of exercise, cigarette smoking, stress and what you eat all play a role.  General Guidelines:  Do not add salt at the table or in cooking.  One teaspoon of salt contains over 2 grams of sodium.  Read food labels  Avoid processed and convenience foods  Ask your dietitian before eating any foods not dicussed in the menu planning guidelines  Consult your physician if you wish to use a salt substitute or a sodium containing medication such as antacids.  Limit milk and milk products to 16 oz (2 cups) per day.  Shopping Hints:  READ LABELS!! "Dietetic" does not necessarily mean low sodium.  Salt and other sodium ingredients are often added to foods during processing.   Menu Planning Guidelines Food Group Choose More Often Avoid  Beverages (see also the milk group All fruit juices, low-sodium, salt-free vegetables juices, low-sodium carbonated beverages Regular vegetable or tomato juices, commercially softened water used for drinking or cooking  Breads and Cereals Enriched white, wheat, rye and pumpernickel bread, hard rolls and dinner rolls; muffins, cornbread and waffles; most dry cereals, cooked cereal without added salt; unsalted crackers and breadsticks; low sodium or homemade bread crumbs Bread, rolls and crackers with salted tops; quick breads; instant hot cereals; pancakes; commercial bread stuffing; self-rising flower and biscuit mixes; regular bread crumbs or cracker crumbs  Desserts and Sweets Desserts and sweets mad with mild should be within allowance Instant pudding mixes and cake mixes  Fats Butter or margarine; vegetable oils; unsalted salad dressings, regular salad dressings limited to 1 Tbs; light, sour and heavy cream Regular salad dressings containing bacon fat, bacon bits, and salt pork; snack dips made with instant soup  mixes or processed cheese; salted nuts  Fruits Most fresh, frozen and canned fruits Fruits processed with salt or  sodium-containing ingredient (some dried fruits are processed with sodium sulfites        Vegetables Fresh, frozen vegetables and low- sodium canned vegetables Regular canned vegetables, sauerkraut, pickled vegetables, and others prepared in brine; frozen vegetables in sauces; vegetables seasoned with ham, bacon or salt pork  Condiments, Sauces, Miscellaneous  Salt substitute with physician's approval; pepper, herbs, spices; vinegar, lemon or lime juice; hot pepper sauce; garlic powder, onion powder, low sodium soy sauce (1 Tbs.); low sodium condiments (ketchup, chili sauce, mustard) in limited amounts (1 tsp.) fresh ground horseradish; unsalted tortilla chips, pretzels, potato chips, popcorn, salsa (1/4 cup) Any seasoning made with salt including garlic salt, celery salt, onion salt, and seasoned salt; sea salt, rock salt, kosher salt; meat tenderizers; monosodium glutamate; mustard, regular soy sauce, barbecue, sauce, chili sauce, teriyaki sauce, steak sauce, Worcestershire sauce, and most flavored vinegars; canned gravy and mixes; regular condiments; salted snack foods, olives, picles, relish, horseradish sauce, catsup   Food preparation: Try these seasonings Meats:    Pork Sage, onion Serve with applesauce  Chicken Poultry seasoning, thyme, parsley Serve with cranberry sauce  Lamb Curry powder, rosemary, garlic, thyme Serve with mint sauce or jelly  Veal Marjoram, basil Serve with current jelly, cranberry sauce  Beef Pepper, bay leaf Serve with dry mustard, unsalted chive butter  Fish [place] leaf, dill Serve with unsalted lemon butter, unsalted parsley butter  Vegetables:    Asparagus Lemon juice   Broccoli Lemon juice   Carrots Mustard dressing parsley, mint, nutmeg, glazed with unsalted butter and sugar   Green beans Marjoram, lemon juice, nutmeg,dill seed   Tomatoes Basil, marjoram, onion   Spice /blend for [employer]" 4 tsp ground thyme 1 tsp ground sage 3 tsp ground rosemary 4 tsp  ground marjoram   Test your knowledge 1. A product that says "Salt Free" may still contain sodium. True or False 2. Garlic Powder and Hot Pepper Sauce an be used as alternative seasonings.True or False 3. Processed foods have more sodium than fresh foods.  True or False 4. Canned Vegetables have less sodium than froze True or False  WAYS TO DECREASE YOUR SODIUM INTAKE 1. Avoid the use of added salt in cooking and at the table.  Table salt (and other prepared seasonings which contain salt) is probably one of the greatest sources of sodium in the diet.  Unsalted foods can gain flavor from the sweet, sour, and butter taste sensations of herbs and spices.  Instead of using salt for seasoning, try the following seasonings with the foods listed.  Remember: how you use them to enhance natural food flavors is limited only by your creativity... Allspice-Meat, fish, eggs, fruit, peas, red and yellow vegetables Almond Extract-Fruit baked goods Anise Seed-Sweet breads, fruit, carrots, beets, cottage cheese, cookies (tastes like licorice) Basil-Meat, fish, eggs, vegetables, rice, vegetables salads, soups, sauces Bay Leaf-Meat, fish, stews, poultry Burnet-Salad, vegetables (cucumber-like flavor) Caraway Seed-Bread, cookies, cottage cheese, meat, vegetables, cheese, rice Cardamon-Baked goods, fruit, soups Celery Powder or seed-Salads, salad dressings, sauces, meatloaf, soup, bread.Do not use  celery salt Chervil-Meats, salads, fish, eggs, vegetables, cottage cheese (parsley-like flavor) Chili Power-Meatloaf, chicken cheese, corn, eggplant, egg dishes Chives-Salads cottage cheese, egg dishes, soups, vegetables, sauces Cilantro-Salsa, casseroles Cinnamon-Baked goods, fruit, pork, lamb, chicken, carrots Cloves-Fruit, baked goods, fish, pot roast, green beans, beets, carrots Coriander-Pastry, cookies, meat, salads, cheese (lemon-orange flavor) Cumin-Meatloaf, fish,cheese, eggs, cabbage,fruit  pie (caraway  flavor) [employer], fruit, eggs, fish, poultry, cottage cheese, vegetables Dill Seed-Meat, cottage cheese, poultry, vegetables, fish, salads, bread Fennel Seed-Bread, cookies, apples, pork, eggs, fish, beets, cabbage, cheese, Licorice-like flavor Garlic-(buds or powder) Salads, meat, poultry, fish, bread, butter, vegetables, potatoes.Do not  use garlic salt Ginger-Fruit, vegetables, baked goods, meat, fish, poultry Horseradish Root-Meet, vegetables, butter Lemon Juice or Extract-Vegetables, fruit, tea, baked goods, fish salads Mace-Baked goods fruit, vegetables, fish, poultry (taste like nutmeg) Maple Extract-Syrups Marjoram-Meat, chicken, fish, vegetables, breads, green salads (taste like Sage) Mint-Tea, lamb, sherbet, vegetables, desserts, carrots, cabbage Mustard, Dry or Seed-Cheese, eggs, meats, vegetables, poultry Nutmeg-Baked goods, fruit, chicken, eggs, vegetables, desserts Onion Powder-Meat, fish, poultry, vegetables, cheese, eggs, bread, rice salads (Do not use   Onion salt) Orange Extract-Desserts, baked goods Oregano-Pasta, eggs, cheese, onions, pork, lamb, fish, chicken, vegetables, green salads Paprika-Meat, fish, poultry, eggs, cheese, vegetables Parsley Flakes-Butter, vegetables, meat fish, poultry, eggs, bread, salads (certain forms may   Contain sodium Pepper-Meat fish, poultry, vegetables, eggs Peppermint Extract-Desserts, baked goods Poppy Seed-Eggs, bread, cheese, fruit dressings, baked goods, noodles, vegetables, cottage  [employer], poultry, meat, fish, cauliflower, turnips,eggs bread Saffron-Rice, bread, veal, chicken, fish, eggs Sage-Meat, fish, poultry, onions, eggplant, tomateos, pork, stews Savory-Eggs, salads, poultry, meat, rice, vegetables, soups, pork Tarragon-Meat, poultry, fish, eggs, butter, vegetables (licorice-like flavor)  Thyme-Meat, poultry, fish, eggs, vegetables, (clover-like flavor), sauces,  soups Tumeric-Salads, butter, eggs, fish, rice, vegetables (saffron-like flavor) Vanilla Extract-Baked goods, candy Vinegar-Salads, vegetables, meat marinades Walnut Extract-baked goods, candy  2. Choose your Foods Wisely   The following is a list of foods to avoid which are high in sodium:  Meats-Avoid all smoked, canned, salt cured, dried and kosher meat and fish as well as Anchovies   [employer] meats:Bologna, Liverwurst, Pastrami Canned meat or fish  Marinated herring Caviar    Pepperoni Corned Beef   Pizza Dried chipped beef  Salami Frozen breaded fish or meat Salt pork Frankfurters or hot dogs  Sardines Gefilte fish   Sausage Ham (boiled ham, Proscuitto Smoked butt    spiced ham)   Spam      TV Dinners Vegetables Canned vegetables (Regular) Relish Canned mushrooms  Sauerkraut Olives    Tomato juice Pickles  Bakery and Dessert Products Canned puddings  Cream pies Cheesecake   Decorated cakes Cookies  Beverages/Juices Tomato juice, regular  Gatorade   V-8 vegetable juice, regular  Breads and Cereals Biscuit mixes   Salted potato chips, corn chips, pretzels Bread stuffing mixes  Salted crackers and rolls Pancake and waffle mixes Self-rising flour  Seasonings Accent    Meat sauces Barbecue sauce  Meat tenderizer Catsup    Monosodium glutamate (MSG) Celery salt   Onion salt Chili sauce   Prepared mustard Garlic salt   Salt, seasoned salt, sea salt Gravy mixes   Soy sauce Horseradish   Steak sauce Ketchup   Tartar sauce Lite salt    Teriyaki sauce Marinade mixes   Worcestershire sauce  Others Baking powder   Cocoa and cocoa mixes Baking soda   Commercial casserole mixes Candy-caramels, chocolate  Dehydrated soups    Bars, fudge,nougats  Instant rice and pasta mixes Canned broth or soup  Maraschino cherries Cheese, aged and processed cheese and cheese spreads  Learning Assessment Quiz  Indicated T (for True) or F (for False) for each of the  following statements:  1. _____ Fresh fruits and vegetables and unprocessed grains are generally low in sodium 2. _____ [employer]  may contain a considerable amount of sodium, depending on the source 3. _____ You can always tell if a food is high in sodium by tasting it 4. _____ Certain laxatives my be high in sodium and should be avoided unless prescribed   by a physician or pharmacist 5. _____ Salt substitutes may be used freely by anyone on a sodium restricted diet 6. _____ Sodium is present in table salt, food additives and as a natural component of   most foods 7. _____ Table salt is approximately 90% sodium 8. _____ Limiting sodium intake may help prevent excess fluid accumulation in the body 9. _____ On a sodium-restricted diet, seasonings such as bouillon soy sauce, and    cooking wine should be used in place of table salt 10. _____ On an ingredient list, a product which lists monosodium glutamate as the first   ingredient is an appropriate food to include on a low sodium diet  Circle the best answer(s) to the following statements (Hint: there may be more than one correct answer)  11. On a low-sodium diet, some acceptable snack items are:    A. Olives  F. Bean dip   K. Grapefruit juice    B. Salted Pretzels G. Commercial Popcorn   L. Canned peaches    C. Carrot Sticks  H. Bouillon   M. Unsalted nuts   D. [place] fries  I. Peanut butter crackers N. Salami   E. Sweet pickles J. Tomato Juice   O. Pizza  12.  Seasonings that may be used freely on a reduced - sodium diet include   A. Lemon wedges F.Monosodium glutamate K. Celery seed    B.Soysauce   G. Pepper   L. Mustard powder   C. Sea salt  H. Cooking wine  M. Onion flakes   D. Vinegar  E. Prepared horseradish N. Salsa   E. Sage   J. Worcestershire sauce  O. [address]      Sumner Boast, PA-C  09/23/2020 8:09 AM    [facility] [place], [place], [place]  [address] Phone: [phone];  Fax: [phone]

## 2020-09-23 ENCOUNTER — Other Ambulatory Visit: Payer: Self-pay

## 2020-09-23 ENCOUNTER — Ambulatory Visit: Payer: Medicare HMO | Admitting: Physician Assistant

## 2020-09-23 ENCOUNTER — Encounter: Payer: Self-pay | Admitting: Physician Assistant

## 2020-09-23 VITALS — BP 128/68 | HR 75 | Ht 76.0 in | Wt 223.8 lb

## 2020-09-23 DIAGNOSIS — E785 Hyperlipidemia, unspecified: Secondary | ICD-10-CM

## 2020-09-23 DIAGNOSIS — I10 Essential (primary) hypertension: Secondary | ICD-10-CM

## 2020-09-23 DIAGNOSIS — I2584 Coronary atherosclerosis due to calcified coronary lesion: Secondary | ICD-10-CM

## 2020-09-23 DIAGNOSIS — M25511 Pain in right shoulder: Secondary | ICD-10-CM | POA: Diagnosis not present

## 2020-09-23 DIAGNOSIS — I77810 Thoracic aortic ectasia: Secondary | ICD-10-CM | POA: Diagnosis not present

## 2020-09-23 DIAGNOSIS — I480 Paroxysmal atrial fibrillation: Secondary | ICD-10-CM

## 2020-09-23 DIAGNOSIS — I2510 Atherosclerotic heart disease of native coronary artery without angina pectoris: Secondary | ICD-10-CM

## 2020-09-23 NOTE — Patient Instructions (Signed)
Medication Instructions:  Your physician recommends that you continue on your current medications as directed. Please refer to the Current Medication list given to you today.  *If you need a refill on your cardiac medications before your next appointment, please call your pharmacy*   Lab Work: None If you have labs (blood work) drawn today and your tests are completely normal, you will receive your results only by: Marland Kitchen MyChart Message (if you have MyChart) OR . A paper copy in the mail If you have any lab test that is abnormal or we need to change your treatment, we will call you to review the results.   Testing/Procedures: None   Follow-Up: At [facility], you and your health needs are our priority.  As part of our continuing mission to provide you with exceptional heart care, we have created designated Provider Care Teams.  These Care Teams include your primary Cardiologist (physician) and Advanced Practice Providers (APPs -  Physician Assistants and Nurse Practitioners) who all work together to provide you with the care you need, when you need it.  We recommend signing up for the patient portal called "MyChart".  Sign up information is provided on this After Visit Summary.  MyChart is used to connect with patients for Virtual Visits (Telemedicine).  Patients are able to view lab/test results, encounter notes, upcoming appointments, etc.  Non-urgent messages can be sent to your provider as well.   To learn more about what you can do with MyChart, go to [email or web address].    Your next appointment:   6 month(s)  The format for your next appointment:   In Person  Provider:   Fransico Him, MD   Other Instructions Two Gram Sodium Diet 2000 mg  What is Sodium? Sodium is a mineral found naturally in many foods. The most significant source of sodium in the diet is table salt, which is about 40% sodium.  Processed, convenience, and preserved foods also contain a large amount of  sodium.  The body needs only 500 mg of sodium daily to function,  A normal diet provides more than enough sodium even if you do not use salt.  Why Limit Sodium? A build up of sodium in the body can cause thirst, increased blood pressure, shortness of breath, and water retention.  Decreasing sodium in the diet can reduce edema and risk of heart attack or stroke associated with high blood pressure.  Keep in mind that there are many other factors involved in these health problems.  Heredity, obesity, lack of exercise, cigarette smoking, stress and what you eat all play a role.  General Guidelines:  Do not add salt at the table or in cooking.  One teaspoon of salt contains over 2 grams of sodium.  Read food labels  Avoid processed and convenience foods  Ask your dietitian before eating any foods not dicussed in the menu planning guidelines  Consult your physician if you wish to use a salt substitute or a sodium containing medication such as antacids.  Limit milk and milk products to 16 oz (2 cups) per day.  Shopping Hints:  READ LABELS!! "Dietetic" does not necessarily mean low sodium.  Salt and other sodium ingredients are often added to foods during processing.   Menu Planning Guidelines Food Group Choose More Often Avoid  Beverages (see also the milk group All fruit juices, low-sodium, salt-free vegetables juices, low-sodium carbonated beverages Regular vegetable or tomato juices, commercially softened water used for drinking or cooking  Breads and Cereals Enriched  white, wheat, rye and pumpernickel bread, hard rolls and dinner rolls; muffins, cornbread and waffles; most dry cereals, cooked cereal without added salt; unsalted crackers and breadsticks; low sodium or homemade bread crumbs Bread, rolls and crackers with salted tops; quick breads; instant hot cereals; pancakes; commercial bread stuffing; self-rising flower and biscuit mixes; regular bread crumbs or cracker crumbs  Desserts and  Sweets Desserts and sweets mad with mild should be within allowance Instant pudding mixes and cake mixes  Fats Butter or margarine; vegetable oils; unsalted salad dressings, regular salad dressings limited to 1 Tbs; light, sour and heavy cream Regular salad dressings containing bacon fat, bacon bits, and salt pork; snack dips made with instant soup mixes or processed cheese; salted nuts  Fruits Most fresh, frozen and canned fruits Fruits processed with salt or sodium-containing ingredient (some dried fruits are processed with sodium sulfites        Vegetables Fresh, frozen vegetables and low- sodium canned vegetables Regular canned vegetables, sauerkraut, pickled vegetables, and others prepared in brine; frozen vegetables in sauces; vegetables seasoned with ham, bacon or salt pork  Condiments, Sauces, Miscellaneous  Salt substitute with physician's approval; pepper, herbs, spices; vinegar, lemon or lime juice; hot pepper sauce; garlic powder, onion powder, low sodium soy sauce (1 Tbs.); low sodium condiments (ketchup, chili sauce, mustard) in limited amounts (1 tsp.) fresh ground horseradish; unsalted tortilla chips, pretzels, potato chips, popcorn, salsa (1/4 cup) Any seasoning made with salt including garlic salt, celery salt, onion salt, and seasoned salt; sea salt, rock salt, kosher salt; meat tenderizers; monosodium glutamate; mustard, regular soy sauce, barbecue, sauce, chili sauce, teriyaki sauce, steak sauce, Worcestershire sauce, and most flavored vinegars; canned gravy and mixes; regular condiments; salted snack foods, olives, picles, relish, horseradish sauce, catsup   Food preparation: Try these seasonings Meats:    Pork Sage, onion Serve with applesauce  Chicken Poultry seasoning, thyme, parsley Serve with cranberry sauce  Lamb Curry powder, rosemary, garlic, thyme Serve with mint sauce or jelly  Veal Marjoram, basil Serve with current jelly, cranberry sauce  Beef Pepper, bay leaf  Serve with dry mustard, unsalted chive butter  Fish [place] leaf, dill Serve with unsalted lemon butter, unsalted parsley butter  Vegetables:    Asparagus Lemon juice   Broccoli Lemon juice   Carrots Mustard dressing parsley, mint, nutmeg, glazed with unsalted butter and sugar   Green beans Marjoram, lemon juice, nutmeg,dill seed   Tomatoes Basil, marjoram, onion   Spice /blend for [employer]" 4 tsp ground thyme 1 tsp ground sage 3 tsp ground rosemary 4 tsp ground marjoram   Test your knowledge 1. A product that says "Salt Free" may still contain sodium. True or False 2. Garlic Powder and Hot Pepper Sauce an be used as alternative seasonings.True or False 3. Processed foods have more sodium than fresh foods.  True or False 4. Canned Vegetables have less sodium than froze True or False  WAYS TO DECREASE YOUR SODIUM INTAKE 1. Avoid the use of added salt in cooking and at the table.  Table salt (and other prepared seasonings which contain salt) is probably one of the greatest sources of sodium in the diet.  Unsalted foods can gain flavor from the sweet, sour, and butter taste sensations of herbs and spices.  Instead of using salt for seasoning, try the following seasonings with the foods listed.  Remember: how you use them to enhance natural food flavors is limited only by your creativity... Allspice-Meat, fish, eggs, fruit, peas, red and  yellow vegetables Almond Extract-Fruit baked goods Anise Seed-Sweet breads, fruit, carrots, beets, cottage cheese, cookies (tastes like licorice) Basil-Meat, fish, eggs, vegetables, rice, vegetables salads, soups, sauces Bay Leaf-Meat, fish, stews, poultry Burnet-Salad, vegetables (cucumber-like flavor) Caraway Seed-Bread, cookies, cottage cheese, meat, vegetables, cheese, rice Cardamon-Baked goods, fruit, soups Celery Powder or seed-Salads, salad dressings, sauces, meatloaf, soup, bread.Do not use  celery salt Chervil-Meats, salads, fish, eggs, vegetables,  cottage cheese (parsley-like flavor) Chili Power-Meatloaf, chicken cheese, corn, eggplant, egg dishes Chives-Salads cottage cheese, egg dishes, soups, vegetables, sauces Cilantro-Salsa, casseroles Cinnamon-Baked goods, fruit, pork, lamb, chicken, carrots Cloves-Fruit, baked goods, fish, pot roast, green beans, beets, carrots Coriander-Pastry, cookies, meat, salads, cheese (lemon-orange flavor) Cumin-Meatloaf, fish,cheese, eggs, cabbage,fruit pie (caraway flavor) [employer], fruit, eggs, fish, poultry, cottage cheese, vegetables Dill Seed-Meat, cottage cheese, poultry, vegetables, fish, salads, bread Fennel Seed-Bread, cookies, apples, pork, eggs, fish, beets, cabbage, cheese, Licorice-like flavor Garlic-(buds or powder) Salads, meat, poultry, fish, bread, butter, vegetables, potatoes.Do not  use garlic salt Ginger-Fruit, vegetables, baked goods, meat, fish, poultry Horseradish Root-Meet, vegetables, butter Lemon Juice or Extract-Vegetables, fruit, tea, baked goods, fish salads Mace-Baked goods fruit, vegetables, fish, poultry (taste like nutmeg) Maple Extract-Syrups Marjoram-Meat, chicken, fish, vegetables, breads, green salads (taste like Sage) Mint-Tea, lamb, sherbet, vegetables, desserts, carrots, cabbage Mustard, Dry or Seed-Cheese, eggs, meats, vegetables, poultry Nutmeg-Baked goods, fruit, chicken, eggs, vegetables, desserts Onion Powder-Meat, fish, poultry, vegetables, cheese, eggs, bread, rice salads (Do not use   Onion salt) Orange Extract-Desserts, baked goods Oregano-Pasta, eggs, cheese, onions, pork, lamb, fish, chicken, vegetables, green salads Paprika-Meat, fish, poultry, eggs, cheese, vegetables Parsley Flakes-Butter, vegetables, meat fish, poultry, eggs, bread, salads (certain forms may   Contain sodium Pepper-Meat fish, poultry, vegetables, eggs Peppermint Extract-Desserts, baked goods Poppy Seed-Eggs, bread, cheese, fruit dressings, baked goods, noodles,  vegetables, cottage  [employer], poultry, meat, fish, cauliflower, turnips,eggs bread Saffron-Rice, bread, veal, chicken, fish, eggs Sage-Meat, fish, poultry, onions, eggplant, tomateos, pork, stews Savory-Eggs, salads, poultry, meat, rice, vegetables, soups, pork Tarragon-Meat, poultry, fish, eggs, butter, vegetables (licorice-like flavor)  Thyme-Meat, poultry, fish, eggs, vegetables, (clover-like flavor), sauces, soups Tumeric-Salads, butter, eggs, fish, rice, vegetables (saffron-like flavor) Vanilla Extract-Baked goods, candy Vinegar-Salads, vegetables, meat marinades Walnut Extract-baked goods, candy  2. Choose your Foods Wisely   The following is a list of foods to avoid which are high in sodium:  Meats-Avoid all smoked, canned, salt cured, dried and kosher meat and fish as well as Anchovies   [employer] meats:Bologna, Liverwurst, Pastrami Canned meat or fish  Marinated herring Caviar    Pepperoni Corned Beef   Pizza Dried chipped beef  Salami Frozen breaded fish or meat Salt pork Frankfurters or hot dogs  Sardines Gefilte fish   Sausage Ham (boiled ham, Proscuitto Smoked butt    spiced ham)   Spam      TV Dinners Vegetables Canned vegetables (Regular) Relish Canned mushrooms  Sauerkraut Olives    Tomato juice Pickles  Bakery and Dessert Products Canned puddings  Cream pies Cheesecake   Decorated cakes Cookies  Beverages/Juices Tomato juice, regular  Gatorade   V-8 vegetable juice, regular  Breads and Cereals Biscuit mixes   Salted potato chips, corn chips, pretzels Bread stuffing mixes  Salted crackers and rolls Pancake and waffle mixes Self-rising flour  Seasonings Accent    Meat sauces Barbecue sauce  Meat tenderizer Catsup    Monosodium glutamate (MSG) Celery salt   Onion salt Chili sauce   Prepared mustard Garlic salt   Salt,  seasoned salt, sea salt Gravy mixes   Soy sauce Horseradish   Steak  sauce Ketchup   Tartar sauce Lite salt    Teriyaki sauce Marinade mixes   Worcestershire sauce  Others Baking powder   Cocoa and cocoa mixes Baking soda   Commercial casserole mixes Candy-caramels, chocolate  Dehydrated soups    Bars, fudge,nougats  Instant rice and pasta mixes Canned broth or soup  Maraschino cherries Cheese, aged and processed cheese and cheese spreads  Learning Assessment Quiz  Indicated T (for True) or F (for False) for each of the following statements:  1. _____ Fresh fruits and vegetables and unprocessed grains are generally low in sodium 2. _____ Water may contain a considerable amount of sodium, depending on the source 3. _____ You can always tell if a food is high in sodium by tasting it 4. _____ Certain laxatives my be high in sodium and should be avoided unless prescribed   by a physician or pharmacist 5. _____ Salt substitutes may be used freely by anyone on a sodium restricted diet 6. _____ Sodium is present in table salt, food additives and as a natural component of   most foods 7. _____ Table salt is approximately 90% sodium 8. _____ Limiting sodium intake may help prevent excess fluid accumulation in the body 9. _____ On a sodium-restricted diet, seasonings such as bouillon soy sauce, and    cooking wine should be used in place of table salt 10. _____ On an ingredient list, a product which lists monosodium glutamate as the first   ingredient is an appropriate food to include on a low sodium diet  Circle the best answer(s) to the following statements (Hint: there may be more than one correct answer)  11. On a low-sodium diet, some acceptable snack items are:    A. Olives  F. Bean dip   K. Grapefruit juice    B. Salted Pretzels G. Commercial Popcorn   L. Canned peaches    C. Carrot Sticks  H. Bouillon   M. Unsalted nuts   D. [place] fries  I. Peanut butter crackers N. Salami   E. Sweet pickles J. Tomato Juice   O. Pizza  12.  Seasonings that may  be used freely on a reduced - sodium diet include   A. Lemon wedges F.Monosodium glutamate K. Celery seed    B.Soysauce   G. Pepper   L. Mustard powder   C. Sea salt  H. Cooking wine  M. Onion flakes   D. Vinegar  E. Prepared horseradish N. Salsa   E. Sage   J. Worcestershire sauce  O. Chutney

## 2020-10-12 DIAGNOSIS — R69 Illness, unspecified: Secondary | ICD-10-CM | POA: Diagnosis not present

## 2020-10-15 DIAGNOSIS — D1801 Hemangioma of skin and subcutaneous tissue: Secondary | ICD-10-CM | POA: Diagnosis not present

## 2020-10-15 DIAGNOSIS — L723 Sebaceous cyst: Secondary | ICD-10-CM | POA: Diagnosis not present

## 2020-10-15 DIAGNOSIS — L570 Actinic keratosis: Secondary | ICD-10-CM | POA: Diagnosis not present

## 2020-10-15 DIAGNOSIS — L821 Other seborrheic keratosis: Secondary | ICD-10-CM | POA: Diagnosis not present

## 2020-10-15 DIAGNOSIS — D225 Melanocytic nevi of trunk: Secondary | ICD-10-CM | POA: Diagnosis not present

## 2020-10-15 DIAGNOSIS — Z85828 Personal history of other malignant neoplasm of skin: Secondary | ICD-10-CM | POA: Diagnosis not present

## 2020-10-15 DIAGNOSIS — Z85820 Personal history of malignant melanoma of skin: Secondary | ICD-10-CM | POA: Diagnosis not present

## 2020-10-22 DIAGNOSIS — I480 Paroxysmal atrial fibrillation: Secondary | ICD-10-CM | POA: Diagnosis not present

## 2020-10-22 DIAGNOSIS — I10 Essential (primary) hypertension: Secondary | ICD-10-CM | POA: Diagnosis not present

## 2020-10-22 DIAGNOSIS — J432 Centrilobular emphysema: Secondary | ICD-10-CM | POA: Diagnosis not present

## 2020-10-22 DIAGNOSIS — D500 Iron deficiency anemia secondary to blood loss (chronic): Secondary | ICD-10-CM | POA: Diagnosis not present

## 2020-10-22 DIAGNOSIS — D509 Iron deficiency anemia, unspecified: Secondary | ICD-10-CM | POA: Diagnosis not present

## 2020-10-29 DIAGNOSIS — D509 Iron deficiency anemia, unspecified: Secondary | ICD-10-CM | POA: Diagnosis not present

## 2020-12-08 ENCOUNTER — Other Ambulatory Visit: Payer: Medicare HMO | Admitting: *Deleted

## 2020-12-08 ENCOUNTER — Other Ambulatory Visit: Payer: Self-pay

## 2020-12-08 DIAGNOSIS — E785 Hyperlipidemia, unspecified: Secondary | ICD-10-CM

## 2020-12-08 LAB — LIPID PANEL
Chol/HDL Ratio: 2.9 ratio (ref 0.0–5.0)
Cholesterol, Total: 132 mg/dL (ref 100–199)
HDL: 46 mg/dL (ref >39)
LDL Chol Calc (NIH): 75 mg/dL (ref 0–99)
Triglycerides: 50 mg/dL (ref 0–149)
VLDL Cholesterol Cal: 11 mg/dL (ref 5–40)

## 2020-12-08 LAB — ALT: ALT: 24 IU/L (ref 0–44)

## 2020-12-08 NOTE — Progress Notes (Signed)
Orders for FLP and ALT placed. Patient is here for repeat lab work after starting rosuvastatin 10 mg daily.

## 2020-12-09 DIAGNOSIS — N210 Calculus in bladder: Secondary | ICD-10-CM | POA: Diagnosis not present

## 2020-12-09 DIAGNOSIS — N200 Calculus of kidney: Secondary | ICD-10-CM | POA: Diagnosis not present

## 2020-12-09 DIAGNOSIS — D649 Anemia, unspecified: Secondary | ICD-10-CM | POA: Diagnosis not present

## 2020-12-09 DIAGNOSIS — R972 Elevated prostate specific antigen [PSA]: Secondary | ICD-10-CM | POA: Diagnosis not present

## 2020-12-09 DIAGNOSIS — N5201 Erectile dysfunction due to arterial insufficiency: Secondary | ICD-10-CM | POA: Diagnosis not present

## 2020-12-10 ENCOUNTER — Telehealth: Payer: Self-pay | Admitting: Cardiology

## 2020-12-10 DIAGNOSIS — Z79899 Other long term (current) drug therapy: Secondary | ICD-10-CM

## 2020-12-10 DIAGNOSIS — I2510 Atherosclerotic heart disease of native coronary artery without angina pectoris: Secondary | ICD-10-CM

## 2020-12-10 DIAGNOSIS — E785 Hyperlipidemia, unspecified: Secondary | ICD-10-CM

## 2020-12-10 DIAGNOSIS — E782 Mixed hyperlipidemia: Secondary | ICD-10-CM

## 2020-12-10 NOTE — Telephone Encounter (Signed)
Spoke with the pt and endorsed to him, his lab results and recommendations per Dr. Radford Pax.  Pt wanted to let Dr. Radford Pax know that he has not been taking his crestor 10 mg po daily as he should have, and has truly only been taking this for the past week. Pt states he had some orthopedic issues for the last several months and ended up on prednisone, so he decided to hold off on his statin regimen at that time, until his orthopedic injury improved.  He states he started back resuming his statin about a week ago, and promises to start back taking this as prescribed with no more skipped doses.  Pt states he wants to stay on his crestor 10 mg po daily and have his lipids/LFTs checked in 6 weeks, when he comes back to see Dr. Radford Pax on 01/26/21.  Pt states he will do better with his diet and exercise regimen, and start back taking his crestor 10 mg.  Pt states if at that time when he has lab rechecked, if this is not better, he will then proceed with recommendations from Dr. Radford Pax, to increase his crestor to 20 mg po daily. Scheduled the pt to come back in and have labs drawn same day he see's Dr. Radford Pax in 6 weeks, for 01/26/21.  He is aware to come fasting.  Informed him that I will make Dr. Radford Pax and her RN aware of this matter.  Pt verbalized understanding and agrees with this plan.

## 2020-12-10 NOTE — Telephone Encounter (Signed)
Follow up:     Patient returning a call back concering results.

## 2020-12-10 NOTE — Telephone Encounter (Signed)
-----   Message from Sueanne Margarita, MD sent at 12/10/2020 12:16 PM EST ----- Would like to see LDL < 70.  Please increase crestor to 20mg  daily and repeat FLP and ALT in 6 weeks

## 2020-12-23 DIAGNOSIS — M791 Myalgia, unspecified site: Secondary | ICD-10-CM | POA: Diagnosis not present

## 2021-01-14 DIAGNOSIS — M791 Myalgia, unspecified site: Secondary | ICD-10-CM | POA: Diagnosis not present

## 2021-01-14 DIAGNOSIS — D649 Anemia, unspecified: Secondary | ICD-10-CM | POA: Diagnosis not present

## 2021-01-14 DIAGNOSIS — I10 Essential (primary) hypertension: Secondary | ICD-10-CM | POA: Diagnosis not present

## 2021-01-25 ENCOUNTER — Other Ambulatory Visit: Payer: Medicare HMO

## 2021-01-26 ENCOUNTER — Other Ambulatory Visit: Payer: Medicare HMO

## 2021-01-26 ENCOUNTER — Ambulatory Visit: Payer: Medicare HMO | Admitting: Cardiology

## 2021-01-30 DIAGNOSIS — Z20822 Contact with and (suspected) exposure to covid-19: Secondary | ICD-10-CM | POA: Diagnosis not present

## 2021-01-30 DIAGNOSIS — Z03818 Encounter for observation for suspected exposure to other biological agents ruled out: Secondary | ICD-10-CM | POA: Diagnosis not present

## 2021-02-18 DIAGNOSIS — Z20822 Contact with and (suspected) exposure to covid-19: Secondary | ICD-10-CM | POA: Diagnosis not present

## 2021-02-22 DIAGNOSIS — I480 Paroxysmal atrial fibrillation: Secondary | ICD-10-CM | POA: Diagnosis not present

## 2021-02-22 DIAGNOSIS — J432 Centrilobular emphysema: Secondary | ICD-10-CM | POA: Diagnosis not present

## 2021-02-22 DIAGNOSIS — I10 Essential (primary) hypertension: Secondary | ICD-10-CM | POA: Diagnosis not present

## 2021-02-22 DIAGNOSIS — L820 Inflamed seborrheic keratosis: Secondary | ICD-10-CM | POA: Diagnosis not present

## 2021-02-22 DIAGNOSIS — D509 Iron deficiency anemia, unspecified: Secondary | ICD-10-CM | POA: Diagnosis not present

## 2021-02-22 DIAGNOSIS — K219 Gastro-esophageal reflux disease without esophagitis: Secondary | ICD-10-CM | POA: Diagnosis not present

## 2021-02-22 DIAGNOSIS — D500 Iron deficiency anemia secondary to blood loss (chronic): Secondary | ICD-10-CM | POA: Diagnosis not present

## 2021-02-22 DIAGNOSIS — D485 Neoplasm of uncertain behavior of skin: Secondary | ICD-10-CM | POA: Diagnosis not present

## 2021-02-23 ENCOUNTER — Other Ambulatory Visit: Payer: Self-pay

## 2021-02-23 ENCOUNTER — Ambulatory Visit: Payer: Medicare HMO | Admitting: Cardiology

## 2021-02-23 ENCOUNTER — Encounter: Payer: Self-pay | Admitting: Cardiology

## 2021-02-23 VITALS — BP 120/80 | HR 70 | Ht 76.5 in | Wt 231.0 lb

## 2021-02-23 DIAGNOSIS — I480 Paroxysmal atrial fibrillation: Secondary | ICD-10-CM | POA: Diagnosis not present

## 2021-02-23 DIAGNOSIS — I10 Essential (primary) hypertension: Secondary | ICD-10-CM | POA: Diagnosis not present

## 2021-02-23 NOTE — Progress Notes (Signed)
Cardiology Office Note:    Date:  02/23/2021   ID:  David Vargas, DOB January 06, 1951, MRN [id number]  PCP:  Hulan Fess, MD  Cardiologist:  Fransico Him, MD    Referring MD: Hulan Fess, MD   Chief Complaint  Patient presents with  . Atrial Fibrillation  . Hypertension    History of Present Illness:    David Vargas is a 70 y.o. male with a hx of PAF that occurred once with a kidney stone and he has been treated with aspirin. He also has hypertension, hyperlipidemia and mildly dilated aortic root. Last echo in 2015 was normal LVEF 60-65% with trivial TR and normal aortic root. Cardiac catheterization in 2005 for abnormal nuclear study showed no significant CAD echo probable intramyocardial mid LAD segment with minimal systolic depression.  He is here today for followup and is doing well.  Since I saw him last summer he had He had a prostate bx complicated by urosepsis and then 3 days later developed coffee ground emesis with weakness and dizziness and found to be in afib with RVR and hypotension and Hbg dropped from 14 to 9 and got a transfusion. Cardiology was consulted for Afib and underwent DCCV x 3 in ER. It was felt he had a Mallory Weiss tear and had 2 endo clips placed and also had a duodenal ulcer.  He was started on IV Amio and discharged home on PO Amio 200mg  daily for 21 days and then stopped it.  He was not anticoagulated due to [facility] score of 1.  He denies any chest pain or pressure, SOB, DOE, PND, orthopnea, LE edema, dizziness, palpitations or syncope. He3his is compliant with his meds and is tolerating meds with no SE.    Past Medical History:  Diagnosis Date  . Arthritis   . Basal cell carcinoma    followed by dermatology  . Cancer ([place])    melanoma - left shoulder  . Complication of anesthesia    slow to wake up  . GERD (gastroesophageal reflux disease)   . H/O colonoscopy    2009, polypoid colorectal mucosa found, Dr. Oletta Lamas advised repeat in 3 years  .  Hiatal hernia    upper GI in 2003  . History of kidney stones   . History of nuclear stress test 2005   abnormal, cardiac cath done   . Hypertension   . PAF (paroxysmal atrial fibrillation) ([place])    he has had 2 episodes in the past one associated with kidney stone and the other urosepsis.  CHA2DS2/VASC score 1 (age > 57 >>he has remote hx of HTN but this resolved after weight loss) therefore will not anticoagulate    Past Surgical History:  Procedure Laterality Date  . BIOPSY PROSTATE  08/03/2020  . CARDIAC CATHETERIZATION  2005   no evidence of significamt atherosclerosis  . ESOPHAGOGASTRODUODENOSCOPY (EGD) WITH PROPOFOL N/A 08/11/2020   Procedure: ESOPHAGOGASTRODUODENOSCOPY (EGD) WITH PROPOFOL;  Surgeon: Ronnette Juniper, MD;  Location: [facility];  Service: Gastroenterology;  Laterality: N/A;  . ESOPHAGOGASTRODUODENOSCOPY (EGD) WITH PROPOFOL N/A 08/15/2020   Procedure: ESOPHAGOGASTRODUODENOSCOPY (EGD) WITH PROPOFOL;  Surgeon: Ronald Lobo, MD;  Location: [facility];  Service: Endoscopy;  Laterality: N/A;  . EXTRACORPOREAL SHOCK WAVE LITHOTRIPSY Right 02/17/2020   Procedure: EXTRACORPOREAL SHOCK WAVE LITHOTRIPSY (ESWL);  Surgeon: Irine Seal, MD;  Location: [facility];  Service: Urology;  Laterality: Right;  . HEMOSTASIS CLIP PLACEMENT  08/11/2020   Procedure: HEMOSTASIS CLIP PLACEMENT;  Surgeon: Ronnette Juniper, MD;  Location: [facility];  Service: Gastroenterology;;  . HEMOSTASIS CONTROL  08/11/2020   Procedure: HEMOSTASIS CONTROL;  Surgeon: Ronnette Juniper, MD;  Location: [place];  Service: Gastroenterology;;  . HERNIA REPAIR     right groin  . KNEE SURGERY     left 1984, right 2010  Arthroscopic  . SHOULDER SURGERY Right    rotator cuff repair  . TOTAL KNEE ARTHROPLASTY Left 11/28/2017   Procedure: TOTAL KNEE ARTHROPLASTY;  Surgeon: Renette Butters, MD;  Location: [facility];  Service: Orthopedics;  Laterality: Left;    Current Medications: Current Meds  Medication  Sig  . acetaminophen (TYLENOL) 325 MG tablet Take 650 mg by mouth every 6 (six) hours as needed for mild pain or fever.  . famotidine (PEPCID) 20 MG tablet Take 20 mg by mouth daily.  . Melatonin 5 MG TABS Take 5 mg by mouth at bedtime as needed (for sleep).   . pantoprazole (PROTONIX) 40 MG tablet Take 1 tablet (40 mg total) by mouth daily.  . tadalafil (CIALIS) 10 MG tablet Take 5 mg by mouth daily as needed for erectile dysfunction.   . tamsulosin (FLOMAX) 0.4 MG CAPS capsule Take 0.4 mg by mouth as needed. For kidney stones     Allergies:   Atorvastatin and Simvastatin   Social History   Socioeconomic History  . Marital status: Married    Spouse name: Not on file  . Number of children: Not on file  . Years of education: Not on file  . Highest education level: Not on file  Occupational History  . Not on file  Tobacco Use  . Smoking status: Never Smoker  . Smokeless tobacco: Never Used  Vaping Use  . Vaping Use: Never used  Substance and Sexual Activity  . Alcohol use: Yes    Comment: few times a week  . Drug use: No  . Sexual activity: Not on file  Other Topics Concern  . Not on file  Social History Narrative  . Not on file   Social Determinants of Health   Financial Resource Strain: Not on file  Food Insecurity: Not on file  Transportation Needs: Not on file  Physical Activity: Not on file  Stress: Not on file  Social Connections: Not on file     Family History: The patient's family history includes Heart attack in his mother; Liver cancer in his father; Multiple sclerosis in his sister.  ROS:   Please see the history of present illness.    ROS  All other systems reviewed and negative.   EKGs/Labs/Other Studies Reviewed:    The following studies were reviewed today:  echo, EKG  EKG:  EKG is not ordered today.   Recent Labs: 08/12/2020: Magnesium 2.3 08/14/2020: Platelets 186 08/15/2020: BUN 18; Creatinine, Ser 0.93; Hemoglobin 7.5; Potassium 3.6;  Sodium 140 12/08/2020: ALT 24   Recent Lipid Panel    Component Value Date/Time   CHOL 132 12/08/2020 0812   TRIG 50 12/08/2020 0812   HDL 46 12/08/2020 0812   CHOLHDL 2.9 12/08/2020 0812   LDLCALC 75 12/08/2020 0812    Physical Exam:    VS:  BP 120/80   Pulse 70   Ht 6' 4.5" (1.943 m)   Wt 231 lb (104.8 kg)   SpO2 96%   BMI 27.75 kg/m     Wt Readings from Last 3 Encounters:  02/23/21 231 lb (104.8 kg)  09/23/20 223 lb 12.8 oz (101.5 kg)  08/11/20 227 lb 11.8 oz (103.3 kg)  GEN: Well nourished, well developed in no acute distress HEENT: Normal NECK: No JVD; No carotid bruits LYMPHATICS: No lymphadenopathy CARDIAC:RRR, no murmurs, rubs, gallops RESPIRATORY:  Clear to auscultation without rales, wheezing or rhonchi  ABDOMEN: Soft, non-tender, non-distended MUSCULOSKELETAL:  No edema; No deformity  SKIN: Warm and dry NEUROLOGIC:  Alert and oriented x 3 PSYCHIATRIC:  Normal affect    ASSESSMENT:    1. PAF (paroxysmal atrial fibrillation) ([place])   2. Essential hypertension, benign    PLAN:    In order of problems listed above:  1.  PAF -he continues to maintain NSR with no palpitations -His CHADS2VASC score is 2 but he has only had one episode of A. Fib in the setting of a kidney stone and he has had no further episodes.  Was likely triggered by increased catecholamine surge with pain from his kidney stone -he will let me know if he develops any palpitations -CHA2DS2/VASC score 1 (age > 71 >>he has remote hx of HTN but this resolved after weight loss) therefore will not anticoagulate  2.  Hx of remote HTN -resolved after significant weight loss and has no further HTN   Medication Adjustments/Labs and Tests Ordered: Current medicines are reviewed at length with the patient today.  Concerns regarding medicines are outlined above.  No orders of the defined types were placed in this encounter.  No orders of the defined types were placed in this  encounter.   Signed, Fransico Him, MD  02/23/2021 2:06 PM    [place]

## 2021-02-23 NOTE — Patient Instructions (Signed)

## 2021-04-02 DIAGNOSIS — I480 Paroxysmal atrial fibrillation: Secondary | ICD-10-CM | POA: Diagnosis not present

## 2021-04-02 DIAGNOSIS — D509 Iron deficiency anemia, unspecified: Secondary | ICD-10-CM | POA: Diagnosis not present

## 2021-04-02 DIAGNOSIS — I10 Essential (primary) hypertension: Secondary | ICD-10-CM | POA: Diagnosis not present

## 2021-04-02 DIAGNOSIS — K4090 Unilateral inguinal hernia, without obstruction or gangrene, not specified as recurrent: Secondary | ICD-10-CM | POA: Diagnosis not present

## 2021-04-07 DIAGNOSIS — K4090 Unilateral inguinal hernia, without obstruction or gangrene, not specified as recurrent: Secondary | ICD-10-CM | POA: Diagnosis not present

## 2021-04-14 DIAGNOSIS — K219 Gastro-esophageal reflux disease without esophagitis: Secondary | ICD-10-CM | POA: Diagnosis not present

## 2021-04-14 DIAGNOSIS — I10 Essential (primary) hypertension: Secondary | ICD-10-CM | POA: Diagnosis not present

## 2021-04-14 DIAGNOSIS — E785 Hyperlipidemia, unspecified: Secondary | ICD-10-CM | POA: Diagnosis not present

## 2021-04-14 DIAGNOSIS — J432 Centrilobular emphysema: Secondary | ICD-10-CM | POA: Diagnosis not present

## 2021-04-14 DIAGNOSIS — D509 Iron deficiency anemia, unspecified: Secondary | ICD-10-CM | POA: Diagnosis not present

## 2021-04-14 DIAGNOSIS — I480 Paroxysmal atrial fibrillation: Secondary | ICD-10-CM | POA: Diagnosis not present

## 2021-04-20 DIAGNOSIS — L218 Other seborrheic dermatitis: Secondary | ICD-10-CM | POA: Diagnosis not present

## 2021-04-20 DIAGNOSIS — D1801 Hemangioma of skin and subcutaneous tissue: Secondary | ICD-10-CM | POA: Diagnosis not present

## 2021-04-20 DIAGNOSIS — D225 Melanocytic nevi of trunk: Secondary | ICD-10-CM | POA: Diagnosis not present

## 2021-04-20 DIAGNOSIS — Z85820 Personal history of malignant melanoma of skin: Secondary | ICD-10-CM | POA: Diagnosis not present

## 2021-04-20 DIAGNOSIS — D2261 Melanocytic nevi of right upper limb, including shoulder: Secondary | ICD-10-CM | POA: Diagnosis not present

## 2021-04-20 DIAGNOSIS — D2262 Melanocytic nevi of left upper limb, including shoulder: Secondary | ICD-10-CM | POA: Diagnosis not present

## 2021-04-20 DIAGNOSIS — D2272 Melanocytic nevi of left lower limb, including hip: Secondary | ICD-10-CM | POA: Diagnosis not present

## 2021-04-20 DIAGNOSIS — Z85828 Personal history of other malignant neoplasm of skin: Secondary | ICD-10-CM | POA: Diagnosis not present

## 2021-04-20 DIAGNOSIS — D2271 Melanocytic nevi of right lower limb, including hip: Secondary | ICD-10-CM | POA: Diagnosis not present

## 2021-04-20 DIAGNOSIS — L570 Actinic keratosis: Secondary | ICD-10-CM | POA: Diagnosis not present

## 2021-05-14 DIAGNOSIS — E785 Hyperlipidemia, unspecified: Secondary | ICD-10-CM | POA: Diagnosis not present

## 2021-05-14 DIAGNOSIS — I480 Paroxysmal atrial fibrillation: Secondary | ICD-10-CM | POA: Diagnosis not present

## 2021-05-14 DIAGNOSIS — K219 Gastro-esophageal reflux disease without esophagitis: Secondary | ICD-10-CM | POA: Diagnosis not present

## 2021-05-14 DIAGNOSIS — D509 Iron deficiency anemia, unspecified: Secondary | ICD-10-CM | POA: Diagnosis not present

## 2021-05-14 DIAGNOSIS — I10 Essential (primary) hypertension: Secondary | ICD-10-CM | POA: Diagnosis not present

## 2021-05-14 DIAGNOSIS — J432 Centrilobular emphysema: Secondary | ICD-10-CM | POA: Diagnosis not present

## 2021-05-14 DIAGNOSIS — D500 Iron deficiency anemia secondary to blood loss (chronic): Secondary | ICD-10-CM | POA: Diagnosis not present

## 2021-06-01 DIAGNOSIS — R972 Elevated prostate specific antigen [PSA]: Secondary | ICD-10-CM | POA: Diagnosis not present

## 2021-06-07 DIAGNOSIS — N200 Calculus of kidney: Secondary | ICD-10-CM | POA: Diagnosis not present

## 2021-06-07 DIAGNOSIS — R972 Elevated prostate specific antigen [PSA]: Secondary | ICD-10-CM | POA: Diagnosis not present

## 2021-06-07 DIAGNOSIS — N5201 Erectile dysfunction due to arterial insufficiency: Secondary | ICD-10-CM | POA: Diagnosis not present

## 2021-06-07 DIAGNOSIS — N210 Calculus in bladder: Secondary | ICD-10-CM | POA: Diagnosis not present

## 2021-06-17 DIAGNOSIS — K4090 Unilateral inguinal hernia, without obstruction or gangrene, not specified as recurrent: Secondary | ICD-10-CM | POA: Diagnosis not present

## 2021-06-22 ENCOUNTER — Encounter (HOSPITAL_BASED_OUTPATIENT_CLINIC_OR_DEPARTMENT_OTHER): Payer: Self-pay | Admitting: Surgery

## 2021-06-22 ENCOUNTER — Other Ambulatory Visit: Payer: Self-pay

## 2021-06-22 DIAGNOSIS — K4090 Unilateral inguinal hernia, without obstruction or gangrene, not specified as recurrent: Secondary | ICD-10-CM

## 2021-06-22 HISTORY — DX: Unilateral inguinal hernia, without obstruction or gangrene, not specified as recurrent: K40.90

## 2021-06-22 NOTE — Progress Notes (Signed)
Spoke w/ via phone for pre-op interview---pt Lab needs dos----  I stat             Lab results------lov dr [facility] cardiology 02-23-2021 epic, ekg 08-11-2020 epic, chest xray 1 view 08-12-2020 epic COVID test -----patient states asymptomatic no test needed Arrive at ------ 530 am -06-24-2021 NPO after MN NO Solid Food.  Clear liquids from MN until---430 am then npo Med rec completed Medications to take morning of surgery -----pantaprazole Diabetic medication -----n/a Patient instructed no nail polish to be worn day of surgery Patient instructed to bring photo id and insurance card day of surgery Patient aware to have Driver (ride ) / caregiver   wife linda will stay  for 24 hours after surgery  Patient Special Instructions -----none Pre-Op special Istructions -----none Patient verbalized understanding of instructions that were given at this phone interview. Patient denies shortness of breath, chest pain, fever, cough at this phone interview.

## 2021-06-23 ENCOUNTER — Ambulatory Visit: Payer: Self-pay | Admitting: Surgery

## 2021-06-23 NOTE — Anesthesia Preprocedure Evaluation (Addendum)
Anesthesia Evaluation  Patient identified by MRN, date of birth, ID band Patient awake    Reviewed: Allergy & Precautions, H&P , NPO status , Patient's Chart, lab work & pertinent test results  History of Anesthesia Complications Negative for: history of anesthetic complications  Airway Mallampati: III  TM Distance: >3 FB Neck ROM: Limited    Dental no notable dental hx. (+) Dental Advisory Given   Pulmonary neg pulmonary ROS,    Pulmonary exam normal        Cardiovascular hypertension, Normal cardiovascular exam+ dysrhythmias Atrial Fibrillation   Study Conclusions   - Left ventricle: The cavity size was normal. There was moderate  concentric hypertrophy. Systolic function was normal. The  estimated ejection fraction was in the range of 60% to 65%. Wall  motion was normal; there were no regional wall motion  abnormalities. Left ventricular diastolic function parameters  were normal.  - Tricuspid valve: There was trivial regurgitation.  - Pulmonary arteries: Systolic pressure was within the normal  range.    Neuro/Psych negative neurological ROS  negative psych ROS   GI/Hepatic Neg liver ROS, hiatal hernia, GERD  Medicated,  Endo/Other  negative endocrine ROS  Renal/GU negative Renal ROS  negative genitourinary   Musculoskeletal  (+) Arthritis ,   Abdominal   Peds  Hematology  (+) Blood dyscrasia, anemia ,   Anesthesia Other Findings   Reproductive/Obstetrics negative OB ROS                            Anesthesia Physical  Anesthesia Plan  ASA: 3  Anesthesia Plan: General   Post-op Pain Management:    Induction: Intravenous  PONV Risk Score and Plan: 3 and Ondansetron, Dexamethasone, Scopolamine patch - Pre-op and Diphenhydramine  Airway Management Planned: Oral ETT  Additional Equipment:   Intra-op Plan:   Post-operative Plan: Extubation in OR  Informed  Consent: I have reviewed the patients History and Physical, chart, labs and discussed the procedure including the risks, benefits and alternatives for the proposed anesthesia with the patient or authorized representative who has indicated his/her understanding and acceptance.     Dental advisory given  Plan Discussed with: Anesthesiologist and CRNA  Anesthesia Plan Comments:        Anesthesia Quick Evaluation

## 2021-06-24 ENCOUNTER — Ambulatory Visit (HOSPITAL_BASED_OUTPATIENT_CLINIC_OR_DEPARTMENT_OTHER)
Admission: RE | Admit: 2021-06-24 | Discharge: 2021-06-24 | Disposition: A | Discharge status: Home / Self Care | Payer: Medicare HMO | Attending: Surgery | Admitting: Surgery

## 2021-06-24 ENCOUNTER — Ambulatory Visit (HOSPITAL_BASED_OUTPATIENT_CLINIC_OR_DEPARTMENT_OTHER): Payer: Medicare HMO | Admitting: Anesthesiology

## 2021-06-24 ENCOUNTER — Encounter (HOSPITAL_BASED_OUTPATIENT_CLINIC_OR_DEPARTMENT_OTHER)
Admission: RE | Disposition: A | Discharge status: Home / Self Care | Payer: Self-pay | Source: Home / Self Care | Attending: Surgery

## 2021-06-24 ENCOUNTER — Other Ambulatory Visit: Payer: Self-pay

## 2021-06-24 ENCOUNTER — Encounter (HOSPITAL_BASED_OUTPATIENT_CLINIC_OR_DEPARTMENT_OTHER): Payer: Self-pay | Admitting: Surgery

## 2021-06-24 DIAGNOSIS — Z79899 Other long term (current) drug therapy: Secondary | ICD-10-CM | POA: Diagnosis not present

## 2021-06-24 DIAGNOSIS — Z85820 Personal history of malignant melanoma of skin: Secondary | ICD-10-CM | POA: Insufficient documentation

## 2021-06-24 DIAGNOSIS — I10 Essential (primary) hypertension: Secondary | ICD-10-CM | POA: Diagnosis not present

## 2021-06-24 DIAGNOSIS — Z888 Allergy status to other drugs, medicaments and biological substances status: Secondary | ICD-10-CM | POA: Diagnosis not present

## 2021-06-24 DIAGNOSIS — K4090 Unilateral inguinal hernia, without obstruction or gangrene, not specified as recurrent: Secondary | ICD-10-CM | POA: Diagnosis not present

## 2021-06-24 DIAGNOSIS — D62 Acute posthemorrhagic anemia: Secondary | ICD-10-CM | POA: Diagnosis not present

## 2021-06-24 DIAGNOSIS — E785 Hyperlipidemia, unspecified: Secondary | ICD-10-CM | POA: Diagnosis not present

## 2021-06-24 HISTORY — PX: INGUINAL HERNIA REPAIR: SHX194

## 2021-06-24 HISTORY — DX: Personal history of other medical treatment: Z92.89

## 2021-06-24 LAB — POCT I-STAT, CHEM 8
BUN: 28 mg/dL — ABNORMAL HIGH (ref 8–23)
Calcium, Ion: 1.23 mmol/L (ref 1.15–1.40)
Chloride: 104 mmol/L (ref 98–111)
Creatinine, Ser: 0.9 mg/dL (ref 0.61–1.24)
Glucose, Bld: 92 mg/dL (ref 70–99)
HCT: 47 % (ref 39.0–52.0)
Hemoglobin: 16 g/dL (ref 13.0–17.0)
Potassium: 4.5 mmol/L (ref 3.5–5.1)
Sodium: 142 mmol/L (ref 135–145)
TCO2: 27 mmol/L (ref 22–32)

## 2021-06-24 SURGERY — REPAIR, HERNIA, INGUINAL, LAPAROSCOPIC
Anesthesia: General | Site: Abdomen | Laterality: Left

## 2021-06-24 MED ORDER — GABAPENTIN 300 MG PO CAPS
ORAL_CAPSULE | ORAL | Status: AC
  Filled 2021-06-24: qty 1

## 2021-06-24 MED ORDER — FENTANYL CITRATE (PF) 100 MCG/2ML IJ SOLN
25.0000 ug | INTRAMUSCULAR | Status: DC | PRN
  Administered 2021-06-24: 25 ug via INTRAVENOUS

## 2021-06-24 MED ORDER — BUPIVACAINE LIPOSOME 1.3 % IJ SUSP
INTRAMUSCULAR | Status: DC | PRN
  Administered 2021-06-24: 20 mL

## 2021-06-24 MED ORDER — ROCURONIUM BROMIDE 10 MG/ML (PF) SYRINGE
PREFILLED_SYRINGE | INTRAVENOUS | Status: AC
  Filled 2021-06-24: qty 10

## 2021-06-24 MED ORDER — CHLORHEXIDINE GLUCONATE CLOTH 2 % EX PADS: 6.0000 | MEDICATED_PAD | Freq: Once | CUTANEOUS | Status: DC

## 2021-06-24 MED ORDER — CELECOXIB 200 MG PO CAPS
200.0000 mg | ORAL_CAPSULE | Freq: Once | ORAL | Status: AC
  Administered 2021-06-24: 200 mg via ORAL

## 2021-06-24 MED ORDER — BUPIVACAINE LIPOSOME 1.3 % IJ SUSP: 20.0000 mL | Freq: Once | INTRAMUSCULAR | Status: DC

## 2021-06-24 MED ORDER — FENTANYL CITRATE (PF) 100 MCG/2ML IJ SOLN
INTRAMUSCULAR | Status: AC
  Filled 2021-06-24: qty 2

## 2021-06-24 MED ORDER — BUPIVACAINE HCL 0.5 % IJ SOLN
INTRAMUSCULAR | Status: DC | PRN
  Administered 2021-06-24: 30 mL

## 2021-06-24 MED ORDER — GABAPENTIN 300 MG PO CAPS
300.0000 mg | ORAL_CAPSULE | ORAL | Status: AC
  Administered 2021-06-24: 300 mg via ORAL

## 2021-06-24 MED ORDER — FENTANYL CITRATE (PF) 250 MCG/5ML IJ SOLN
INTRAMUSCULAR | Status: AC
  Filled 2021-06-24: qty 5

## 2021-06-24 MED ORDER — SUGAMMADEX SODIUM 200 MG/2ML IV SOLN
INTRAVENOUS | Status: DC | PRN
  Administered 2021-06-24: 200 mg via INTRAVENOUS
  Administered 2021-06-24: 100 mg via INTRAVENOUS

## 2021-06-24 MED ORDER — OXYCODONE HCL 5 MG PO TABS
ORAL_TABLET | ORAL | Status: AC
  Filled 2021-06-24: qty 1

## 2021-06-24 MED ORDER — ACETAMINOPHEN 500 MG PO TABS: 1000.0000 mg | ORAL_TABLET | ORAL | Status: DC

## 2021-06-24 MED ORDER — DEXAMETHASONE SODIUM PHOSPHATE 4 MG/ML IJ SOLN
INTRAMUSCULAR | Status: DC | PRN
  Administered 2021-06-24: 8 mg via INTRAVENOUS

## 2021-06-24 MED ORDER — CEFAZOLIN SODIUM-DEXTROSE 2-4 GM/100ML-% IV SOLN
INTRAVENOUS | Status: AC
  Filled 2021-06-24: qty 100

## 2021-06-24 MED ORDER — CEFAZOLIN SODIUM-DEXTROSE 2-4 GM/100ML-% IV SOLN
2.0000 g | INTRAVENOUS | Status: AC
  Administered 2021-06-24: 2 g via INTRAVENOUS

## 2021-06-24 MED ORDER — FENTANYL CITRATE (PF) 100 MCG/2ML IJ SOLN
INTRAMUSCULAR | Status: DC | PRN
  Administered 2021-06-24: 25 ug via INTRAVENOUS
  Administered 2021-06-24: 50 ug via INTRAVENOUS
  Administered 2021-06-24: 25 ug via INTRAVENOUS
  Administered 2021-06-24: 50 ug via INTRAVENOUS

## 2021-06-24 MED ORDER — SUGAMMADEX SODIUM 200 MG/2ML IV SOLN: INTRAVENOUS | Status: DC | PRN

## 2021-06-24 MED ORDER — AMISULPRIDE (ANTIEMETIC) 5 MG/2ML IV SOLN
INTRAVENOUS | Status: AC
  Filled 2021-06-24: qty 4

## 2021-06-24 MED ORDER — LACTATED RINGERS IV SOLN: INTRAVENOUS | Status: DC

## 2021-06-24 MED ORDER — ONDANSETRON HCL 4 MG/2ML IJ SOLN
INTRAMUSCULAR | Status: AC
  Filled 2021-06-24: qty 2

## 2021-06-24 MED ORDER — LIDOCAINE HCL (CARDIAC) PF 100 MG/5ML IV SOSY
PREFILLED_SYRINGE | INTRAVENOUS | Status: DC | PRN
  Administered 2021-06-24: 100 mg via INTRAVENOUS

## 2021-06-24 MED ORDER — OXYCODONE HCL 5 MG PO TABS
5.0000 mg | ORAL_TABLET | ORAL | Status: DC | PRN
  Administered 2021-06-24: 5 mg via ORAL

## 2021-06-24 MED ORDER — PROPOFOL 10 MG/ML IV BOLUS
INTRAVENOUS | Status: DC | PRN
  Administered 2021-06-24: 200 mg via INTRAVENOUS

## 2021-06-24 MED ORDER — ACETAMINOPHEN 500 MG PO TABS
1000.0000 mg | ORAL_TABLET | Freq: Once | ORAL | Status: AC
  Administered 2021-06-24: 1000 mg via ORAL

## 2021-06-24 MED ORDER — MIDAZOLAM HCL 2 MG/2ML IJ SOLN
INTRAMUSCULAR | Status: AC
  Filled 2021-06-24: qty 2

## 2021-06-24 MED ORDER — DEXAMETHASONE SODIUM PHOSPHATE 10 MG/ML IJ SOLN
INTRAMUSCULAR | Status: AC
  Filled 2021-06-24: qty 1

## 2021-06-24 MED ORDER — PROPOFOL 10 MG/ML IV BOLUS
INTRAVENOUS | Status: AC
  Filled 2021-06-24: qty 40

## 2021-06-24 MED ORDER — CELECOXIB 200 MG PO CAPS
ORAL_CAPSULE | ORAL | Status: AC
  Filled 2021-06-24: qty 1

## 2021-06-24 MED ORDER — PROMETHAZINE HCL 25 MG/ML IJ SOLN: 6.2500 mg | INTRAMUSCULAR | Status: DC | PRN

## 2021-06-24 MED ORDER — ACETAMINOPHEN 500 MG PO TABS
ORAL_TABLET | ORAL | Status: AC
  Filled 2021-06-24: qty 2

## 2021-06-24 MED ORDER — AMISULPRIDE (ANTIEMETIC) 5 MG/2ML IV SOLN
10.0000 mg | Freq: Once | INTRAVENOUS | Status: AC | PRN
  Administered 2021-06-24: 10 mg via INTRAVENOUS

## 2021-06-24 MED ORDER — OXYCODONE-ACETAMINOPHEN 5-325 MG PO TABS: 1.0000 | ORAL_TABLET | ORAL | 0 refills | Status: AC | PRN

## 2021-06-24 MED ORDER — ROCURONIUM BROMIDE 100 MG/10ML IV SOLN
INTRAVENOUS | Status: DC | PRN
  Administered 2021-06-24: 95 mg via INTRAVENOUS

## 2021-06-24 MED ORDER — ONDANSETRON HCL 4 MG/2ML IJ SOLN
INTRAMUSCULAR | Status: DC | PRN
  Administered 2021-06-24: 4 mg via INTRAVENOUS

## 2021-06-24 SURGICAL SUPPLY — 36 items
ADH SKN CLS APL DERMABOND .7 (GAUZE/BANDAGES/DRESSINGS) ×1
APL PRP STRL LF DISP 70% ISPRP (MISCELLANEOUS) ×1
BLADE CLIPPER SENSICLIP SURGIC (BLADE) ×2 IMPLANT
CABLE HIGH FREQUENCY MONO STRZ (ELECTRODE) ×2 IMPLANT
CHLORAPREP W/TINT 26 (MISCELLANEOUS) ×2 IMPLANT
DECANTER SPIKE VIAL GLASS SM (MISCELLANEOUS) IMPLANT
DERMABOND ADVANCED (GAUZE/BANDAGES/DRESSINGS) ×1
DERMABOND ADVANCED .7 DNX12 (GAUZE/BANDAGES/DRESSINGS) ×1 IMPLANT
ELECT REM PT RETURN 9FT ADLT (ELECTROSURGICAL) ×2
ELECTRODE REM PT RTRN 9FT ADLT (ELECTROSURGICAL) ×1 IMPLANT
GAUZE 4X4 16PLY ~~LOC~~+RFID DBL (SPONGE) ×2 IMPLANT
GLOVE SURG ENC MOIS LTX SZ7 (GLOVE) ×2 IMPLANT
GLOVE SURG UNDER POLY LF SZ7.5 (GLOVE) ×2 IMPLANT
GOWN STRL REUS W/ TWL XL LVL3 (GOWN DISPOSABLE) ×1 IMPLANT
GOWN STRL REUS W/TWL LRG LVL3 (GOWN DISPOSABLE) ×4 IMPLANT
GOWN STRL REUS W/TWL XL LVL3 (GOWN DISPOSABLE) ×2
GRASPER SUT TROCAR 14GX15 (MISCELLANEOUS) ×2 IMPLANT
IRRIG SUCT STRYKERFLOW 2 WTIP (MISCELLANEOUS) ×2
IRRIGATION SUCT STRKRFLW 2 WTP (MISCELLANEOUS) ×1 IMPLANT
KIT TURNOVER CYSTO (KITS) ×2 IMPLANT
MESH 3DMAX 5X7 LT XLRG (Mesh General) ×2 IMPLANT
NEEDLE INSUFFLATION 120MM (ENDOMECHANICALS) ×2 IMPLANT
PACK BASIN DAY SURGERY FS (CUSTOM PROCEDURE TRAY) ×2 IMPLANT
PAD POSITIONING PINK XL (MISCELLANEOUS) ×2 IMPLANT
RELOAD STAPLE HERNIA 4.0 BLUE (INSTRUMENTS) ×2 IMPLANT
SCISSORS LAP 5X35 DISP (ENDOMECHANICALS) ×2 IMPLANT
SET TUBE SMOKE EVAC HIGH FLOW (TUBING) ×2 IMPLANT
STAPLER HERNIA 12 8.5 360D (INSTRUMENTS) ×2 IMPLANT
SUT MNCRL AB 4-0 PS2 18 (SUTURE) ×2 IMPLANT
SUT VICRYL 0 UR6 27IN ABS (SUTURE) IMPLANT
TOWEL OR 17X26 10 PK STRL BLUE (TOWEL DISPOSABLE) ×2 IMPLANT
TRAY FOL W/BAG SLVR 16FR STRL (SET/KITS/TRAYS/PACK) IMPLANT
TRAY FOLEY W/BAG SLVR 16FR LF (SET/KITS/TRAYS/PACK)
TRAY LAPAROSCOPIC (CUSTOM PROCEDURE TRAY) ×2 IMPLANT
TROCAR BLADELESS OPT 12M 100M (ENDOMECHANICALS) ×2 IMPLANT
TROCAR BLADELESS OPT 5 100 (ENDOMECHANICALS) ×4 IMPLANT

## 2021-06-24 NOTE — Anesthesia Postprocedure Evaluation (Signed)
Anesthesia Post Note  Patient: David Vargas  Procedure(s) Performed: LAPAROSCOPIC LEFT INGUINAL HERNIA REPAIR WITH MESH (Left: Abdomen)     Patient location during evaluation: PACU Anesthesia Type: General Level of consciousness: sedated Pain management: pain level controlled Vital Signs Assessment: post-procedure vital signs reviewed and stable Respiratory status: spontaneous breathing and respiratory function stable Cardiovascular status: stable Postop Assessment: no apparent nausea or vomiting Anesthetic complications: no   No notable events documented.  Last Vitals:  Vitals:   06/24/21 0930 06/24/21 0945  BP: 109/73 (!) 148/81  Pulse: 63 61  Resp: 16 17  Temp:  36.4 C  SpO2: 98% 99%    Last Pain:  Vitals:   06/24/21 0945  TempSrc:   PainSc: 3                  Catharine Kettlewell DANIEL

## 2021-06-24 NOTE — Anesthesia Procedure Notes (Signed)
Procedure Name: Intubation Date/Time: 06/24/2021 7:43 AM Performed by: Georgeanne Nim, CRNA Pre-anesthesia Checklist: Patient identified, Emergency Drugs available, Suction available, Patient being monitored and Timeout performed Patient Re-evaluated:Patient Re-evaluated prior to induction Oxygen Delivery Method: Circle system utilized Induction Type: IV induction Ventilation: Mask ventilation without difficulty Laryngoscope Size: Mac, 4, Miller and 2 Grade View: Grade III Tube type: Oral Number of attempts: 2 Airway Equipment and Method: Stylet Placement Confirmation: ETT inserted through vocal cords under direct vision, positive ETCO2, CO2 detector and breath sounds checked- equal and bilateral Secured at: 23 cm Tube secured with: Tape Dental Injury: Teeth and Oropharynx as per pre-operative assessment  Comments: Only epiglottis visible with MAC blade, easy mask airway;Dr. Tobias Alexander visualized with Mil 2 with bottom of cords visible

## 2021-06-24 NOTE — Transfer of Care (Signed)
Immediate Anesthesia Transfer of Care Note  Patient: David Vargas  Procedure(s) Performed: LAPAROSCOPIC LEFT INGUINAL HERNIA REPAIR WITH MESH (Left: Abdomen)  Patient Location: PACU  Anesthesia Type:General  Level of Consciousness: awake, alert , oriented and patient cooperative  Airway & Oxygen Therapy: Patient Spontanous Breathing and Patient connected to nasal cannula oxygen  Post-op Assessment: Report given to RN and Post -op Vital signs reviewed and stable  Post vital signs: Reviewed and stable  Last Vitals:  Vitals Value Taken Time  BP    Temp    Pulse    Resp    SpO2      Last Pain:  Vitals:   06/24/21 0559  TempSrc: Oral  PainSc: 0-No pain      Patients Stated Pain Goal: 4 (62/83/66 2947)  Complications: No notable events documented.

## 2021-06-24 NOTE — Discharge Instructions (Addendum)
 GROIN HERNIA REPAIR POST OPERATIVE INSTRUCTIONS  Thinking Clearly  The anesthesia may cause you to feel different for 1 or 2 days. Do not drive, drink alcohol, or make any big decisions for at least 2 days.  Nutrition When you wake up, you will be able to drink small amounts of liquid. If you do not feel sick, you can slowly advance your diet to regular foods. Continue to drink lots of fluids, usually about 8 to 10 glasses per day. Eat a high-fiber diet so you don't strain during bowel movements. High-Fiber Foods Foods high in fiber include beans, bran cereals and whole-grain breads, peas, dried fruit (figs, apricots, and dates), raspberries, blackberries, strawberries, sweet corn, broccoli, baked potatoes with skin, plums, pears, apples, greens, and nuts. Activity Slowly increase your activity. Be sure to get up and walk every hour or so to prevent blood clots. No heavy lifting or strenuous activity for 4 weeks following surgery to prevent hernias at your incision sites or recurrence of your hernia. It is normal to feel tired. You may need more sleep than usual.  Get your rest but make sure to get up and move around frequently to prevent blood clots and pneumonia.  Work and Return to School You can go back to work when you feel well enough. Discuss the timing with your surgeon. You can usually go back to school or work 1 week or less after an laparoscopic or an open repair. If your work requires heavy lifting or strenuous activity you need to be placed on light duty for 4 weeks following surgery. You can return to gym class, sports or other physical activities 4 weeks after surgery.  Wound Care You may experience significant bruising in the groin including into the scrotum in males.  Rest, elevating the groin and scrotum above the level of the heart, ice and compression with tight fitting underwear can help.  Always wash your hands before and after touching near your incision site. Do  not soak in a bathtub until cleared at your follow up appointment. You may take a shower 24 hours after surgery. A small amount of drainage from the incision is normal. If the drainage is thick and yellow or the site is red, you may have an infection, so call your surgeon. If you have a drain in one of your incisions, it will be taken out in office when the drainage stops. Steri-Strips will fall off in 7 to 10 days or they will be removed during your first office visit. If you have dermabond glue covering over the incision, allow the glue to flake off on its own. Protect the new skin, especially from the sun. The sun can burn and cause darker scarring. Your scar will heal in about 4 to 6 weeks and will become softer and continue to fade over the next year.  The cosmetic appearance of the incisions will improve over the course of the first year after surgery. Sensation around your incision will return in a few weeks or months.  Bowel Movements After intestinal surgery, you may have loose watery stools for several days. If watery diarrhea lasts longer than 3 days, contact your surgeon. Pain medication (narcotics) can cause constipation. Increase the fiber in your diet with high-fiber foods if you are constipated. You can take an over the counter stool softener like Colace to avoid constipation.  Additional over the counter medications can also be used if Colace isn't sufficient (for example, Milk of Magnesia or Miralax).    Pain The amount of pain is different for each person. Some people need only 1 to 3 doses of pain control medication, while others need more. Take alternating doses of tylenol and ibuprofen around the clock for the first five days following surgery.  This will provide a baseline of pain control and help with inflammation.  Take the narcotic pain medication in addition if needed for severe pain.  Contact Your Surgeon at [phone], if you have: Pain that will not go away Pain that  gets worse A fever of more than 101F (38.3C) Repeated vomiting Swelling, redness, bleeding, or bad-smelling drainage from your wound site Strong abdominal pain No bowel movement or unable to pass gas for 3 days Watery diarrhea lasting longer than 3 days  Pain Control The goal of pain control is to minimize pain, keep you moving and help you heal. Your surgical team will work with you on your pain plan. Most often a combination of therapies and medications are used to control your pain. You may also be given medication (local anesthetic) at the surgical site. This may help control your pain for several days. Extreme pain puts extra stress on your body at a time when your body needs to focus on healing. Do not wait until your pain has reached a level "10" or is unbearable before telling your doctor or nurse. It is much easier to control pain before it becomes severe. Following a laparoscopic procedure, pain is sometimes felt in the shoulder. This is due to the gas inserted into your abdomen during the procedure. Moving and walking helps to decrease the gas and the right shoulder pain.  Use the guide below for ways to manage your post-operative pain. Learn more by going to facs.org/safepaincontrol.  How Intense Is My Pain Common Therapies to Feel Better       I hardly notice my pain, and it does not interfere with my activities.  I notice my pain and it distracts me, but I can still do activities (sitting up, walking, standing).  Non-Medication Therapies  Ice (in a bag, applied over clothing at the surgical site), elevation, rest, meditation, massage, distraction (music, TV, play) walking and mild exercise Splinting the abdomen with pillows +  Non-Opioid Medications Acetaminophen (Tylenol) Non-steroidal anti-inflammatory drugs (NSAIDS) Aspirin, Ibuprofen (Motrin, Advil) Naproxen (Aleve) Take these as needed, when you feel pain. Both acetaminophen and NSAIDs help to decrease pain  and swelling (inflammation).      My pain is hard to ignore and is more noticeable even when I rest.  My pain interferes with my usual activities.  Non-Medication Therapies  +  Non-Opioid medications  Take on a regular schedule (around-the-clock) instead of as needed. (For example, Tylenol every 6 hours at 9:00 am, 3:00 pm, 9:00 pm, 3:00 am and Motrin every 6 hours at 12:00 am, 6:00 am, 12:00 pm, 6:00 pm)         I am focused on my pain, and I am not doing my daily activities.  I am groaning in pain, and I cannot sleep. I am unable to do anything.  My pain is as bad as it could be, and nothing else matters.  Non-Medication Therapies  +  Around-the-Clock Non-Opioid Medications  +  Short-acting opioids  Opioids should be used with other medications to manage severe pain. Opioids block pain and give a feeling of euphoria (feel high). Addiction, a serious side effect of opioids, is rare with short-term (a few days) use.  Examples of short-acting opioids   include: Tramadol (Ultram), Hydrocodone (Norco, Vicodin), Hydromorphone (Dilaudid), Oxycodone (Oxycontin)     The above directions have been adapted from the [employer] Surgical Patient Education Program.  Please refer to the ACS website if needed: PreferredVet.ca.ashx   Louanna Raw, MD [facility], [address], [place], [place], [place]  [id number] ?  [address], [place],    [address] [phone] ? [phone] ? FAX [phone] Web site: [email or web address]     Post Anesthesia Home Care Instructions  Activity: Get plenty of rest for the remainder of the day. A responsible individual must stay with you for 24 hours following the procedure.  For the next 24 hours, DO NOT: -Drive a car -Paediatric nurse -Drink alcoholic beverages -Take any medication unless instructed by your  physician -Make any legal decisions or sign important papers.  Meals: Start with liquid foods such as gelatin or soup. Progress to regular foods as tolerated. Avoid greasy, spicy, heavy foods. If nausea and/or vomiting occur, drink only clear liquids until the nausea and/or vomiting subsides. Call your physician if vomiting continues.  Special Instructions/Symptoms: Your throat may feel dry or sore from the anesthesia or the breathing tube placed in your throat during surgery. If this causes discomfort, gargle with warm salt water. The discomfort should disappear within 24 hours.

## 2021-06-24 NOTE — Op Note (Signed)
Patient: David Vargas (11/18/1951, [id number])  Date of Surgery: 06/24/2021   Preoperative Diagnosis: LEFT INGUINAL HERNIA   Postoperative Diagnosis: LEFT INGUINAL HERNIA   Surgical Procedure: LAPAROSCOPIC LEFT INGUINAL HERNIA REPAIR WITH MESH: [id number]   Operative Team Members:  Surgeon(s) and Role:    * Minahil Quinlivan, Nickola Major, MD - Primary   Anesthesiologist: Duane Boston, MD CRNA: Georgeanne Nim, CRNA   Anesthesia: General   Fluids:  Total I/O In: 1000 [I.V.:1000] Out: 30 [GUYQI:34]  Complications: None  Drains:  None  Specimen: None  Disposition:  PACU - hemodynamically stable.  Plan of Care: Discharge to home after PACU  Indications for Procedure: ROEY COOPMAN is a 70 y.o. male who presented with a symptomatic left inguinal hernia containing the colon.  The procedure of laparoscopic left inguinal hernia repair with mesh was recommended for the patient.  The procedure itself as well as its risks, benefits and alternatives were discussed and the patient granted consent to proceed.  Findings:  Technique: Transabdominal preperitoneal (TAPP) Hernia Location: left indirect inguinal hernia containing the sigmoid colon Mesh Size &Type:  BARD 3D Max Left Extra-Large mesh Mesh Fixation: Endo-Universal hernia stapler  Infection status: Patient: Private Patient Elective Case Case: Elective Infection Present At Time Of Surgery (PATOS): None   Description of Procedure:  The patient was positioned supine, padded and secured to the bed, with both arms tucked.  The abdomen was widely prepped and draped.  A time out procedure was performed.  A 1 cm infraumbilical incision was made.  The abdomen was entered without trauma to the underlying viscera.  The abdomen was insufflated to 15 mm of Hg.  A 12 mm trocar was inserted at the periumbilical incision.  Additional 5 mm trocars were placed in the left and right abdomen.  There was no trauma to the underlying  viscera.  There was scar tissue in the right lower quadrant related to the patient's previous right inguinal hernia repair.  The cecum was scarred to the pelvic side wall.  There was no evidence of recurrent hernia on the right side  There was an  INDIRECT hernia on the LEFT.  Utilizing a transabdominal pre peritoneal technique (TAPP), a horizontal incision was made in the peritoneum, immediately below the umbilicus.  Dissection was carried out in the pre peritoneal space down to the level of the hernia sac which was reduced into the peritoneal cavity completely.  The cord contents were parietalized and preserved.  A large pre peritoneal dissection was performed to uncover the direct, indirect, femoral and obturator spaces.  Cooper's ligament was uncovered medially and the psoas muscle uncovered laterally.  The mesh, as documented above, was opened and advanced into the pre peritoneal position so that it more than adequately covered the indirect, direct, femoral and obturator spaces.  The mesh laid flat, with no inferior folds and covered the entire myopectineal orifice.  The mesh was fixated with the endo-universal hernia stapler to Cooper's ligament and the posterior aspect of the rectus muscle.  The peritoneal flap was closed with the same device.  There were no peritoneal defects or exposed mesh at the conclusion.  The umbilical trocar was removed and the fascial defect was closed with a 0 Vicryl suture.  The peritoneal cavity was completely desufflated, the trocars removed and the skin closed with 4-0 Monocryl subcuticular suture and skin glue.  All sponge and needle counts were correct at the end of the case.  Louanna Raw, MD General, Bariatric, &  Minimally Invasive Surgery [facility], [place]

## 2021-06-24 NOTE — H&P (Signed)
Admitting Physician: Nickola Major Izaak Sahr  Service: General surgery  CC: left inguinal herni  Subjective   HPI: David Vargas is an 70 y.o. male who is here for elective laparoscopic left inguinal hernia repair  Past Medical History:  Diagnosis Date   Arthritis    Basal cell carcinoma    followed by dermatology   Cancer [facility])    melanoma - left shoulder 8 to 9 yrs ago   Complication of anesthesia    slow to wake up   GERD (gastroesophageal reflux disease)    H/O colonoscopy    2009, polypoid colorectal mucosa found, Dr. Oletta Lamas advised repeat in 3 years   Hiatal hernia    upper GI in 2003   History of blood transfusion    7 units given summer 2021 for wallory-weiss tear   History of hypertension 06/22/2018   no htn in last 2 years per pt on 06-22-2021 after 60 pound weight loss   History of kidney stones    History of nuclear stress test 2005   abnormal, cardiac cath done    Left inguinal hernia 06/22/2021   PAF (paroxysmal atrial fibrillation) ([place])    he has had 2 episodes in the past one associated with kidney stone and the other urosepsis.  CHA2DS2/VASC score 1 (age > 22 >>he has remote hx of HTN but this resolved after weight loss) therefore will not anticoagulate    Past Surgical History:  Procedure Laterality Date   BIOPSY PROSTATE  08/03/2020   CARDIAC CATHETERIZATION  2005   no evidence of significamt atherosclerosis   ESOPHAGOGASTRODUODENOSCOPY (EGD) WITH PROPOFOL N/A 08/11/2020   Procedure: ESOPHAGOGASTRODUODENOSCOPY (EGD) WITH PROPOFOL;  Surgeon: Ronnette Juniper, MD;  Location: [facility];  Service: Gastroenterology;  Laterality: N/A;   ESOPHAGOGASTRODUODENOSCOPY (EGD) WITH PROPOFOL N/A 08/15/2020   Procedure: ESOPHAGOGASTRODUODENOSCOPY (EGD) WITH PROPOFOL;  Surgeon: Ronald Lobo, MD;  Location: [facility];  Service: Endoscopy;  Laterality: N/A;   EXTRACORPOREAL SHOCK WAVE LITHOTRIPSY Right 02/17/2020   Procedure: EXTRACORPOREAL SHOCK WAVE LITHOTRIPSY  (ESWL);  Surgeon: Irine Seal, MD;  Location[facility];  Service: Urology;  Laterality: Right;   HEMOSTASIS CLIP PLACEMENT  08/11/2020   Procedure: HEMOSTASIS CLIP PLACEMENT;  Surgeon: Ronnette Juniper, MD;  [facility] ENDOSCOPY;  Service: Gastroenterology;;   HEMOSTASIS CONTROL  08/11/2020   Procedure: HEMOSTASIS CONTROL;  Surgeon: Ronnette Juniper, MD;  Location: [facility] ENDOSCOPY;  Service: Gastroenterology;;   [place]   right groin   KNEE SURGERY     left 1984, right 2010  Arthroscopic   SHOULDER SURGERY Right    rotator cuff repair   TOTAL KNEE ARTHROPLASTY Left 11/28/2017   Procedure: TOTAL KNEE ARTHROPLASTY;  Surgeon: Renette Butters, MD;  Location: [facility];  Service: Orthopedics;  Laterality: Left;    Family History  Problem Relation Age of Onset   Heart attack Mother    Liver cancer Father    Multiple sclerosis Sister     Social:  reports that he has never smoked. He has never used smokeless tobacco. He reports current alcohol use. He reports that he does not use drugs.  Allergies:  Allergies  Allergen Reactions   Atorvastatin     Muscle aches   Simvastatin     Muscle aches    Medications: Current Outpatient Medications  Medication Instructions   acetaminophen (TYLENOL) 650 mg, Oral, Every 6 hours PRN   Coenzyme Q10 (COQ10 PO) Oral, Daily   famotidine (PEPCID) 20 mg, Oral, As needed  Ferrous Sulfate (IRON) 325 (65 Fe) MG TABS Oral, Iron 65 mg daily   melatonin 5 mg, Oral, At bedtime PRN   pantoprazole (PROTONIX) 40 mg, Oral, Daily   rosuvastatin (CRESTOR) 10 mg, Oral, Daily at bedtime   tadalafil (CIALIS) 5 mg, Oral, Daily PRN   tamsulosin (FLOMAX) 0.4 mg, Oral, As needed, For kidney stones     ROS - all of the below systems have been reviewed with the patient and positives are indicated with bold text General: chills, fever or night sweats Eyes: blurry vision or double vision ENT: epistaxis or sore throat Allergy/Immunology: itchy/watery  eyes or nasal congestion Hematologic/Lymphatic: bleeding problems, blood clots or swollen lymph nodes Endocrine: temperature intolerance or unexpected weight changes Breast: new or changing breast lumps or nipple discharge Resp: cough, shortness of breath, or wheezing CV: chest pain or dyspnea on exertion GI: as per HPI GU: dysuria, trouble voiding, or hematuria MSK: joint pain or joint stiffness Neuro: TIA or stroke symptoms Derm: pruritus and skin lesion changes Psych: anxiety and depression  Objective   PE Blood pressure (!) 145/94, pulse 67, temperature 98.4 F (36.9 C), temperature source Oral, resp. rate 17, height 6' 4.5" (1.943 m), weight 101.5 kg, SpO2 97 %. Constitutional: NAD; conversant; no deformities Eyes: Moist conjunctiva; no lid lag; anicteric; PERRL Neck: Trachea midline; no thyromegaly Lungs: Normal respiratory effort; no tactile fremitus CV: RRR; no palpable thrills; no pitting edema GI: Abd left inguinal herni; no palpable hepatosplenomegaly MSK: Normal range of motion of extremities; no clubbing/cyanosis Psychiatric: Appropriate affect; alert and oriented x3 Lymphatic: No palpable cervical or axillary lymphadenopathy  Results for orders placed or performed during the hospital encounter of 06/24/21 (from the past 24 hour(s))  I-STAT, chem 8     Status: Abnormal   Collection Time: 06/24/21  6:19 AM  Result Value Ref Range   Sodium 142 135 - 145 mmol/L   Potassium 4.5 3.5 - 5.1 mmol/L   Chloride 104 98 - 111 mmol/L   BUN 28 (H) 8 - 23 mg/dL   Creatinine, Ser 0.90 0.61 - 1.24 mg/dL   Glucose, Bld 92 70 - 99 mg/dL   Calcium, Ion 1.23 1.15 - 1.40 mmol/L   TCO2 27 22 - 32 mmol/L   Hemoglobin 16.0 13.0 - 17.0 g/dL   HCT 47.0 39.0 - 52.0 %    Imaging Orders  No imaging studies ordered today     Assessment and Plan   David Vargas is an 70 y.o. male who is here for elective laparoscopic left inguinal hernia repair.  The procedure itself as well as its  risks, benefits and alternatives were discussed and the patient granted consent to proceed.  We will proceed as scheduled.   Felicie Morn, MD  [facility] Use [email or web address] to contact on call provider

## 2021-06-25 ENCOUNTER — Encounter (HOSPITAL_BASED_OUTPATIENT_CLINIC_OR_DEPARTMENT_OTHER): Payer: Self-pay | Admitting: Surgery

## 2021-07-06 DIAGNOSIS — K219 Gastro-esophageal reflux disease without esophagitis: Secondary | ICD-10-CM | POA: Diagnosis not present

## 2021-07-06 DIAGNOSIS — D509 Iron deficiency anemia, unspecified: Secondary | ICD-10-CM | POA: Diagnosis not present

## 2021-07-06 DIAGNOSIS — I480 Paroxysmal atrial fibrillation: Secondary | ICD-10-CM | POA: Diagnosis not present

## 2021-07-06 DIAGNOSIS — D500 Iron deficiency anemia secondary to blood loss (chronic): Secondary | ICD-10-CM | POA: Diagnosis not present

## 2021-07-06 DIAGNOSIS — E785 Hyperlipidemia, unspecified: Secondary | ICD-10-CM | POA: Diagnosis not present

## 2021-07-06 DIAGNOSIS — J432 Centrilobular emphysema: Secondary | ICD-10-CM | POA: Diagnosis not present

## 2021-07-06 DIAGNOSIS — I10 Essential (primary) hypertension: Secondary | ICD-10-CM | POA: Diagnosis not present

## 2021-07-07 DIAGNOSIS — E041 Nontoxic single thyroid nodule: Secondary | ICD-10-CM | POA: Diagnosis not present

## 2021-07-08 DIAGNOSIS — Z0000 Encounter for general adult medical examination without abnormal findings: Secondary | ICD-10-CM | POA: Diagnosis not present

## 2021-07-08 DIAGNOSIS — Z1389 Encounter for screening for other disorder: Secondary | ICD-10-CM | POA: Diagnosis not present

## 2021-07-12 ENCOUNTER — Other Ambulatory Visit: Payer: Self-pay | Admitting: Endocrinology

## 2021-07-12 DIAGNOSIS — E041 Nontoxic single thyroid nodule: Secondary | ICD-10-CM

## 2021-07-13 DIAGNOSIS — Z13220 Encounter for screening for lipoid disorders: Secondary | ICD-10-CM | POA: Diagnosis not present

## 2021-07-13 DIAGNOSIS — K219 Gastro-esophageal reflux disease without esophagitis: Secondary | ICD-10-CM | POA: Diagnosis not present

## 2021-07-13 DIAGNOSIS — D500 Iron deficiency anemia secondary to blood loss (chronic): Secondary | ICD-10-CM | POA: Diagnosis not present

## 2021-07-13 DIAGNOSIS — D509 Iron deficiency anemia, unspecified: Secondary | ICD-10-CM | POA: Diagnosis not present

## 2021-07-13 DIAGNOSIS — Z8679 Personal history of other diseases of the circulatory system: Secondary | ICD-10-CM | POA: Diagnosis not present

## 2021-07-13 DIAGNOSIS — J309 Allergic rhinitis, unspecified: Secondary | ICD-10-CM | POA: Diagnosis not present

## 2021-07-13 DIAGNOSIS — E785 Hyperlipidemia, unspecified: Secondary | ICD-10-CM | POA: Diagnosis not present

## 2021-07-13 DIAGNOSIS — Z136 Encounter for screening for cardiovascular disorders: Secondary | ICD-10-CM | POA: Diagnosis not present

## 2021-07-13 DIAGNOSIS — N529 Male erectile dysfunction, unspecified: Secondary | ICD-10-CM | POA: Diagnosis not present

## 2021-07-13 DIAGNOSIS — I10 Essential (primary) hypertension: Secondary | ICD-10-CM | POA: Diagnosis not present

## 2021-07-13 DIAGNOSIS — K269 Duodenal ulcer, unspecified as acute or chronic, without hemorrhage or perforation: Secondary | ICD-10-CM | POA: Diagnosis not present

## 2021-07-22 DIAGNOSIS — E559 Vitamin D deficiency, unspecified: Secondary | ICD-10-CM | POA: Diagnosis not present

## 2021-07-22 DIAGNOSIS — Z8679 Personal history of other diseases of the circulatory system: Secondary | ICD-10-CM | POA: Diagnosis not present

## 2021-07-22 DIAGNOSIS — E785 Hyperlipidemia, unspecified: Secondary | ICD-10-CM | POA: Diagnosis not present

## 2021-07-22 DIAGNOSIS — D509 Iron deficiency anemia, unspecified: Secondary | ICD-10-CM | POA: Diagnosis not present

## 2021-07-22 DIAGNOSIS — K219 Gastro-esophageal reflux disease without esophagitis: Secondary | ICD-10-CM | POA: Diagnosis not present

## 2021-07-22 DIAGNOSIS — K4090 Unilateral inguinal hernia, without obstruction or gangrene, not specified as recurrent: Secondary | ICD-10-CM | POA: Diagnosis not present

## 2021-07-22 DIAGNOSIS — J309 Allergic rhinitis, unspecified: Secondary | ICD-10-CM | POA: Diagnosis not present

## 2021-07-22 DIAGNOSIS — N529 Male erectile dysfunction, unspecified: Secondary | ICD-10-CM | POA: Diagnosis not present

## 2021-07-22 DIAGNOSIS — K269 Duodenal ulcer, unspecified as acute or chronic, without hemorrhage or perforation: Secondary | ICD-10-CM | POA: Diagnosis not present

## 2021-07-27 ENCOUNTER — Other Ambulatory Visit: Payer: Self-pay | Admitting: *Deleted

## 2021-07-27 ENCOUNTER — Ambulatory Visit
Admission: RE | Admit: 2021-07-27 | Discharge: 2021-07-27 | Disposition: A | Discharge status: Home / Self Care | Payer: Medicare HMO | Source: Ambulatory Visit | Attending: Endocrinology | Admitting: Endocrinology

## 2021-07-27 ENCOUNTER — Encounter: Payer: Self-pay | Admitting: *Deleted

## 2021-07-27 DIAGNOSIS — Z01812 Encounter for preprocedural laboratory examination: Secondary | ICD-10-CM

## 2021-07-27 DIAGNOSIS — I712 Thoracic aortic aneurysm, without rupture, unspecified: Secondary | ICD-10-CM

## 2021-07-27 DIAGNOSIS — I10 Essential (primary) hypertension: Secondary | ICD-10-CM

## 2021-07-27 DIAGNOSIS — E041 Nontoxic single thyroid nodule: Secondary | ICD-10-CM

## 2021-07-27 NOTE — Progress Notes (Signed)
This encounter was created in error - please disregard.

## 2021-08-10 ENCOUNTER — Other Ambulatory Visit: Payer: Medicare HMO | Admitting: *Deleted

## 2021-08-10 ENCOUNTER — Other Ambulatory Visit: Payer: Self-pay

## 2021-08-10 DIAGNOSIS — Z01812 Encounter for preprocedural laboratory examination: Secondary | ICD-10-CM | POA: Diagnosis not present

## 2021-08-10 LAB — BASIC METABOLIC PANEL
BUN/Creatinine Ratio: 26 — ABNORMAL HIGH (ref 10–24)
BUN: 25 mg/dL (ref 8–27)
CO2: 24 mmol/L (ref 20–29)
Calcium: 9.2 mg/dL (ref 8.6–10.2)
Chloride: 104 mmol/L (ref 96–106)
Creatinine, Ser: 0.97 mg/dL (ref 0.76–1.27)
Glucose: 95 mg/dL (ref 65–99)
Potassium: 4.3 mmol/L (ref 3.5–5.2)
Sodium: 142 mmol/L (ref 134–144)
eGFR: 83 mL/min/{1.73_m2} (ref >59)

## 2021-08-19 ENCOUNTER — Ambulatory Visit (INDEPENDENT_AMBULATORY_CARE_PROVIDER_SITE_OTHER)
Admission: RE | Admit: 2021-08-19 | Discharge: 2021-08-19 | Disposition: A | Discharge status: Home / Self Care | Payer: Medicare HMO | Source: Ambulatory Visit | Attending: Cardiology | Admitting: Cardiology

## 2021-08-19 ENCOUNTER — Other Ambulatory Visit: Payer: Self-pay

## 2021-08-19 DIAGNOSIS — I712 Thoracic aortic aneurysm, without rupture, unspecified: Secondary | ICD-10-CM

## 2021-08-19 DIAGNOSIS — N281 Cyst of kidney, acquired: Secondary | ICD-10-CM | POA: Diagnosis not present

## 2021-08-19 DIAGNOSIS — E041 Nontoxic single thyroid nodule: Secondary | ICD-10-CM | POA: Diagnosis not present

## 2021-08-19 DIAGNOSIS — N200 Calculus of kidney: Secondary | ICD-10-CM | POA: Diagnosis not present

## 2021-08-19 MED ORDER — IOHEXOL 350 MG/ML SOLN
100.0000 mL | Freq: Once | INTRAVENOUS | Status: AC | PRN
  Administered 2021-08-19: 100 mL via INTRAVENOUS

## 2021-09-23 DIAGNOSIS — E559 Vitamin D deficiency, unspecified: Secondary | ICD-10-CM | POA: Diagnosis not present

## 2021-10-03 ENCOUNTER — Other Ambulatory Visit: Payer: Self-pay | Admitting: Cardiology

## 2021-10-19 DIAGNOSIS — D224 Melanocytic nevi of scalp and neck: Secondary | ICD-10-CM | POA: Diagnosis not present

## 2021-10-19 DIAGNOSIS — D225 Melanocytic nevi of trunk: Secondary | ICD-10-CM | POA: Diagnosis not present

## 2021-10-19 DIAGNOSIS — D2262 Melanocytic nevi of left upper limb, including shoulder: Secondary | ICD-10-CM | POA: Diagnosis not present

## 2021-10-19 DIAGNOSIS — Z85828 Personal history of other malignant neoplasm of skin: Secondary | ICD-10-CM | POA: Diagnosis not present

## 2021-10-19 DIAGNOSIS — D2261 Melanocytic nevi of right upper limb, including shoulder: Secondary | ICD-10-CM | POA: Diagnosis not present

## 2021-10-19 DIAGNOSIS — D485 Neoplasm of uncertain behavior of skin: Secondary | ICD-10-CM | POA: Diagnosis not present

## 2021-10-19 DIAGNOSIS — C4441 Basal cell carcinoma of skin of scalp and neck: Secondary | ICD-10-CM | POA: Diagnosis not present

## 2021-10-19 DIAGNOSIS — Z85820 Personal history of malignant melanoma of skin: Secondary | ICD-10-CM | POA: Diagnosis not present

## 2021-10-19 DIAGNOSIS — L821 Other seborrheic keratosis: Secondary | ICD-10-CM | POA: Diagnosis not present

## 2021-10-26 DIAGNOSIS — E785 Hyperlipidemia, unspecified: Secondary | ICD-10-CM | POA: Diagnosis not present

## 2021-10-26 DIAGNOSIS — E559 Vitamin D deficiency, unspecified: Secondary | ICD-10-CM | POA: Diagnosis not present

## 2021-10-26 DIAGNOSIS — D509 Iron deficiency anemia, unspecified: Secondary | ICD-10-CM | POA: Diagnosis not present

## 2021-11-05 DIAGNOSIS — N529 Male erectile dysfunction, unspecified: Secondary | ICD-10-CM | POA: Diagnosis not present

## 2021-11-05 DIAGNOSIS — E559 Vitamin D deficiency, unspecified: Secondary | ICD-10-CM | POA: Diagnosis not present

## 2021-11-05 DIAGNOSIS — K219 Gastro-esophageal reflux disease without esophagitis: Secondary | ICD-10-CM | POA: Diagnosis not present

## 2021-11-05 DIAGNOSIS — E785 Hyperlipidemia, unspecified: Secondary | ICD-10-CM | POA: Diagnosis not present

## 2021-11-05 DIAGNOSIS — Z8679 Personal history of other diseases of the circulatory system: Secondary | ICD-10-CM | POA: Diagnosis not present

## 2021-11-05 DIAGNOSIS — D509 Iron deficiency anemia, unspecified: Secondary | ICD-10-CM | POA: Diagnosis not present

## 2021-11-11 DIAGNOSIS — Z8719 Personal history of other diseases of the digestive system: Secondary | ICD-10-CM | POA: Diagnosis not present

## 2021-11-11 DIAGNOSIS — Z86010 Personal history of colonic polyps: Secondary | ICD-10-CM | POA: Diagnosis not present

## 2021-12-06 ENCOUNTER — Other Ambulatory Visit: Payer: Self-pay | Admitting: Cardiology

## 2021-12-06 DIAGNOSIS — I2510 Atherosclerotic heart disease of native coronary artery without angina pectoris: Secondary | ICD-10-CM

## 2021-12-06 DIAGNOSIS — M25551 Pain in right hip: Secondary | ICD-10-CM | POA: Diagnosis not present

## 2021-12-06 DIAGNOSIS — E782 Mixed hyperlipidemia: Secondary | ICD-10-CM

## 2021-12-06 DIAGNOSIS — M5136 Other intervertebral disc degeneration, lumbar region: Secondary | ICD-10-CM | POA: Diagnosis not present

## 2021-12-06 DIAGNOSIS — Z79899 Other long term (current) drug therapy: Secondary | ICD-10-CM

## 2021-12-06 DIAGNOSIS — E785 Hyperlipidemia, unspecified: Secondary | ICD-10-CM

## 2021-12-10 IMAGING — US US THYROID
1 series · 13 of 25 positions shown · non-contrast
Comparison: 05/10/2018, 07/18/2019

CLINICAL DATA: Left thyroid nodules, previous biopsy 05/10/2018

EXAM:
THYROID ULTRASOUND
TECHNIQUE: Ultrasound examination of the thyroid gland and adjacent soft
tissues was performed.

[Series 1: us thyroid · 0.06mm/px · 13 of 46 slices shown]
[im 1/46]
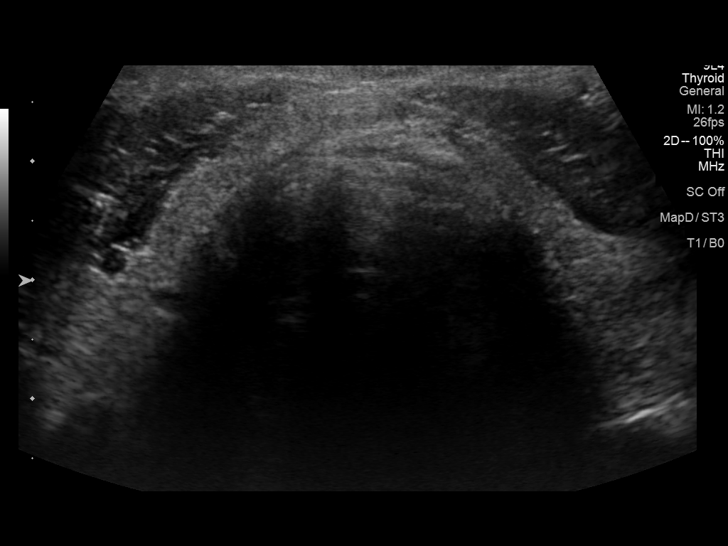
[im 4/46]
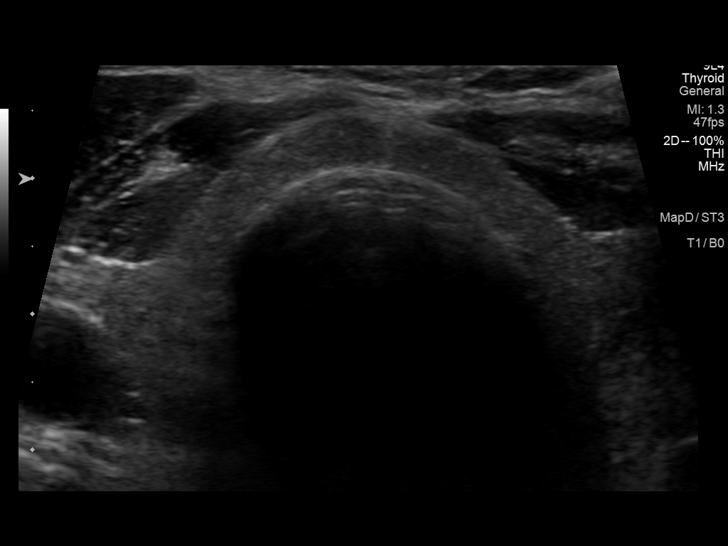
[im 8/46]
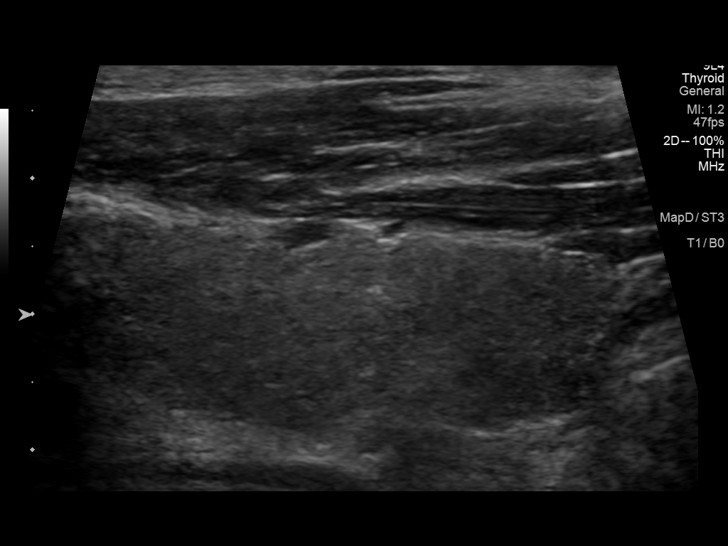
[im 12/46]
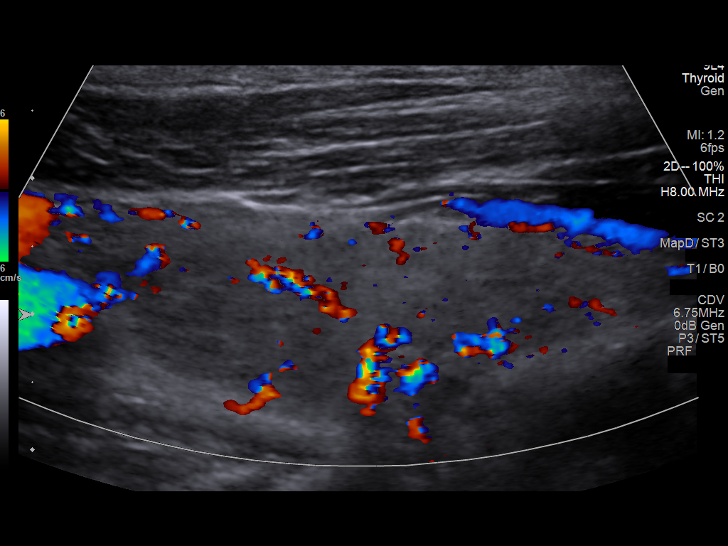
[im 16/46]
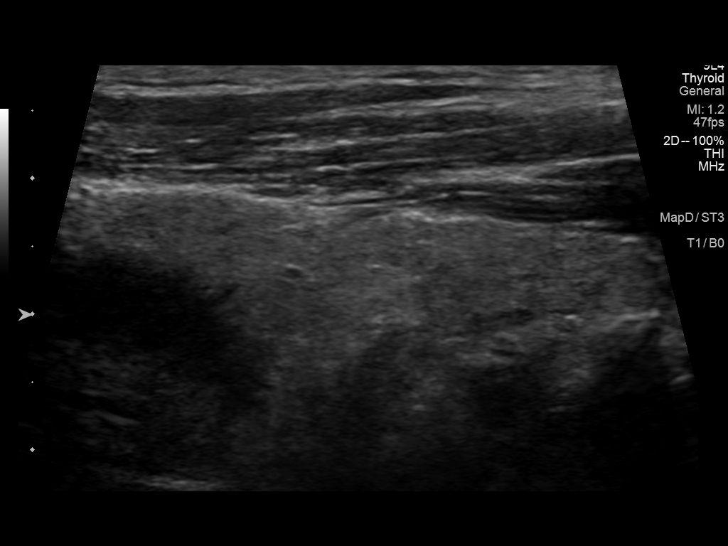
[im 19/46]
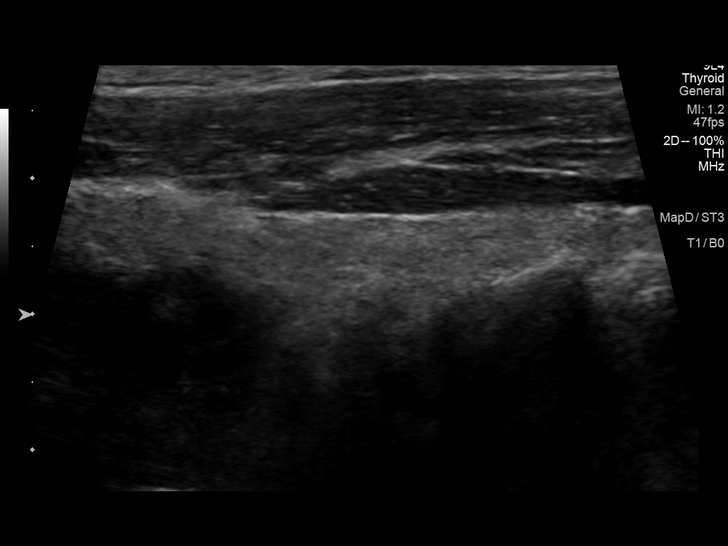
[im 23/46]
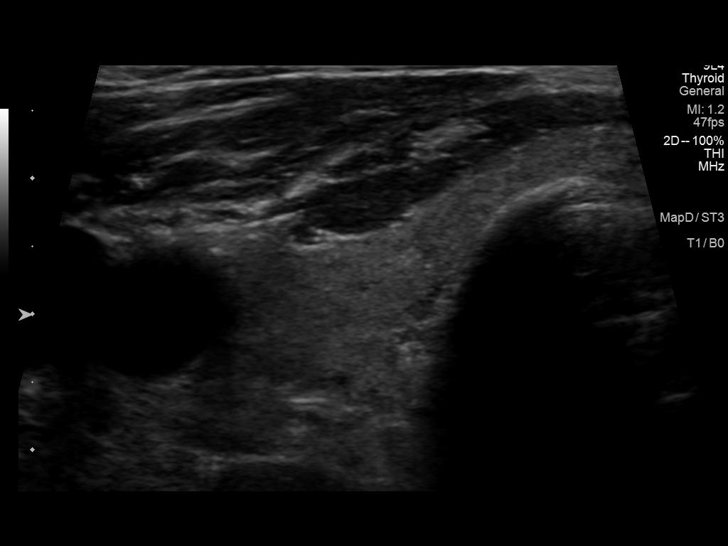
[im 27/46]
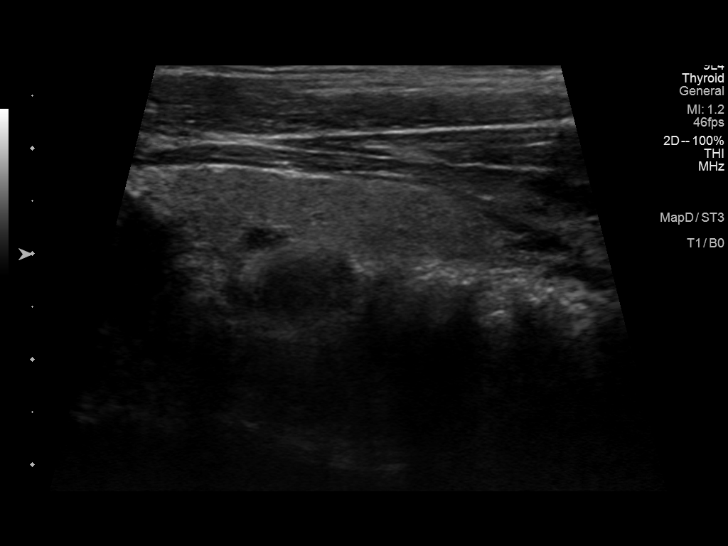
[im 31/46]
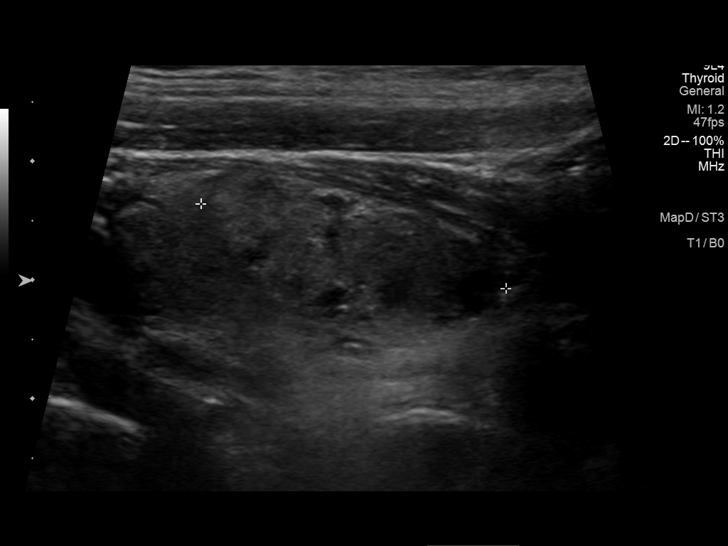
[im 34/46]
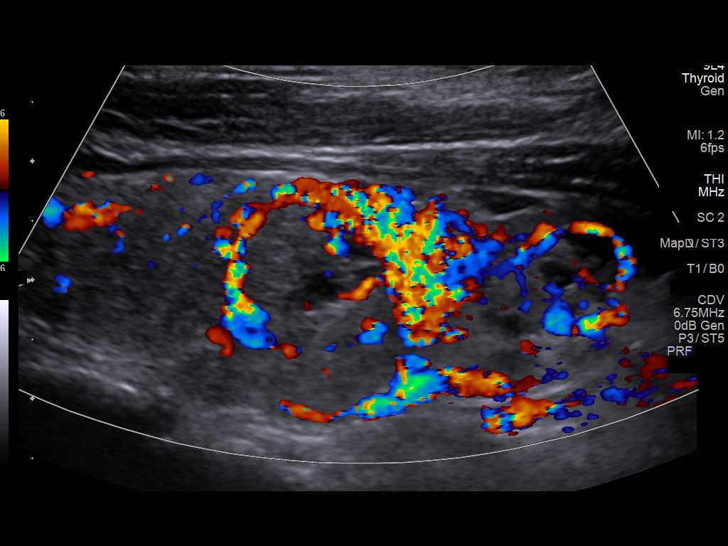
[im 38/46]
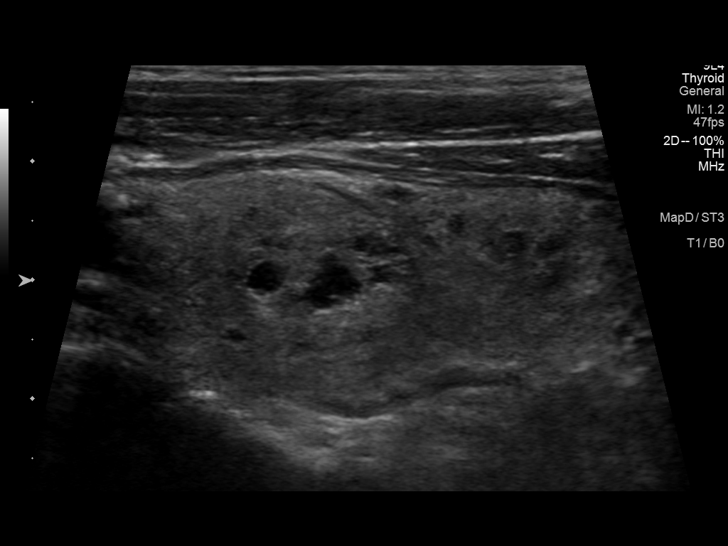
[im 42/46]
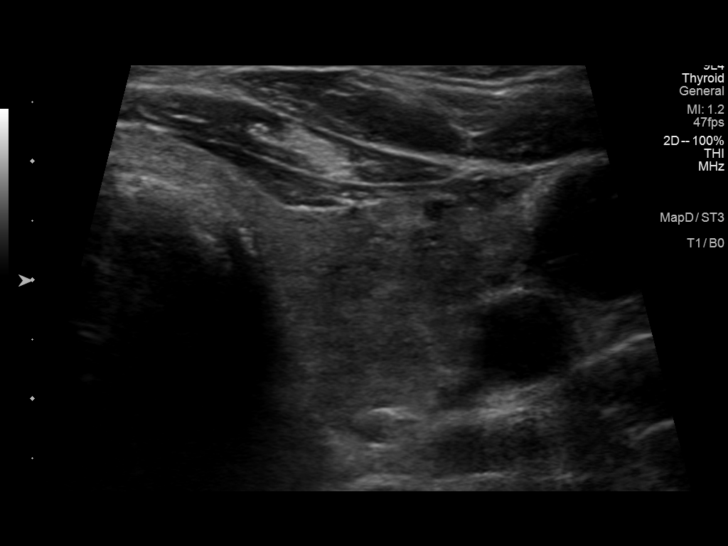
[im 46/46]
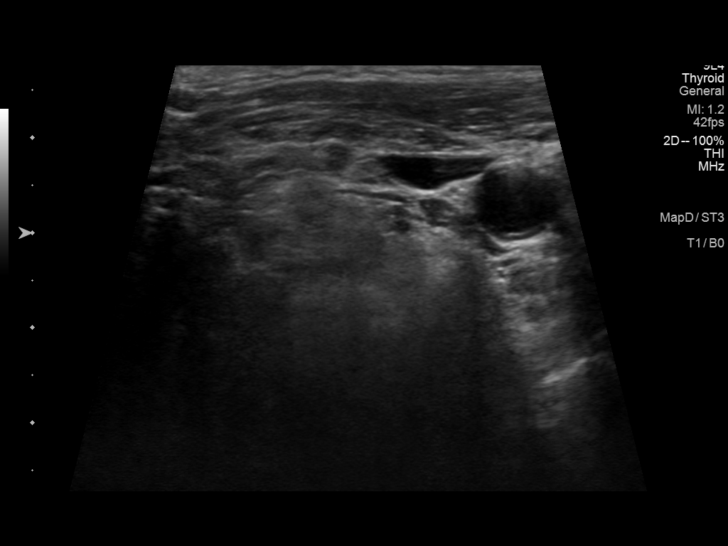

[13 of 25 positions shown; findings below may reference images not displayed]

FINDINGS: Parenchymal Echotexture: Mildly heterogenous

Isthmus: 3 mm

Right lobe: 4.5 x 1.6 x 1.5 cm

Left lobe: 6.0 x 2.1 x 2.2 cm

_________________________________________________________

Estimated total number of nodules >/= 1 cm: 2

Number of spongiform nodules >/=  2 cm not described below (TR1): 0

Number of mixed cystic and solid nodules >/= 1.5 cm not described
below (TR2): 0

_________________________________________________________

The previously biopsied left mid thyroid nodule remains stable
measuring 2.0 x 1.5 x 1.4 cm, previously 2.0 x 1.4 x 1.3 cm.
Correlate with prior pathology.

The inferior left thyroid has a stable area of heterogeneous
lobulation/pseudo nodularity. Also stable in appearance.

No new or enlarging thyroid abnormality. No hypervascularity or
regional adenopathy.
IMPRESSION: Stable previously biopsied left mid thyroid nodule measuring 2 cm.

Stable left inferior thyroid heterogeneity/pseudo nodule.

The above is in keeping with the ACR TI-RADS recommendations - [HOSPITAL] 6528;[DATE].

## 2021-12-22 DIAGNOSIS — J432 Centrilobular emphysema: Secondary | ICD-10-CM | POA: Diagnosis not present

## 2021-12-22 DIAGNOSIS — E785 Hyperlipidemia, unspecified: Secondary | ICD-10-CM | POA: Diagnosis not present

## 2021-12-22 DIAGNOSIS — I10 Essential (primary) hypertension: Secondary | ICD-10-CM | POA: Diagnosis not present

## 2021-12-22 DIAGNOSIS — D500 Iron deficiency anemia secondary to blood loss (chronic): Secondary | ICD-10-CM | POA: Diagnosis not present

## 2021-12-22 DIAGNOSIS — I480 Paroxysmal atrial fibrillation: Secondary | ICD-10-CM | POA: Diagnosis not present

## 2021-12-22 DIAGNOSIS — D509 Iron deficiency anemia, unspecified: Secondary | ICD-10-CM | POA: Diagnosis not present

## 2021-12-22 DIAGNOSIS — K219 Gastro-esophageal reflux disease without esophagitis: Secondary | ICD-10-CM | POA: Diagnosis not present

## 2022-02-15 DIAGNOSIS — Z0100 Encounter for examination of eyes and vision without abnormal findings: Secondary | ICD-10-CM | POA: Diagnosis not present

## 2022-02-15 DIAGNOSIS — H5203 Hypermetropia, bilateral: Secondary | ICD-10-CM | POA: Diagnosis not present

## 2022-02-24 DIAGNOSIS — Z86010 Personal history of colonic polyps: Secondary | ICD-10-CM | POA: Diagnosis not present

## 2022-02-24 DIAGNOSIS — K5730 Diverticulosis of large intestine without perforation or abscess without bleeding: Secondary | ICD-10-CM | POA: Diagnosis not present

## 2022-02-24 DIAGNOSIS — D122 Benign neoplasm of ascending colon: Secondary | ICD-10-CM | POA: Diagnosis not present

## 2022-02-24 DIAGNOSIS — D120 Benign neoplasm of cecum: Secondary | ICD-10-CM | POA: Diagnosis not present

## 2022-02-24 DIAGNOSIS — D128 Benign neoplasm of rectum: Secondary | ICD-10-CM | POA: Diagnosis not present

## 2022-02-24 DIAGNOSIS — K6289 Other specified diseases of anus and rectum: Secondary | ICD-10-CM | POA: Diagnosis not present

## 2022-02-24 DIAGNOSIS — D123 Benign neoplasm of transverse colon: Secondary | ICD-10-CM | POA: Diagnosis not present

## 2022-02-28 DIAGNOSIS — D128 Benign neoplasm of rectum: Secondary | ICD-10-CM | POA: Diagnosis not present

## 2022-02-28 DIAGNOSIS — D123 Benign neoplasm of transverse colon: Secondary | ICD-10-CM | POA: Diagnosis not present

## 2022-02-28 DIAGNOSIS — D120 Benign neoplasm of cecum: Secondary | ICD-10-CM | POA: Diagnosis not present

## 2022-03-24 ENCOUNTER — Other Ambulatory Visit: Payer: Self-pay | Admitting: Cardiology

## 2022-04-03 ENCOUNTER — Other Ambulatory Visit: Payer: Self-pay | Admitting: Cardiology

## 2022-04-26 DIAGNOSIS — Z85828 Personal history of other malignant neoplasm of skin: Secondary | ICD-10-CM | POA: Diagnosis not present

## 2022-04-26 DIAGNOSIS — L738 Other specified follicular disorders: Secondary | ICD-10-CM | POA: Diagnosis not present

## 2022-04-26 DIAGNOSIS — D1801 Hemangioma of skin and subcutaneous tissue: Secondary | ICD-10-CM | POA: Diagnosis not present

## 2022-04-26 DIAGNOSIS — D225 Melanocytic nevi of trunk: Secondary | ICD-10-CM | POA: Diagnosis not present

## 2022-04-26 DIAGNOSIS — Z85820 Personal history of malignant melanoma of skin: Secondary | ICD-10-CM | POA: Diagnosis not present

## 2022-04-26 DIAGNOSIS — L570 Actinic keratosis: Secondary | ICD-10-CM | POA: Diagnosis not present

## 2022-04-26 DIAGNOSIS — L821 Other seborrheic keratosis: Secondary | ICD-10-CM | POA: Diagnosis not present

## 2022-05-19 DIAGNOSIS — D509 Iron deficiency anemia, unspecified: Secondary | ICD-10-CM | POA: Diagnosis not present

## 2022-05-19 DIAGNOSIS — E785 Hyperlipidemia, unspecified: Secondary | ICD-10-CM | POA: Diagnosis not present

## 2022-05-19 DIAGNOSIS — E559 Vitamin D deficiency, unspecified: Secondary | ICD-10-CM | POA: Diagnosis not present

## 2022-05-26 DIAGNOSIS — D509 Iron deficiency anemia, unspecified: Secondary | ICD-10-CM | POA: Diagnosis not present

## 2022-05-26 DIAGNOSIS — I77810 Thoracic aortic ectasia: Secondary | ICD-10-CM | POA: Diagnosis not present

## 2022-05-26 DIAGNOSIS — I480 Paroxysmal atrial fibrillation: Secondary | ICD-10-CM | POA: Diagnosis not present

## 2022-05-26 DIAGNOSIS — E875 Hyperkalemia: Secondary | ICD-10-CM | POA: Diagnosis not present

## 2022-05-26 DIAGNOSIS — E785 Hyperlipidemia, unspecified: Secondary | ICD-10-CM | POA: Diagnosis not present

## 2022-05-26 DIAGNOSIS — I700 Atherosclerosis of aorta: Secondary | ICD-10-CM | POA: Diagnosis not present

## 2022-05-26 DIAGNOSIS — E559 Vitamin D deficiency, unspecified: Secondary | ICD-10-CM | POA: Diagnosis not present

## 2022-05-26 DIAGNOSIS — Z0000 Encounter for general adult medical examination without abnormal findings: Secondary | ICD-10-CM | POA: Diagnosis not present

## 2022-05-26 DIAGNOSIS — I10 Essential (primary) hypertension: Secondary | ICD-10-CM | POA: Diagnosis not present

## 2022-05-26 DIAGNOSIS — J432 Centrilobular emphysema: Secondary | ICD-10-CM | POA: Diagnosis not present

## 2022-05-26 DIAGNOSIS — K219 Gastro-esophageal reflux disease without esophagitis: Secondary | ICD-10-CM | POA: Diagnosis not present

## 2022-06-13 DIAGNOSIS — M545 Low back pain, unspecified: Secondary | ICD-10-CM | POA: Diagnosis not present

## 2022-07-08 DIAGNOSIS — N401 Enlarged prostate with lower urinary tract symptoms: Secondary | ICD-10-CM | POA: Diagnosis not present

## 2022-07-08 DIAGNOSIS — R310 Gross hematuria: Secondary | ICD-10-CM | POA: Diagnosis not present

## 2022-07-08 DIAGNOSIS — R351 Nocturia: Secondary | ICD-10-CM | POA: Diagnosis not present

## 2022-07-08 DIAGNOSIS — N200 Calculus of kidney: Secondary | ICD-10-CM | POA: Diagnosis not present

## 2022-07-14 DIAGNOSIS — L720 Epidermal cyst: Secondary | ICD-10-CM | POA: Diagnosis not present

## 2022-07-14 DIAGNOSIS — C4441 Basal cell carcinoma of skin of scalp and neck: Secondary | ICD-10-CM | POA: Diagnosis not present

## 2022-07-14 DIAGNOSIS — D485 Neoplasm of uncertain behavior of skin: Secondary | ICD-10-CM | POA: Diagnosis not present

## 2022-07-14 DIAGNOSIS — Z85828 Personal history of other malignant neoplasm of skin: Secondary | ICD-10-CM | POA: Diagnosis not present

## 2022-07-27 ENCOUNTER — Other Ambulatory Visit: Payer: Self-pay | Admitting: *Deleted

## 2022-07-27 NOTE — Patient Outreach (Signed)
  Care Coordination   07/27/2022 Name: David Vargas MRN: [id number] DOB: 1951-12-06   Care Coordination Outreach Attempts:  An unsuccessful telephone outreach was attempted today to offer the patient information about available care coordination services as a benefit of their health plan.   Follow Up Plan:  Additional outreach attempts will be made to offer the patient care coordination information and services.   Encounter Outcome:  No Answer  Care Coordination Interventions Activated:  Yes   Care Coordination Interventions:  No, not indicated    [facility] [phone]

## 2022-09-22 ENCOUNTER — Encounter (HOSPITAL_BASED_OUTPATIENT_CLINIC_OR_DEPARTMENT_OTHER): Payer: Self-pay | Admitting: Cardiology

## 2022-09-23 MED ORDER — ROSUVASTATIN CALCIUM 10 MG PO TABS: 10.0000 mg | ORAL_TABLET | Freq: Every day | ORAL | 0 refills | Status: DC

## 2022-11-01 DIAGNOSIS — L570 Actinic keratosis: Secondary | ICD-10-CM | POA: Diagnosis not present

## 2022-11-01 DIAGNOSIS — L821 Other seborrheic keratosis: Secondary | ICD-10-CM | POA: Diagnosis not present

## 2022-11-01 DIAGNOSIS — Z85820 Personal history of malignant melanoma of skin: Secondary | ICD-10-CM | POA: Diagnosis not present

## 2022-11-01 DIAGNOSIS — D1801 Hemangioma of skin and subcutaneous tissue: Secondary | ICD-10-CM | POA: Diagnosis not present

## 2022-11-01 DIAGNOSIS — L565 Disseminated superficial actinic porokeratosis (DSAP): Secondary | ICD-10-CM | POA: Diagnosis not present

## 2022-11-01 DIAGNOSIS — C4441 Basal cell carcinoma of skin of scalp and neck: Secondary | ICD-10-CM | POA: Diagnosis not present

## 2022-11-01 DIAGNOSIS — D485 Neoplasm of uncertain behavior of skin: Secondary | ICD-10-CM | POA: Diagnosis not present

## 2022-11-01 DIAGNOSIS — C44212 Basal cell carcinoma of skin of right ear and external auricular canal: Secondary | ICD-10-CM | POA: Diagnosis not present

## 2022-11-01 DIAGNOSIS — D225 Melanocytic nevi of trunk: Secondary | ICD-10-CM | POA: Diagnosis not present

## 2022-11-01 DIAGNOSIS — Z85828 Personal history of other malignant neoplasm of skin: Secondary | ICD-10-CM | POA: Diagnosis not present

## 2022-11-25 ENCOUNTER — Encounter: Payer: Self-pay | Admitting: Cardiology

## 2022-11-25 ENCOUNTER — Ambulatory Visit: Payer: Medicare HMO | Attending: Cardiology | Admitting: Cardiology

## 2022-11-25 VITALS — BP 142/88 | HR 59 | Ht 76.5 in | Wt 220.0 lb

## 2022-11-25 DIAGNOSIS — I77810 Thoracic aortic ectasia: Secondary | ICD-10-CM | POA: Diagnosis not present

## 2022-11-25 DIAGNOSIS — I10 Essential (primary) hypertension: Secondary | ICD-10-CM

## 2022-11-25 DIAGNOSIS — I480 Paroxysmal atrial fibrillation: Secondary | ICD-10-CM

## 2022-11-25 DIAGNOSIS — E782 Mixed hyperlipidemia: Secondary | ICD-10-CM

## 2022-11-25 NOTE — Patient Instructions (Addendum)
Medication Instructions:  Your physician has recommended you make the following change in your medication:   STOP: rosuvastatin (crestor)  *If you need a refill on your cardiac medications before your next appointment, please call your pharmacy*   Lab Work: Your physician recommends that you return for a FASTING lipid profile and ALT in 3 months  If you have labs (blood work) drawn today and your tests are completely normal, you will receive your results only by: [place] (if you have MyChart) OR A paper copy in the mail If you have any lab test that is abnormal or we need to change your treatment, we will call you to review the results.   Testing/Procedures: Your physician has requested that you have an echocardiogram. Echocardiography is a painless test that uses sound waves to create images of your heart. It provides your doctor with information about the size and shape of your heart and how well your heart's chambers and valves are working. This procedure takes approximately one hour. There are no restrictions for this procedure. Please do NOT wear cologne, perfume, aftershave, or lotions (deodorant is allowed). Please arrive 15 minutes prior to your appointment time.  Follow-Up: At [facility], you and your health needs are our priority.  As part of our continuing mission to provide you with exceptional heart care, we have created designated Provider Care Teams.  These Care Teams include your primary Cardiologist (physician) and Advanced Practice Providers (APPs -  Physician Assistants and Nurse Practitioners) who all work together to provide you with the care you need, when you need it.  We recommend signing up for the patient portal called "MyChart".  Sign up information is provided on this After Visit Summary.  MyChart is used to connect with patients for Virtual Visits (Telemedicine).  Patients are able to view lab/test results, encounter notes, upcoming  appointments, etc.  Non-urgent messages can be sent to your provider as well.   To learn more about what you can do with MyChart, go to [email or web address].    Your next appointment:   12 month(s)  The format for your next appointment:   In Person  Provider:   Fransico Him, MD    Important Information About Sugar

## 2022-11-25 NOTE — Addendum Note (Signed)
Addended by: Drue Novel I on: 11/25/2022 03:12 PM   Modules accepted: Orders

## 2022-11-25 NOTE — Progress Notes (Addendum)
Cardiology Office Note:    Date:  11/25/2022   ID:  David Vargas, DOB 07/03/1951, MRN [id number]  PCP:  [facility] [place]  Cardiologist:  Fransico Him, MD    Referring MD: [facility] Jerilynn Mages*   Chief Complaint  Patient presents with   Atrial Fibrillation    History of Present Illness:    David Vargas is a 71 y.o. male with a hx of PAF that occurred once with a kidney stone and he has been treated with aspirin.  He also has hypertension, hyperlipidemia and mildly dilated aortic root.  Last echo in 2015 was normal LVEF 60-65% with trivial TR and normal aortic root.  Cardiac catheterization in 2005 for abnormal nuclear study showed no significant CAD echo probable intramyocardial mid LAD segment with minimal systolic depression.He  He had a prostate bx complicated by urosepsis and then 3 days later developed coffee ground emesis with weakness and dizziness and found to be in afib with RVR and hypotension and Hbg dropped from 14 to 9 and got a transfusion. Cardiology was consulted for Afib and underwent DCCV x 3 in ER. It was felt he had a Mallory Weiss tear and had 2 endo clips placed and also had a duodenal ulcer.  He was started on IV Amio and discharged home on PO Amio '200mg'$  daily for 21 days and then stopped it.  He was not anticoagulated due to [facility] score of 1.    He is here today for followup and is doing well.  He denies any chest pain or pressure, SOB, DOE, PND, orthopnea, LE edema, dizziness, palpitations or syncope. He is compliant with his meds and is tolerating meds with no SE.    Past Medical History:  Diagnosis Date   Arthritis    Basal cell carcinoma    followed by dermatology   Cancer [facility])    melanoma - left shoulder 8 to 9 yrs ago   Complication of anesthesia    slow to wake up   GERD (gastroesophageal reflux disease)    H/O colonoscopy    2009, polypoid colorectal mucosa found, Dr. Oletta Lamas advised repeat in 3 years   Hiatal  hernia    upper GI in 2003   History of blood transfusion    7 units given summer 2021 for wallory-weiss tear   History of hypertension 06/22/2018   no htn in last 2 years per pt on 06-22-2021 after 60 pound weight loss   History of kidney stones    History of nuclear stress test 2005   abnormal, cardiac cath done    Left inguinal hernia 06/22/2021   PAF (paroxysmal atrial fibrillation) ([place])    he has had 2 episodes in the past one associated with kidney stone and the other urosepsis.  CHA2DS2/VASC score 1 (age > 77 >>he has remote hx of HTN but this resolved after weight loss) therefore will not anticoagulate    Past Surgical History:  Procedure Laterality Date   BIOPSY PROSTATE  08/03/2020   CARDIAC CATHETERIZATION  2005   no evidence of significamt atherosclerosis   ESOPHAGOGASTRODUODENOSCOPY (EGD) WITH PROPOFOL N/A 08/11/2020   Procedure: ESOPHAGOGASTRODUODENOSCOPY (EGD) WITH PROPOFOL;  Surgeon: Ronnette Juniper, MD;  Location: [place];  Service: Gastroenterology;  Laterality: N/A;   ESOPHAGOGASTRODUODENOSCOPY (EGD) WITH PROPOFOL N/A 08/15/2020   Procedure: ESOPHAGOGASTRODUODENOSCOPY (EGD) WITH PROPOFOL;  Surgeon: Ronald Lobo, MD;  Location: [place];  Service: Endoscopy;  Laterality: N/A;   EXTRACORPOREAL SHOCK WAVE LITHOTRIPSY Right  02/17/2020   Procedure: EXTRACORPOREAL SHOCK WAVE LITHOTRIPSY (ESWL);  Surgeon: Irine Seal, MD;  Location: [facility];  Service: Urology;  Laterality: Right;   HEMOSTASIS CLIP PLACEMENT  08/11/2020   Procedure: HEMOSTASIS CLIP PLACEMENT;  Surgeon: Ronnette Juniper, MD;  Location: [facility] ENDOSCOPY;  Service: Gastroenterology;;   HEMOSTASIS CONTROL  08/11/2020   Procedure: HEMOSTASIS CONTROL;  Surgeon: Ronnette Juniper, MD;  Location: [facility] ENDOSCOPY;  Service: Gastroenterology;;   [place]   right groin   INGUINAL HERNIA REPAIR Left 06/24/2021   Procedure: LAPAROSCOPIC LEFT INGUINAL HERNIA REPAIR WITH MESH;  Surgeon: Stechschulte,  Nickola Major, MD;  Location: [place];  Service: General;  Laterality: Left;   KNEE SURGERY     left 1984, right 2010  Arthroscopic   SHOULDER SURGERY Right    rotator cuff repair   TOTAL KNEE ARTHROPLASTY Left 11/28/2017   Procedure: TOTAL KNEE ARTHROPLASTY;  Surgeon: Renette Butters, MD;  Location: [place];  Service: Orthopedics;  Laterality: Left;    Current Medications: Current Meds  Medication Sig   acetaminophen (TYLENOL) 325 MG tablet Take 650 mg by mouth every 6 (six) hours as needed for mild pain or fever.   Coenzyme Q10 (COQ10 PO) Take by mouth daily.   famotidine (PEPCID) 20 MG tablet Take 20 mg by mouth as needed.   Melatonin 5 MG TABS Take 5 mg by mouth at bedtime as needed (for sleep).    rosuvastatin (CRESTOR) 10 MG tablet Take 1 tablet (10 mg total) by mouth daily.   tadalafil (CIALIS) 10 MG tablet Take 5 mg by mouth daily as needed for erectile dysfunction.    tamsulosin (FLOMAX) 0.4 MG CAPS capsule Take 0.4 mg by mouth as needed. For kidney stones     Allergies:   Atorvastatin and Simvastatin   Social History   Socioeconomic History   Marital status: Married    Spouse name: Not on file   Number of children: Not on file   Years of education: Not on file   Highest education level: Not on file  Occupational History   Not on file  Tobacco Use   Smoking status: Never   Smokeless tobacco: Never  Vaping Use   Vaping Use: Never used  Substance and Sexual Activity   Alcohol use: Yes    Comment: 4 to 5 x week 1 drink   Drug use: No   Sexual activity: Not on file  Other Topics Concern   Not on file  Social History Narrative   Not on file   Social Determinants of Health   Financial Resource Strain: Not on file  Food Insecurity: Not on file  Transportation Needs: Not on file  Physical Activity: Not on file  Stress: Not on file  Social Connections: Not on file     Family History: The patient's family history includes Heart attack in his  mother; Liver cancer in his father; Multiple sclerosis in his sister.  ROS:   Please see the history of present illness.    ROS  All other systems reviewed and negative.   EKGs/Labs/Other Studies Reviewed:    The following studies were reviewed today:  echo, EKG  EKG:  EKG is  ordered today and demonstrates Sinus bradycardia with no ST changes  Recent Labs: No results found for requested labs within last 365 days.   Recent Lipid Panel    Component Value Date/Time   CHOL 132 12/08/2020 0812   TRIG 50 12/08/2020 9485  HDL 46 12/08/2020 0812   CHOLHDL 2.9 12/08/2020 0812   LDLCALC 75 12/08/2020 0812    Physical Exam:    VS:  BP (!) 142/88   Pulse (!) 59   Ht 6' 4.5" (1.943 m)   Wt 220 lb (99.8 kg)   SpO2 98%   BMI 26.43 kg/m     Wt Readings from Last 3 Encounters:  11/25/22 220 lb (99.8 kg)  06/24/21 223 lb 11.2 oz (101.5 kg)  02/23/21 231 lb (104.8 kg)     GEN: Well nourished, well developed in no acute distress HEENT: Normal NECK: No JVD; No carotid bruits LYMPHATICS: No lymphadenopathy CARDIAC:RRR, no murmurs, rubs, gallops RESPIRATORY:  Clear to auscultation without rales, wheezing or rhonchi  ABDOMEN: Soft, non-tender, non-distended MUSCULOSKELETAL:  No edema; No deformity  SKIN: Warm and dry NEUROLOGIC:  Alert and oriented x 3 PSYCHIATRIC:  Normal affect   ASSESSMENT:    1. PAF (paroxysmal atrial fibrillation) ([place])   2. Essential hypertension, benign   3. Ascending aorta dilatation (HCC)    PLAN:    In order of problems listed above:  1.  PAF -he is maintaining NSR on exam and denies any palpitations -His CHADS2VASC score is 1 (age>65) but he has only had 2 episodes of A. Fib in the setting of a kidney stone and 1 in the setting of sepsis that  was likely triggered by increased catecholamine surge  -he will let me know if he develops any palpitations  2.  Hx of remote HTN -resolved after significant weight loss and has no further HTN  3.   Mildly dilated ascending aorta -CTA 8/22 measured 62m -check 2D echo for reassessment  4.  HLD -LDL goal < 100 -he would like to get off statin due to achiness>>his PCP has been following it but is retiring -I told him to stop it and I will repeat an FLP and ALT In 3 months   Medication Adjustments/Labs and Tests Ordered: Current medicines are reviewed at length with the patient today.  Concerns regarding medicines are outlined above.  Orders Placed This Encounter  Procedures   EKG 12-Lead   No orders of the defined types were placed in this encounter.   Signed, TFransico Him MD  11/25/2022 2:37 PM    [place]

## 2022-11-25 NOTE — Addendum Note (Signed)
Addended by: Drue Novel I on: 11/25/2022 03:02 PM   Modules accepted: Orders

## 2023-01-04 ENCOUNTER — Ambulatory Visit: Payer: Medicare HMO | Admitting: Cardiology

## 2023-01-10 ENCOUNTER — Ambulatory Visit (HOSPITAL_COMMUNITY): Payer: Medicare HMO | Attending: Cardiology

## 2023-01-10 DIAGNOSIS — I480 Paroxysmal atrial fibrillation: Secondary | ICD-10-CM | POA: Diagnosis not present

## 2023-01-10 DIAGNOSIS — I77810 Thoracic aortic ectasia: Secondary | ICD-10-CM | POA: Diagnosis not present

## 2023-01-10 DIAGNOSIS — I10 Essential (primary) hypertension: Secondary | ICD-10-CM | POA: Diagnosis not present

## 2023-01-10 LAB — ECHOCARDIOGRAM COMPLETE
Area-P 1/2: 4.6 cm2
S' Lateral: 3.1 cm

## 2023-01-13 ENCOUNTER — Telehealth: Payer: Self-pay

## 2023-01-13 NOTE — Telephone Encounter (Signed)
Results reviewed, patient verbalizes understanding. 1 year recall for Echo scheduled.

## 2023-01-13 NOTE — Telephone Encounter (Signed)
-----  Message from Nuala Alpha, LPN sent at 07/26/6552 10:05 AM EST -----  ----- Message ----- From: Sueanne Margarita, MD Sent: 01/10/2023   2:15 PM EST To: [facility] Triage  2D echo with normal heart function, mildly thickened heart muscle, mildly dilated ascending aorta at 71m.  Repeat echo in 1  year for dilated aorta.  Echo read out as dilated RV but I have personally reviewed the images the the measurement was incorrect and RV appears normal.

## 2023-01-18 DIAGNOSIS — C44212 Basal cell carcinoma of skin of right ear and external auricular canal: Secondary | ICD-10-CM | POA: Diagnosis not present

## 2023-02-01 DIAGNOSIS — R972 Elevated prostate specific antigen [PSA]: Secondary | ICD-10-CM | POA: Diagnosis not present

## 2023-02-14 ENCOUNTER — Other Ambulatory Visit: Payer: Self-pay | Admitting: Urology

## 2023-02-14 DIAGNOSIS — R972 Elevated prostate specific antigen [PSA]: Secondary | ICD-10-CM

## 2023-02-28 ENCOUNTER — Ambulatory Visit: Payer: Medicare HMO | Attending: Cardiology

## 2023-02-28 DIAGNOSIS — E782 Mixed hyperlipidemia: Secondary | ICD-10-CM | POA: Diagnosis not present

## 2023-03-01 ENCOUNTER — Telehealth: Payer: Self-pay

## 2023-03-01 DIAGNOSIS — Z79899 Other long term (current) drug therapy: Secondary | ICD-10-CM

## 2023-03-01 DIAGNOSIS — E782 Mixed hyperlipidemia: Secondary | ICD-10-CM

## 2023-03-01 LAB — LIPID PANEL
Chol/HDL Ratio: 3.7 ratio (ref 0.0–5.0)
Cholesterol, Total: 203 mg/dL — ABNORMAL HIGH (ref 100–199)
HDL: 55 mg/dL (ref >39)
LDL Chol Calc (NIH): 137 mg/dL — ABNORMAL HIGH (ref 0–99)
Triglycerides: 60 mg/dL (ref 0–149)
VLDL Cholesterol Cal: 11 mg/dL (ref 5–40)

## 2023-03-01 LAB — ALT: ALT: 12 IU/L (ref 0–44)

## 2023-03-01 MED ORDER — EZETIMIBE 10 MG PO TABS: 10.0000 mg | ORAL_TABLET | Freq: Every day | ORAL | 3 refills | Status: DC

## 2023-03-01 NOTE — Telephone Encounter (Signed)
Reviewed results with patient, LDL above goal per Dr. Gasper Sells. Discussed recommendations to start zetia 10 mg daily. Patient verbalizes understanding. Fasting lipid panel and ALT ordered and scheduled for June 08, 2023.

## 2023-03-01 NOTE — Telephone Encounter (Signed)
-----   Message from Werner Lean, MD sent at 03/01/2023  1:43 PM EST ----- Results: LDL above goal Plan: Given statin issues Zetia 10 and Lipids and ALT in 3 months  Werner Lean, MD

## 2023-03-14 ENCOUNTER — Encounter: Payer: Self-pay | Admitting: Urology

## 2023-03-17 ENCOUNTER — Ambulatory Visit
Admission: RE | Admit: 2023-03-17 | Discharge: 2023-03-17 | Disposition: A | Discharge status: Home / Self Care | Payer: Medicare HMO | Source: Ambulatory Visit | Attending: Urology | Admitting: Urology

## 2023-03-17 DIAGNOSIS — R972 Elevated prostate specific antigen [PSA]: Secondary | ICD-10-CM | POA: Diagnosis not present

## 2023-03-17 MED ORDER — GADOPICLENOL 0.5 MMOL/ML IV SOLN
10.0000 mL | Freq: Once | INTRAVENOUS | Status: AC | PRN
  Administered 2023-03-17: 10 mL via INTRAVENOUS

## 2023-04-12 DIAGNOSIS — R3912 Poor urinary stream: Secondary | ICD-10-CM | POA: Diagnosis not present

## 2023-04-12 DIAGNOSIS — N401 Enlarged prostate with lower urinary tract symptoms: Secondary | ICD-10-CM | POA: Diagnosis not present

## 2023-04-12 DIAGNOSIS — R351 Nocturia: Secondary | ICD-10-CM | POA: Diagnosis not present

## 2023-04-12 DIAGNOSIS — N5201 Erectile dysfunction due to arterial insufficiency: Secondary | ICD-10-CM | POA: Diagnosis not present

## 2023-04-12 DIAGNOSIS — R972 Elevated prostate specific antigen [PSA]: Secondary | ICD-10-CM | POA: Diagnosis not present

## 2023-04-26 DIAGNOSIS — R972 Elevated prostate specific antigen [PSA]: Secondary | ICD-10-CM | POA: Diagnosis not present

## 2023-05-04 DIAGNOSIS — L570 Actinic keratosis: Secondary | ICD-10-CM | POA: Diagnosis not present

## 2023-05-04 DIAGNOSIS — L821 Other seborrheic keratosis: Secondary | ICD-10-CM | POA: Diagnosis not present

## 2023-05-04 DIAGNOSIS — Z85820 Personal history of malignant melanoma of skin: Secondary | ICD-10-CM | POA: Diagnosis not present

## 2023-05-04 DIAGNOSIS — D2271 Melanocytic nevi of right lower limb, including hip: Secondary | ICD-10-CM | POA: Diagnosis not present

## 2023-05-04 DIAGNOSIS — D225 Melanocytic nevi of trunk: Secondary | ICD-10-CM | POA: Diagnosis not present

## 2023-05-04 DIAGNOSIS — Z85828 Personal history of other malignant neoplasm of skin: Secondary | ICD-10-CM | POA: Diagnosis not present

## 2023-05-04 DIAGNOSIS — L820 Inflamed seborrheic keratosis: Secondary | ICD-10-CM | POA: Diagnosis not present

## 2023-05-04 DIAGNOSIS — D2272 Melanocytic nevi of left lower limb, including hip: Secondary | ICD-10-CM | POA: Diagnosis not present

## 2023-05-04 DIAGNOSIS — D1801 Hemangioma of skin and subcutaneous tissue: Secondary | ICD-10-CM | POA: Diagnosis not present

## 2023-05-04 DIAGNOSIS — L905 Scar conditions and fibrosis of skin: Secondary | ICD-10-CM | POA: Diagnosis not present

## 2023-05-08 NOTE — Progress Notes (Signed)
GU Location of Tumor / Histology: Prostate Ca  If Prostate Cancer, Gleason Score is (4 + 3) and PSA is (7.82 on 01/2023)  Biopsies     03/17/2023 Dr. Bjorn Pippin MR Prostate with/without Contrast CLINICAL DATA: Elevated PSA   IMPRESSION: Lesion 1 - 7 mm peripheral zone nodule in the right posteromedial apex, suspicious for high-grade carcinoma. PI-RADS 4 (v2.1): High (clinically significant cancer likely).   Lesion 2 - 12 mm peripheral zone nodule in the left posterolateral apex, suspicious for high-grade carcinoma. PI-RADS 4 (v2.1): High (clinically significant cancer likely).   Lesion 3 - 12 mm transition zone nodule in the right posterior mid gland, indeterminate. PI-RADS 3 (v2.1): Intermediate (clinically significant cancer equivocal).   No evidence of extracapsular extension or pelvic metastatic disease.   Past/Anticipated interventions by urology, if any: NA  Past/Anticipated interventions by medical oncology, if any: NA  Weight changes, if any: {:18581}  IPSS: SHIM:  Bowel/Bladder complaints, if any: {:18581}   Nausea/Vomiting, if any: {:18581}  Pain issues, if any:  {:18581}  SAFETY ISSUES: Prior radiation? {:18581} Pacemaker/ICD? {:18581} Possible current pregnancy? Male Is the patient on methotrexate? No  Current Complaints / other details:  ***

## 2023-05-09 ENCOUNTER — Encounter: Payer: Self-pay | Admitting: Radiation Oncology

## 2023-05-09 ENCOUNTER — Ambulatory Visit
Admission: RE | Admit: 2023-05-09 | Discharge: 2023-05-09 | Disposition: A | Discharge status: Home / Self Care | Payer: Medicare HMO | Source: Ambulatory Visit | Attending: Radiation Oncology | Admitting: Radiation Oncology

## 2023-05-09 VITALS — BP 166/95 | HR 71 | Temp 98.2°F | Resp 20 | Wt 231.4 lb

## 2023-05-09 DIAGNOSIS — C61 Malignant neoplasm of prostate: Secondary | ICD-10-CM | POA: Insufficient documentation

## 2023-05-09 DIAGNOSIS — Z800 Family history of malignant neoplasm of digestive organs: Secondary | ICD-10-CM | POA: Diagnosis not present

## 2023-05-09 DIAGNOSIS — Z87442 Personal history of urinary calculi: Secondary | ICD-10-CM | POA: Insufficient documentation

## 2023-05-09 DIAGNOSIS — K449 Diaphragmatic hernia without obstruction or gangrene: Secondary | ICD-10-CM | POA: Insufficient documentation

## 2023-05-09 DIAGNOSIS — Z85828 Personal history of other malignant neoplasm of skin: Secondary | ICD-10-CM | POA: Insufficient documentation

## 2023-05-09 DIAGNOSIS — I480 Paroxysmal atrial fibrillation: Secondary | ICD-10-CM | POA: Diagnosis not present

## 2023-05-09 DIAGNOSIS — K219 Gastro-esophageal reflux disease without esophagitis: Secondary | ICD-10-CM | POA: Insufficient documentation

## 2023-05-09 DIAGNOSIS — Z85820 Personal history of malignant melanoma of skin: Secondary | ICD-10-CM | POA: Diagnosis not present

## 2023-05-09 DIAGNOSIS — I10 Essential (primary) hypertension: Secondary | ICD-10-CM | POA: Diagnosis not present

## 2023-05-09 DIAGNOSIS — Z79899 Other long term (current) drug therapy: Secondary | ICD-10-CM | POA: Diagnosis not present

## 2023-05-09 DIAGNOSIS — Z191 Hormone sensitive malignancy status: Secondary | ICD-10-CM | POA: Diagnosis not present

## 2023-05-09 NOTE — Progress Notes (Signed)
Radiation Oncology         [phone] ________________________________  Initial Outpatient Consultation  Name: David Vargas MRN: [id number]  Date: 05/09/2023  DOB: 1951-10-01  VH:[facility], [facility] @ [facility] Mussel, Jonny Ruiz, MD   REFERRING PHYSICIAN: Bjorn Pippin, MD  DIAGNOSIS: 72 y.o. gentleman with Stage T1c adenocarcinoma of the prostate with Gleason score of 4+3, and PSA of 7.82.    ICD-10-CM   1. Malignant neoplasm of prostate (HCC)  C61       HISTORY OF PRESENT ILLNESS: David Vargas is a 72 y.o. male with a diagnosis of prostate cancer. He has a history of urolithiasis and nephrolithiasis dating back to at least 2014, s/p lithotripsy in 01/2020 with Dr. Annabell Howells. He also has a history of elevated, fluctuating PSA that remained under 5 until 05/2020. This prompted a prostate biopsy in 07/2020, which showed only one area of HG/PIN but no evidence of malignancy.  Unfortunately, he had urosepsis status post biopsy and spent 10 days in the hospital on IV antibiotics.  His PSA did decrease to 4.74 in 05/2021 but increased to 5.97 in 07/14/2022 and most recently, reached 7.82 in 01/2023. He underwent prostate MRI on 03/17/23 showing a 7 mm PI-RADS 4 lesion in the peripheral zone in the left posteromedial apex (ROI #1) and a 12 mm PI-RADS 4 lesion in the peripheral zone in left posterolateral apex (ROI #2). additionally, there was a 12 mm PI-RADS 3 lesion in the transition zone in the right posterior mid gland (ROI #3) but no evidence of extracapsular extension or pelvic metastatic disease. The patient proceeded to MRI fusion biopsy of the prostate on 04/26/23.  The prostate volume measured 83 cc.  Out of 21 core biopsies, 4 were positive.  The maximum Gleason score was 4+3, and this was seen in 1 of 3 cores from ROI #1 and one of three cores from ROI #3 (small focus). Additionally, Gleason 3+3 was seen in the left mid lateral and left apex.  Fortunately, he has not had any signs/symptoms of  infection following this biopsy procedure.  The patient reviewed the biopsy results with his urologist and he has kindly been referred today for discussion of potential radiation treatment options.  He is accompanied by his wife, Bonita Quin for today's visit.   PREVIOUS RADIATION THERAPY: No  PAST MEDICAL HISTORY:  Past Medical History:  Diagnosis Date   Arthritis    Basal cell carcinoma    followed by dermatology   Cancer [facility])    melanoma - left shoulder 8 to 9 yrs ago   Complication of anesthesia    slow to wake up   GERD (gastroesophageal reflux disease)    H/O colonoscopy    2009, polypoid colorectal mucosa found, Dr. Randa Evens advised repeat in 3 years   Hiatal hernia    upper GI in 2003   History of blood transfusion    7 units given summer 2021 for wallory-weiss tear   History of hypertension 06/22/2018   no htn in last 2 years per pt on 06-22-2021 after 60 pound weight loss   History of kidney stones    History of nuclear stress test 2005   abnormal, cardiac cath done    Left inguinal hernia 06/22/2021   PAF (paroxysmal atrial fibrillation) (HCC)    he has had 2 episodes in the past one associated with kidney stone and the other urosepsis.  CHA2DS2/VASC score 1 (age > 54 >>he has remote hx of HTN but  this resolved after weight loss) therefore will not anticoagulate      PAST SURGICAL HISTORY: Past Surgical History:  Procedure Laterality Date   BIOPSY PROSTATE  08/03/2020   CARDIAC CATHETERIZATION  2005   no evidence of significamt atherosclerosis   ESOPHAGOGASTRODUODENOSCOPY (EGD) WITH PROPOFOL N/A 08/11/2020   Procedure: ESOPHAGOGASTRODUODENOSCOPY (EGD) WITH PROPOFOL;  Surgeon: Kerin Salen, MD;  Location: [facility] ENDOSCOPY;  Service: Gastroenterology;  Laterality: N/A;   ESOPHAGOGASTRODUODENOSCOPY (EGD) WITH PROPOFOL N/A 08/15/2020   Procedure: ESOPHAGOGASTRODUODENOSCOPY (EGD) WITH PROPOFOL;  Surgeon: Bernette Redbird, MD;  Location: [facility] ENDOSCOPY;  Service: Endoscopy;   Laterality: N/A;   EXTRACORPOREAL SHOCK WAVE LITHOTRIPSY Right 02/17/2020   Procedure: EXTRACORPOREAL SHOCK WAVE LITHOTRIPSY (ESWL);  Surgeon: Bjorn Pippin, MD;  Location[facility];  Service: Urology;  Laterality: Right;   HEMOSTASIS CLIP PLACEMENT  08/11/2020   Procedure: HEMOSTASIS CLIP PLACEMENT;  Surgeon: Kerin Salen, MD;  Location: [facility] ENDOSCOPY;  Service: Gastroenterology;;   HEMOSTASIS CONTROL  08/11/2020   Procedure: HEMOSTASIS CONTROL;  Surgeon: Kerin Salen, MD;  Location: [facility] ENDOSCOPY;  Service: Gastroenterology;;   HERNIA REPAIR  1990   right groin   INGUINAL HERNIA REPAIR Left 06/24/2021   Procedure: LAPAROSCOPIC LEFT INGUINAL HERNIA REPAIR WITH MESH;  Surgeon: Stechschulte, Hyman Hopes, MD;  Location: [facility];  Service: General;  Laterality: Left;   KNEE SURGERY     left 1984, right 2010  Arthroscopic   SHOULDER SURGERY Right    rotator cuff repair   TOTAL KNEE ARTHROPLASTY Left 11/28/2017   Procedure: TOTAL KNEE ARTHROPLASTY;  Surgeon: Sheral Apley, MD;  Location: [facility] OR;  Service: Orthopedics;  Laterality: Left;    FAMILY HISTORY:  Family History  Problem Relation Age of Onset   Heart attack Mother    Liver cancer Father    Multiple sclerosis Sister     SOCIAL HISTORY: He and his wife live in [place] and he is an avid Teacher, English as a foreign language. Social History   Socioeconomic History   Marital status: Married    Spouse name: Not on file   Number of children: Not on file   Years of education: Not on file   Highest education level: Not on file  Occupational History   Not on file  Tobacco Use   Smoking status: Never   Smokeless tobacco: Never  Vaping Use   Vaping Use: Never used  Substance and Sexual Activity   Alcohol use: Yes    Comment: 4 to 5 x week 1 drink   Drug use: No   Sexual activity: Not on file  Other Topics Concern   Not on file  Social History Narrative   Not on file   Social Determinants of Health   Financial Resource  Strain: Not on file  Food Insecurity: Not on file  Transportation Needs: Not on file  Physical Activity: Not on file  Stress: Not on file  Social Connections: Not on file  Intimate Partner Violence: Not on file    ALLERGIES: Atorvastatin and Simvastatin  MEDICATIONS:  Current Outpatient Medications  Medication Sig Dispense Refill   COMIRNATY syringe      ezetimibe (ZETIA) 10 MG tablet Take 1 tablet (10 mg total) by mouth daily. 90 tablet 3   fluticasone (FLONASE) 50 MCG/ACT nasal spray Place into both nostrils.     acetaminophen (TYLENOL) 325 MG tablet Take 650 mg by mouth every 6 (six) hours as needed for mild pain or fever.     Coenzyme Q10 (COQ10 PO) Take by  mouth daily.     famotidine (PEPCID) 20 MG tablet Take 20 mg by mouth as needed.     pantoprazole (PROTONIX) 40 MG tablet Take 1 tablet (40 mg total) by mouth daily. 30 tablet 0   tadalafil (CIALIS) 10 MG tablet Take 5 mg by mouth daily as needed for erectile dysfunction.      tamsulosin (FLOMAX) 0.4 MG CAPS capsule Take 0.4 mg by mouth as needed. For kidney stones     No current facility-administered medications for this encounter.    REVIEW OF SYSTEMS:  On review of systems, the patient reports that he is doing well overall. He denies any chest pain, shortness of breath, cough, fevers, chills, night sweats, unintended weight changes. He denies any bowel disturbances, and denies abdominal pain, nausea or vomiting. He denies any new musculoskeletal or joint aches or pains. His IPSS was 11, indicating mild-moderate urinary symptoms on Flomax daily as prescribed. His SHIM was 8, indicating he has moderate erectile dysfunction. A complete review of systems is obtained and is otherwise negative.    PHYSICAL EXAM:  Wt Readings from Last 3 Encounters:  05/09/23 231 lb 6.4 oz (105 kg)  11/25/22 220 lb (99.8 kg)  06/24/21 223 lb 11.2 oz (101.5 kg)   Temp Readings from Last 3 Encounters:  05/09/23 98.2 F (36.8 C)  06/24/21 (!)  97.4 F (36.3 C)  08/15/20 97.8 F (36.6 C) (Oral)   BP Readings from Last 3 Encounters:  05/09/23 (!) 166/95  11/25/22 (!) 142/88  06/24/21 (!) 149/92   Pulse Readings from Last 3 Encounters:  05/09/23 71  11/25/22 (!) 59  06/24/21 76   Pain Assessment Pain Score: 0-No pain/10  In general this is a well appearing Caucasian male in no acute distress. He's alert and oriented x4 and appropriate throughout the examination. Cardiopulmonary assessment is negative for acute distress, and he exhibits normal effort.    KPS = 100  100 - Normal; no complaints; no evidence of disease. 90   - Able to carry on normal activity; minor signs or symptoms of disease. 80   - Normal activity with effort; some signs or symptoms of disease. 19   - Cares for self; unable to carry on normal activity or to do active work. 60   - Requires occasional assistance, but is able to care for most of his personal needs. 50   - Requires considerable assistance and frequent medical care. 40   - Disabled; requires special care and assistance. 30   - Severely disabled; hospital admission is indicated although death not imminent. 20   - Very sick; hospital admission necessary; active supportive treatment necessary. 10   - Moribund; fatal processes progressing rapidly. 0     - Dead  Karnofsky DA, Abelmann WH, Craver LS and [facility] [phone]) The use of the nitrogen mustards in the palliative treatment of carcinoma: with particular reference to bronchogenic carcinoma Cancer 1 634-56  LABORATORY DATA:  Lab Results  Component Value Date   WBC 11.9 (H) 08/14/2020   HGB 16.0 06/24/2021   HCT 47.0 06/24/2021   MCV 92.7 08/14/2020   PLT 186 08/14/2020   Lab Results  Component Value Date   NA 142 08/10/2021   K 4.3 08/10/2021   CL 104 08/10/2021   CO2 24 08/10/2021   Lab Results  Component Value Date   ALT 12 02/28/2023   AST 44 (H) 08/06/2020   ALKPHOS 54 08/06/2020   BILITOT 0.8 08/06/2020  RADIOGRAPHY: No results found.    IMPRESSION/PLAN: 1. 72 y.o. gentleman with Stage T1c adenocarcinoma of the prostate with Gleason Score of 4+3, and PSA of 7.82. We discussed the patient's workup and outlined the nature of prostate cancer in this setting. The patient's T stage, Gleason's score, and PSA put him into the unfavorable intermediate risk group. Accordingly, he is eligible for a variety of potential treatment options including brachytherapy, 5.5 weeks of external radiation, or prostatectomy. We discussed the available radiation techniques, and focused on the details and logistics of delivery. The patient is not an ideal candidate for brachytherapy with a prostate volume of 83 cc and he is not interested in brachytherapy given his prior experience with urosepsis following his biopsy in 2021. We discussed and outlined the risks, benefits, short and long-term effects associated with radiotherapy and compared and contrasted these with prostatectomy. We discussed the role of SpaceOAR gel in reducing the rectal toxicity associated with radiotherapy.  We also briefly discussed the role of ST-ADT in the management of unfavorable intermediate risk prostate cancer but do not feel strongly that this is necessary given his low-volume disease.  We feel that the negative impact on his quality of life from the side effects of ADT outweigh the potential small benefit in his case since this has not proven to have any impact on overall survival in this setting.  He and his wife, Bonita Quin, were encouraged to ask questions that were answered to their stated satisfaction.  At the conclusion of our conversation, the patient is interested in moving forward with 5.5 weeks of external beam therapy without ADT. We will share our discussion with Dr. Annabell Howells and get his opinion on whether or not to proceed with fiducial markers and SpaceOAR gel placement, prior to simulation, given his prior history of postprocedure urosepsis. The  patient appears to have a good understanding of his disease and our treatment recommendations which are of curative intent and is in agreement with the stated plan.  Therefore, we will contact the patient once we have heard from Dr. Annabell Howells regarding whether or not to proceed with the procedure for fiducial marker and SpaceOAR gel placement and we will move forward with treatment planning accordingly, in anticipation of beginning IMRT in the near future.  They are planning to do some traveling in August and would like to have his treatment completed prior to that time.  We enjoyed meeting him and his wife and look forward to continuing to participate in his care.  We personally spent 70 minutes in this encounter including chart review, reviewing radiological studies, meeting face-to-face with the patient, entering orders and completing documentation.    Marguarite Arbour, PA-C    Margaretmary Dys, MD  [facility]  Radiation Oncology Direct Dial: [phone]  Fax: [phone] [email or web address]  Skype  LinkedIn

## 2023-05-09 NOTE — Progress Notes (Addendum)
Introduced myself to the patient, and his wife, as the prostate nurse navigator.  No barriers to care identified at this time.  He is here to discuss his radiation treatment options, and does have a surgical consult with Dr. Laverle Patter on 6/4 that he wishes to cancel.  I gave him my business card and asked him to call me with questions or concerns.  Verbalized understanding.

## 2023-05-11 DIAGNOSIS — Z191 Hormone sensitive malignancy status: Secondary | ICD-10-CM | POA: Diagnosis not present

## 2023-05-11 DIAGNOSIS — C61 Malignant neoplasm of prostate: Secondary | ICD-10-CM | POA: Diagnosis not present

## 2023-05-11 NOTE — Progress Notes (Signed)
RN notified patient that per Dr. Annabell Howells recommendations we will forego fiducial marker's and spaceOAR gel placement.  Pt verbalized understanding.  Pt aware that we are awaiting date for CT Simulation and he will be contacted.  No additional needs at this time.

## 2023-05-17 ENCOUNTER — Telehealth: Payer: Self-pay | Admitting: *Deleted

## 2023-05-17 NOTE — Telephone Encounter (Signed)
CALLED PATIENT TO REMIND OF SIM APPT. FOR 05-18-23- ARRIVAL TIME- 8:45 AM @ [facility], LVM FOR A RETURN CALL

## 2023-05-18 ENCOUNTER — Ambulatory Visit
Admission: RE | Admit: 2023-05-18 | Discharge: 2023-05-18 | Disposition: A | Discharge status: Home / Self Care | Payer: Medicare HMO | Source: Ambulatory Visit | Attending: Radiation Oncology | Admitting: Radiation Oncology

## 2023-05-18 DIAGNOSIS — C61 Malignant neoplasm of prostate: Secondary | ICD-10-CM | POA: Diagnosis not present

## 2023-05-18 DIAGNOSIS — Z510 Encounter for antineoplastic radiation therapy: Secondary | ICD-10-CM | POA: Insufficient documentation

## 2023-05-18 DIAGNOSIS — Z191 Hormone sensitive malignancy status: Secondary | ICD-10-CM | POA: Diagnosis not present

## 2023-05-18 NOTE — Progress Notes (Signed)
  Radiation Oncology         [phone] ________________________________  Name: David Vargas MRN: [id number]  Date: 05/18/2023  DOB: 01-16-1951  SIMULATION AND TREATMENT PLANNING NOTE    ICD-10-CM   1. Malignant neoplasm of prostate (HCC)  C61       DIAGNOSIS:   72 y.o. gentleman with Stage T1c adenocarcinoma of the prostate with Gleason score of 4+3, and PSA of 7.82.   NARRATIVE:  The patient was brought to the CT Simulation planning suite.  Identity was confirmed.  All relevant records and images related to the planned course of therapy were reviewed.  The patient freely provided informed written consent to proceed with treatment after reviewing the details related to the planned course of therapy. The consent form was witnessed and verified by the simulation staff.  Then, the patient was set-up in a stable reproducible supine position for radiation therapy.  A vacuum lock pillow device was custom fabricated to position his legs in a reproducible immobilized position.  Then, I performed a urethrogram under sterile conditions to identify the prostatic apex.  CT images were obtained.  Surface markings were placed.  The CT images were loaded into the planning software.  Then the prostate target and avoidance structures including the rectum, bladder, bowel and hips were contoured.  Treatment planning then occurred.  The radiation prescription was entered and confirmed.  A total of one complex treatment devices was fabricated. I have requested : Intensity Modulated Radiotherapy (IMRT) is medically necessary for this case for the following reason:  Rectal sparing.Marland Kitchen  PLAN:  The patient will receive 70 Gy in 28 fractions.  ________________________________  Artist Pais Kathrynn Running, M.D.

## 2023-05-23 DIAGNOSIS — Z191 Hormone sensitive malignancy status: Secondary | ICD-10-CM | POA: Diagnosis not present

## 2023-05-23 DIAGNOSIS — Z510 Encounter for antineoplastic radiation therapy: Secondary | ICD-10-CM | POA: Diagnosis not present

## 2023-05-23 DIAGNOSIS — C61 Malignant neoplasm of prostate: Secondary | ICD-10-CM | POA: Diagnosis not present

## 2023-06-01 ENCOUNTER — Other Ambulatory Visit: Payer: Self-pay

## 2023-06-01 ENCOUNTER — Ambulatory Visit
Admission: RE | Admit: 2023-06-01 | Discharge: 2023-06-01 | Disposition: A | Discharge status: Home / Self Care | Payer: Medicare HMO | Source: Ambulatory Visit | Attending: Radiation Oncology | Admitting: Radiation Oncology

## 2023-06-01 DIAGNOSIS — C61 Malignant neoplasm of prostate: Secondary | ICD-10-CM | POA: Diagnosis not present

## 2023-06-01 DIAGNOSIS — Z510 Encounter for antineoplastic radiation therapy: Secondary | ICD-10-CM | POA: Insufficient documentation

## 2023-06-01 DIAGNOSIS — Z191 Hormone sensitive malignancy status: Secondary | ICD-10-CM | POA: Diagnosis not present

## 2023-06-01 LAB — RAD ONC ARIA SESSION SUMMARY
Course Elapsed Days: 0
Plan Fractions Treated to Date: 1
Plan Prescribed Dose Per Fraction: 2.5 Gy
Plan Total Fractions Prescribed: 28
Plan Total Prescribed Dose: 70 Gy
Reference Point Dosage Given to Date: 2.5 Gy
Reference Point Session Dosage Given: 2.5 Gy
Session Number: 1

## 2023-06-02 ENCOUNTER — Other Ambulatory Visit: Payer: Self-pay

## 2023-06-02 ENCOUNTER — Ambulatory Visit
Admission: RE | Admit: 2023-06-02 | Discharge: 2023-06-02 | Disposition: A | Discharge status: Home / Self Care | Payer: Medicare HMO | Source: Ambulatory Visit | Attending: Radiation Oncology | Admitting: Radiation Oncology

## 2023-06-02 DIAGNOSIS — E785 Hyperlipidemia, unspecified: Secondary | ICD-10-CM | POA: Diagnosis not present

## 2023-06-02 DIAGNOSIS — D509 Iron deficiency anemia, unspecified: Secondary | ICD-10-CM | POA: Diagnosis not present

## 2023-06-02 DIAGNOSIS — Z510 Encounter for antineoplastic radiation therapy: Secondary | ICD-10-CM | POA: Diagnosis not present

## 2023-06-02 DIAGNOSIS — C61 Malignant neoplasm of prostate: Secondary | ICD-10-CM | POA: Diagnosis not present

## 2023-06-02 DIAGNOSIS — Z191 Hormone sensitive malignancy status: Secondary | ICD-10-CM | POA: Diagnosis not present

## 2023-06-02 DIAGNOSIS — E559 Vitamin D deficiency, unspecified: Secondary | ICD-10-CM | POA: Diagnosis not present

## 2023-06-02 LAB — RAD ONC ARIA SESSION SUMMARY
Course Elapsed Days: 1
Plan Fractions Treated to Date: 2
Plan Prescribed Dose Per Fraction: 2.5 Gy
Plan Total Fractions Prescribed: 28
Plan Total Prescribed Dose: 70 Gy
Reference Point Dosage Given to Date: 5 Gy
Reference Point Session Dosage Given: 2.5 Gy
Session Number: 2

## 2023-06-03 ENCOUNTER — Encounter: Payer: Self-pay | Admitting: Cardiology

## 2023-06-05 ENCOUNTER — Other Ambulatory Visit: Payer: Self-pay

## 2023-06-05 ENCOUNTER — Ambulatory Visit
Admission: RE | Admit: 2023-06-05 | Discharge: 2023-06-05 | Disposition: A | Discharge status: Home / Self Care | Payer: Medicare HMO | Source: Ambulatory Visit | Attending: Radiation Oncology | Admitting: Radiation Oncology

## 2023-06-05 DIAGNOSIS — Z510 Encounter for antineoplastic radiation therapy: Secondary | ICD-10-CM | POA: Diagnosis not present

## 2023-06-05 DIAGNOSIS — C61 Malignant neoplasm of prostate: Secondary | ICD-10-CM | POA: Diagnosis not present

## 2023-06-05 DIAGNOSIS — Z191 Hormone sensitive malignancy status: Secondary | ICD-10-CM | POA: Diagnosis not present

## 2023-06-05 LAB — RAD ONC ARIA SESSION SUMMARY
Course Elapsed Days: 4
Plan Fractions Treated to Date: 3
Plan Prescribed Dose Per Fraction: 2.5 Gy
Plan Total Fractions Prescribed: 28
Plan Total Prescribed Dose: 70 Gy
Reference Point Dosage Given to Date: 7.5 Gy
Reference Point Session Dosage Given: 2.5 Gy
Session Number: 3

## 2023-06-06 ENCOUNTER — Other Ambulatory Visit: Payer: Self-pay

## 2023-06-06 ENCOUNTER — Ambulatory Visit
Admission: RE | Admit: 2023-06-06 | Discharge: 2023-06-06 | Disposition: A | Discharge status: Home / Self Care | Payer: Medicare HMO | Source: Ambulatory Visit | Attending: Radiation Oncology | Admitting: Radiation Oncology

## 2023-06-06 DIAGNOSIS — J309 Allergic rhinitis, unspecified: Secondary | ICD-10-CM | POA: Diagnosis not present

## 2023-06-06 DIAGNOSIS — E785 Hyperlipidemia, unspecified: Secondary | ICD-10-CM | POA: Diagnosis not present

## 2023-06-06 DIAGNOSIS — Z191 Hormone sensitive malignancy status: Secondary | ICD-10-CM | POA: Diagnosis not present

## 2023-06-06 DIAGNOSIS — N529 Male erectile dysfunction, unspecified: Secondary | ICD-10-CM | POA: Diagnosis not present

## 2023-06-06 DIAGNOSIS — K219 Gastro-esophageal reflux disease without esophagitis: Secondary | ICD-10-CM | POA: Diagnosis not present

## 2023-06-06 DIAGNOSIS — Z0000 Encounter for general adult medical examination without abnormal findings: Secondary | ICD-10-CM | POA: Diagnosis not present

## 2023-06-06 DIAGNOSIS — I77810 Thoracic aortic ectasia: Secondary | ICD-10-CM | POA: Diagnosis not present

## 2023-06-06 DIAGNOSIS — Z510 Encounter for antineoplastic radiation therapy: Secondary | ICD-10-CM | POA: Diagnosis not present

## 2023-06-06 DIAGNOSIS — Z6828 Body mass index (BMI) 28.0-28.9, adult: Secondary | ICD-10-CM | POA: Diagnosis not present

## 2023-06-06 DIAGNOSIS — I480 Paroxysmal atrial fibrillation: Secondary | ICD-10-CM | POA: Diagnosis not present

## 2023-06-06 DIAGNOSIS — J432 Centrilobular emphysema: Secondary | ICD-10-CM | POA: Diagnosis not present

## 2023-06-06 DIAGNOSIS — D509 Iron deficiency anemia, unspecified: Secondary | ICD-10-CM | POA: Diagnosis not present

## 2023-06-06 DIAGNOSIS — C61 Malignant neoplasm of prostate: Secondary | ICD-10-CM | POA: Diagnosis not present

## 2023-06-06 DIAGNOSIS — Z86010 Personal history of colonic polyps: Secondary | ICD-10-CM | POA: Diagnosis not present

## 2023-06-06 LAB — RAD ONC ARIA SESSION SUMMARY
Course Elapsed Days: 5
Plan Fractions Treated to Date: 4
Plan Prescribed Dose Per Fraction: 2.5 Gy
Plan Total Fractions Prescribed: 28
Plan Total Prescribed Dose: 70 Gy
Reference Point Dosage Given to Date: 10 Gy
Reference Point Session Dosage Given: 2.5 Gy
Session Number: 4

## 2023-06-07 ENCOUNTER — Other Ambulatory Visit: Payer: Self-pay

## 2023-06-07 ENCOUNTER — Ambulatory Visit
Admission: RE | Admit: 2023-06-07 | Discharge: 2023-06-07 | Disposition: A | Discharge status: Home / Self Care | Payer: Medicare HMO | Source: Ambulatory Visit | Attending: Radiation Oncology | Admitting: Radiation Oncology

## 2023-06-07 DIAGNOSIS — Z191 Hormone sensitive malignancy status: Secondary | ICD-10-CM | POA: Diagnosis not present

## 2023-06-07 DIAGNOSIS — C61 Malignant neoplasm of prostate: Secondary | ICD-10-CM | POA: Diagnosis not present

## 2023-06-07 DIAGNOSIS — Z510 Encounter for antineoplastic radiation therapy: Secondary | ICD-10-CM | POA: Diagnosis not present

## 2023-06-07 LAB — RAD ONC ARIA SESSION SUMMARY
Course Elapsed Days: 6
Plan Fractions Treated to Date: 5
Plan Prescribed Dose Per Fraction: 2.5 Gy
Plan Total Fractions Prescribed: 28
Plan Total Prescribed Dose: 70 Gy
Reference Point Dosage Given to Date: 12.5 Gy
Reference Point Session Dosage Given: 2.5 Gy
Session Number: 5

## 2023-06-08 ENCOUNTER — Ambulatory Visit: Payer: Medicare HMO

## 2023-06-08 ENCOUNTER — Ambulatory Visit
Admission: RE | Admit: 2023-06-08 | Discharge: 2023-06-08 | Disposition: A | Discharge status: Home / Self Care | Payer: Medicare HMO | Source: Ambulatory Visit | Attending: Radiation Oncology | Admitting: Radiation Oncology

## 2023-06-08 ENCOUNTER — Other Ambulatory Visit: Payer: Self-pay

## 2023-06-08 DIAGNOSIS — Z191 Hormone sensitive malignancy status: Secondary | ICD-10-CM | POA: Diagnosis not present

## 2023-06-08 DIAGNOSIS — Z510 Encounter for antineoplastic radiation therapy: Secondary | ICD-10-CM | POA: Diagnosis not present

## 2023-06-08 DIAGNOSIS — C61 Malignant neoplasm of prostate: Secondary | ICD-10-CM | POA: Diagnosis not present

## 2023-06-08 LAB — RAD ONC ARIA SESSION SUMMARY
Course Elapsed Days: 7
Plan Fractions Treated to Date: 6
Plan Prescribed Dose Per Fraction: 2.5 Gy
Plan Total Fractions Prescribed: 28
Plan Total Prescribed Dose: 70 Gy
Reference Point Dosage Given to Date: 15 Gy
Reference Point Session Dosage Given: 2.5 Gy
Session Number: 6

## 2023-06-09 ENCOUNTER — Other Ambulatory Visit: Payer: Self-pay

## 2023-06-09 ENCOUNTER — Ambulatory Visit
Admission: RE | Admit: 2023-06-09 | Discharge: 2023-06-09 | Disposition: A | Discharge status: Home / Self Care | Payer: Medicare HMO | Source: Ambulatory Visit | Attending: Radiation Oncology | Admitting: Radiation Oncology

## 2023-06-09 DIAGNOSIS — Z510 Encounter for antineoplastic radiation therapy: Secondary | ICD-10-CM | POA: Diagnosis not present

## 2023-06-09 DIAGNOSIS — C61 Malignant neoplasm of prostate: Secondary | ICD-10-CM | POA: Diagnosis not present

## 2023-06-09 DIAGNOSIS — Z191 Hormone sensitive malignancy status: Secondary | ICD-10-CM | POA: Diagnosis not present

## 2023-06-09 LAB — RAD ONC ARIA SESSION SUMMARY
Course Elapsed Days: 8
Plan Fractions Treated to Date: 7
Plan Prescribed Dose Per Fraction: 2.5 Gy
Plan Total Fractions Prescribed: 28
Plan Total Prescribed Dose: 70 Gy
Reference Point Dosage Given to Date: 17.5 Gy
Reference Point Session Dosage Given: 2.5 Gy
Session Number: 7

## 2023-06-12 ENCOUNTER — Other Ambulatory Visit: Payer: Self-pay

## 2023-06-12 ENCOUNTER — Ambulatory Visit
Admission: RE | Admit: 2023-06-12 | Discharge: 2023-06-12 | Disposition: A | Discharge status: Home / Self Care | Payer: Medicare HMO | Source: Ambulatory Visit | Attending: Radiation Oncology | Admitting: Radiation Oncology

## 2023-06-12 DIAGNOSIS — Z191 Hormone sensitive malignancy status: Secondary | ICD-10-CM | POA: Diagnosis not present

## 2023-06-12 DIAGNOSIS — Z510 Encounter for antineoplastic radiation therapy: Secondary | ICD-10-CM | POA: Diagnosis not present

## 2023-06-12 DIAGNOSIS — C61 Malignant neoplasm of prostate: Secondary | ICD-10-CM | POA: Diagnosis not present

## 2023-06-12 DIAGNOSIS — R69 Illness, unspecified: Secondary | ICD-10-CM | POA: Diagnosis not present

## 2023-06-12 LAB — RAD ONC ARIA SESSION SUMMARY
Course Elapsed Days: 11
Plan Fractions Treated to Date: 8
Plan Prescribed Dose Per Fraction: 2.5 Gy
Plan Total Fractions Prescribed: 28
Plan Total Prescribed Dose: 70 Gy
Reference Point Dosage Given to Date: 20 Gy
Reference Point Session Dosage Given: 2.5 Gy
Session Number: 8

## 2023-06-13 ENCOUNTER — Other Ambulatory Visit: Payer: Self-pay

## 2023-06-13 ENCOUNTER — Ambulatory Visit
Admission: RE | Admit: 2023-06-13 | Discharge: 2023-06-13 | Disposition: A | Discharge status: Home / Self Care | Payer: Medicare HMO | Source: Ambulatory Visit | Attending: Radiation Oncology | Admitting: Radiation Oncology

## 2023-06-13 DIAGNOSIS — C61 Malignant neoplasm of prostate: Secondary | ICD-10-CM | POA: Diagnosis not present

## 2023-06-13 DIAGNOSIS — Z510 Encounter for antineoplastic radiation therapy: Secondary | ICD-10-CM | POA: Diagnosis not present

## 2023-06-13 DIAGNOSIS — Z191 Hormone sensitive malignancy status: Secondary | ICD-10-CM | POA: Diagnosis not present

## 2023-06-13 LAB — RAD ONC ARIA SESSION SUMMARY
Course Elapsed Days: 12
Plan Fractions Treated to Date: 9
Plan Prescribed Dose Per Fraction: 2.5 Gy
Plan Total Fractions Prescribed: 28
Plan Total Prescribed Dose: 70 Gy
Reference Point Dosage Given to Date: 22.5 Gy
Reference Point Session Dosage Given: 2.5 Gy
Session Number: 9

## 2023-06-14 ENCOUNTER — Other Ambulatory Visit: Payer: Self-pay

## 2023-06-14 ENCOUNTER — Ambulatory Visit
Admission: RE | Admit: 2023-06-14 | Discharge: 2023-06-14 | Disposition: A | Discharge status: Home / Self Care | Payer: Medicare HMO | Source: Ambulatory Visit | Attending: Radiation Oncology | Admitting: Radiation Oncology

## 2023-06-14 DIAGNOSIS — Z510 Encounter for antineoplastic radiation therapy: Secondary | ICD-10-CM | POA: Diagnosis not present

## 2023-06-14 DIAGNOSIS — C61 Malignant neoplasm of prostate: Secondary | ICD-10-CM | POA: Diagnosis not present

## 2023-06-14 DIAGNOSIS — Z191 Hormone sensitive malignancy status: Secondary | ICD-10-CM | POA: Diagnosis not present

## 2023-06-14 LAB — RAD ONC ARIA SESSION SUMMARY
Course Elapsed Days: 13
Plan Fractions Treated to Date: 10
Plan Prescribed Dose Per Fraction: 2.5 Gy
Plan Total Fractions Prescribed: 28
Plan Total Prescribed Dose: 70 Gy
Reference Point Dosage Given to Date: 25 Gy
Reference Point Session Dosage Given: 2.5 Gy
Session Number: 10

## 2023-06-15 ENCOUNTER — Other Ambulatory Visit: Payer: Self-pay

## 2023-06-15 ENCOUNTER — Ambulatory Visit
Admission: RE | Admit: 2023-06-15 | Discharge: 2023-06-15 | Disposition: A | Discharge status: Home / Self Care | Payer: Medicare HMO | Source: Ambulatory Visit | Attending: Radiation Oncology | Admitting: Radiation Oncology

## 2023-06-15 DIAGNOSIS — Z510 Encounter for antineoplastic radiation therapy: Secondary | ICD-10-CM | POA: Diagnosis not present

## 2023-06-15 DIAGNOSIS — C61 Malignant neoplasm of prostate: Secondary | ICD-10-CM | POA: Diagnosis not present

## 2023-06-15 DIAGNOSIS — Z191 Hormone sensitive malignancy status: Secondary | ICD-10-CM | POA: Diagnosis not present

## 2023-06-15 LAB — RAD ONC ARIA SESSION SUMMARY
Course Elapsed Days: 14
Plan Fractions Treated to Date: 11
Plan Prescribed Dose Per Fraction: 2.5 Gy
Plan Total Fractions Prescribed: 28
Plan Total Prescribed Dose: 70 Gy
Reference Point Dosage Given to Date: 27.5 Gy
Reference Point Session Dosage Given: 2.5 Gy
Session Number: 11

## 2023-06-16 ENCOUNTER — Other Ambulatory Visit: Payer: Self-pay

## 2023-06-16 ENCOUNTER — Ambulatory Visit
Admission: RE | Admit: 2023-06-16 | Discharge: 2023-06-16 | Disposition: A | Discharge status: Home / Self Care | Payer: Medicare HMO | Source: Ambulatory Visit | Attending: Radiation Oncology | Admitting: Radiation Oncology

## 2023-06-16 ENCOUNTER — Ambulatory Visit: Payer: Medicare HMO

## 2023-06-16 DIAGNOSIS — Z510 Encounter for antineoplastic radiation therapy: Secondary | ICD-10-CM | POA: Diagnosis not present

## 2023-06-16 DIAGNOSIS — C61 Malignant neoplasm of prostate: Secondary | ICD-10-CM | POA: Diagnosis not present

## 2023-06-16 DIAGNOSIS — Z191 Hormone sensitive malignancy status: Secondary | ICD-10-CM | POA: Diagnosis not present

## 2023-06-16 LAB — RAD ONC ARIA SESSION SUMMARY
Course Elapsed Days: 15
Plan Fractions Treated to Date: 12
Plan Prescribed Dose Per Fraction: 2.5 Gy
Plan Total Fractions Prescribed: 28
Plan Total Prescribed Dose: 70 Gy
Reference Point Dosage Given to Date: 30 Gy
Reference Point Session Dosage Given: 2.5 Gy
Session Number: 12

## 2023-06-19 ENCOUNTER — Ambulatory Visit
Admission: RE | Admit: 2023-06-19 | Discharge: 2023-06-19 | Disposition: A | Discharge status: Home / Self Care | Payer: Medicare HMO | Source: Ambulatory Visit | Attending: Radiation Oncology | Admitting: Radiation Oncology

## 2023-06-19 ENCOUNTER — Other Ambulatory Visit: Payer: Self-pay

## 2023-06-19 DIAGNOSIS — Z510 Encounter for antineoplastic radiation therapy: Secondary | ICD-10-CM | POA: Diagnosis not present

## 2023-06-19 DIAGNOSIS — C61 Malignant neoplasm of prostate: Secondary | ICD-10-CM | POA: Diagnosis not present

## 2023-06-19 DIAGNOSIS — Z191 Hormone sensitive malignancy status: Secondary | ICD-10-CM | POA: Diagnosis not present

## 2023-06-19 LAB — RAD ONC ARIA SESSION SUMMARY
Course Elapsed Days: 18
Plan Fractions Treated to Date: 13
Plan Prescribed Dose Per Fraction: 2.5 Gy
Plan Total Fractions Prescribed: 28
Plan Total Prescribed Dose: 70 Gy
Reference Point Dosage Given to Date: 32.5 Gy
Reference Point Session Dosage Given: 2.5 Gy
Session Number: 13

## 2023-06-20 ENCOUNTER — Other Ambulatory Visit: Payer: Self-pay

## 2023-06-20 ENCOUNTER — Ambulatory Visit
Admission: RE | Admit: 2023-06-20 | Discharge: 2023-06-20 | Disposition: A | Discharge status: Home / Self Care | Payer: Medicare HMO | Source: Ambulatory Visit | Attending: Radiation Oncology | Admitting: Radiation Oncology

## 2023-06-20 DIAGNOSIS — Z191 Hormone sensitive malignancy status: Secondary | ICD-10-CM | POA: Diagnosis not present

## 2023-06-20 DIAGNOSIS — C61 Malignant neoplasm of prostate: Secondary | ICD-10-CM | POA: Diagnosis not present

## 2023-06-20 DIAGNOSIS — Z510 Encounter for antineoplastic radiation therapy: Secondary | ICD-10-CM | POA: Diagnosis not present

## 2023-06-20 LAB — RAD ONC ARIA SESSION SUMMARY
Course Elapsed Days: 19
Plan Fractions Treated to Date: 14
Plan Prescribed Dose Per Fraction: 2.5 Gy
Plan Total Fractions Prescribed: 28
Plan Total Prescribed Dose: 70 Gy
Reference Point Dosage Given to Date: 35 Gy
Reference Point Session Dosage Given: 2.5 Gy
Session Number: 14

## 2023-06-21 ENCOUNTER — Ambulatory Visit
Admission: RE | Admit: 2023-06-21 | Discharge: 2023-06-21 | Disposition: A | Discharge status: Home / Self Care | Payer: Medicare HMO | Source: Ambulatory Visit | Attending: Radiation Oncology | Admitting: Radiation Oncology

## 2023-06-21 ENCOUNTER — Other Ambulatory Visit: Payer: Self-pay

## 2023-06-21 DIAGNOSIS — Z191 Hormone sensitive malignancy status: Secondary | ICD-10-CM | POA: Diagnosis not present

## 2023-06-21 DIAGNOSIS — Z510 Encounter for antineoplastic radiation therapy: Secondary | ICD-10-CM | POA: Diagnosis not present

## 2023-06-21 DIAGNOSIS — C61 Malignant neoplasm of prostate: Secondary | ICD-10-CM | POA: Diagnosis not present

## 2023-06-21 LAB — RAD ONC ARIA SESSION SUMMARY
Course Elapsed Days: 20
Plan Fractions Treated to Date: 15
Plan Prescribed Dose Per Fraction: 2.5 Gy
Plan Total Fractions Prescribed: 28
Plan Total Prescribed Dose: 70 Gy
Reference Point Dosage Given to Date: 37.5 Gy
Reference Point Session Dosage Given: 2.5 Gy
Session Number: 15

## 2023-06-22 ENCOUNTER — Other Ambulatory Visit: Payer: Self-pay

## 2023-06-22 ENCOUNTER — Ambulatory Visit
Admission: RE | Admit: 2023-06-22 | Discharge: 2023-06-22 | Disposition: A | Discharge status: Home / Self Care | Payer: Medicare HMO | Source: Ambulatory Visit | Attending: Radiation Oncology | Admitting: Radiation Oncology

## 2023-06-22 DIAGNOSIS — C61 Malignant neoplasm of prostate: Secondary | ICD-10-CM | POA: Diagnosis not present

## 2023-06-22 DIAGNOSIS — Z191 Hormone sensitive malignancy status: Secondary | ICD-10-CM | POA: Diagnosis not present

## 2023-06-22 DIAGNOSIS — Z510 Encounter for antineoplastic radiation therapy: Secondary | ICD-10-CM | POA: Diagnosis not present

## 2023-06-22 LAB — RAD ONC ARIA SESSION SUMMARY
Course Elapsed Days: 21
Plan Fractions Treated to Date: 16
Plan Prescribed Dose Per Fraction: 2.5 Gy
Plan Total Fractions Prescribed: 28
Plan Total Prescribed Dose: 70 Gy
Reference Point Dosage Given to Date: 40 Gy
Reference Point Session Dosage Given: 2.5 Gy
Session Number: 16

## 2023-06-23 ENCOUNTER — Other Ambulatory Visit: Payer: Self-pay

## 2023-06-23 ENCOUNTER — Ambulatory Visit
Admission: RE | Admit: 2023-06-23 | Discharge: 2023-06-23 | Disposition: A | Discharge status: Home / Self Care | Payer: Medicare HMO | Source: Ambulatory Visit | Attending: Radiation Oncology | Admitting: Radiation Oncology

## 2023-06-23 DIAGNOSIS — Z191 Hormone sensitive malignancy status: Secondary | ICD-10-CM | POA: Diagnosis not present

## 2023-06-23 DIAGNOSIS — Z510 Encounter for antineoplastic radiation therapy: Secondary | ICD-10-CM | POA: Diagnosis not present

## 2023-06-23 DIAGNOSIS — C61 Malignant neoplasm of prostate: Secondary | ICD-10-CM | POA: Diagnosis not present

## 2023-06-23 LAB — RAD ONC ARIA SESSION SUMMARY
Course Elapsed Days: 22
Plan Fractions Treated to Date: 17
Plan Prescribed Dose Per Fraction: 2.5 Gy
Plan Total Fractions Prescribed: 28
Plan Total Prescribed Dose: 70 Gy
Reference Point Dosage Given to Date: 42.5 Gy
Reference Point Session Dosage Given: 2.5 Gy
Session Number: 17

## 2023-06-26 ENCOUNTER — Ambulatory Visit
Admission: RE | Admit: 2023-06-26 | Discharge: 2023-06-26 | Disposition: A | Discharge status: Home / Self Care | Payer: Medicare HMO | Source: Ambulatory Visit | Attending: Radiation Oncology | Admitting: Radiation Oncology

## 2023-06-26 ENCOUNTER — Other Ambulatory Visit: Payer: Self-pay

## 2023-06-26 DIAGNOSIS — Z510 Encounter for antineoplastic radiation therapy: Secondary | ICD-10-CM | POA: Insufficient documentation

## 2023-06-26 DIAGNOSIS — C61 Malignant neoplasm of prostate: Secondary | ICD-10-CM | POA: Diagnosis not present

## 2023-06-26 DIAGNOSIS — Z191 Hormone sensitive malignancy status: Secondary | ICD-10-CM | POA: Diagnosis not present

## 2023-06-26 LAB — RAD ONC ARIA SESSION SUMMARY
Course Elapsed Days: 25
Plan Fractions Treated to Date: 18
Plan Prescribed Dose Per Fraction: 2.5 Gy
Plan Total Fractions Prescribed: 28
Plan Total Prescribed Dose: 70 Gy
Reference Point Dosage Given to Date: 45 Gy
Reference Point Session Dosage Given: 2.5 Gy
Session Number: 18

## 2023-06-27 ENCOUNTER — Other Ambulatory Visit: Payer: Self-pay

## 2023-06-27 ENCOUNTER — Ambulatory Visit
Admission: RE | Admit: 2023-06-27 | Discharge: 2023-06-27 | Disposition: A | Discharge status: Home / Self Care | Payer: Medicare HMO | Source: Ambulatory Visit | Attending: Radiation Oncology | Admitting: Radiation Oncology

## 2023-06-27 DIAGNOSIS — Z510 Encounter for antineoplastic radiation therapy: Secondary | ICD-10-CM | POA: Diagnosis not present

## 2023-06-27 DIAGNOSIS — C61 Malignant neoplasm of prostate: Secondary | ICD-10-CM | POA: Diagnosis not present

## 2023-06-27 DIAGNOSIS — Z191 Hormone sensitive malignancy status: Secondary | ICD-10-CM | POA: Diagnosis not present

## 2023-06-27 LAB — RAD ONC ARIA SESSION SUMMARY
Course Elapsed Days: 26
Plan Fractions Treated to Date: 19
Plan Prescribed Dose Per Fraction: 2.5 Gy
Plan Total Fractions Prescribed: 28
Plan Total Prescribed Dose: 70 Gy
Reference Point Dosage Given to Date: 47.5 Gy
Reference Point Session Dosage Given: 2.5 Gy
Session Number: 19

## 2023-06-28 ENCOUNTER — Ambulatory Visit
Admission: RE | Admit: 2023-06-28 | Discharge: 2023-06-28 | Disposition: A | Discharge status: Home / Self Care | Payer: Medicare HMO | Source: Ambulatory Visit | Attending: Radiation Oncology | Admitting: Radiation Oncology

## 2023-06-28 ENCOUNTER — Other Ambulatory Visit: Payer: Self-pay

## 2023-06-28 DIAGNOSIS — Z510 Encounter for antineoplastic radiation therapy: Secondary | ICD-10-CM | POA: Diagnosis not present

## 2023-06-28 DIAGNOSIS — C61 Malignant neoplasm of prostate: Secondary | ICD-10-CM | POA: Diagnosis not present

## 2023-06-28 DIAGNOSIS — Z191 Hormone sensitive malignancy status: Secondary | ICD-10-CM | POA: Diagnosis not present

## 2023-06-28 LAB — RAD ONC ARIA SESSION SUMMARY
Course Elapsed Days: 27
Plan Fractions Treated to Date: 20
Plan Prescribed Dose Per Fraction: 2.5 Gy
Plan Total Fractions Prescribed: 28
Plan Total Prescribed Dose: 70 Gy
Reference Point Dosage Given to Date: 50 Gy
Reference Point Session Dosage Given: 2.5 Gy
Session Number: 20

## 2023-06-30 ENCOUNTER — Other Ambulatory Visit: Payer: Self-pay

## 2023-06-30 ENCOUNTER — Ambulatory Visit
Admission: RE | Admit: 2023-06-30 | Discharge: 2023-06-30 | Disposition: A | Discharge status: Home / Self Care | Payer: Medicare HMO | Source: Ambulatory Visit | Attending: Radiation Oncology | Admitting: Radiation Oncology

## 2023-06-30 DIAGNOSIS — Z191 Hormone sensitive malignancy status: Secondary | ICD-10-CM | POA: Diagnosis not present

## 2023-06-30 DIAGNOSIS — Z510 Encounter for antineoplastic radiation therapy: Secondary | ICD-10-CM | POA: Diagnosis not present

## 2023-06-30 DIAGNOSIS — C61 Malignant neoplasm of prostate: Secondary | ICD-10-CM | POA: Diagnosis not present

## 2023-06-30 LAB — RAD ONC ARIA SESSION SUMMARY
Course Elapsed Days: 29
Plan Fractions Treated to Date: 21
Plan Prescribed Dose Per Fraction: 2.5 Gy
Plan Total Fractions Prescribed: 28
Plan Total Prescribed Dose: 70 Gy
Reference Point Dosage Given to Date: 52.5 Gy
Reference Point Session Dosage Given: 2.5 Gy
Session Number: 21

## 2023-07-03 ENCOUNTER — Ambulatory Visit
Admission: RE | Admit: 2023-07-03 | Discharge: 2023-07-03 | Disposition: A | Discharge status: Home / Self Care | Payer: Medicare HMO | Source: Ambulatory Visit | Attending: Radiation Oncology | Admitting: Radiation Oncology

## 2023-07-03 ENCOUNTER — Other Ambulatory Visit: Payer: Self-pay

## 2023-07-03 DIAGNOSIS — Z191 Hormone sensitive malignancy status: Secondary | ICD-10-CM | POA: Diagnosis not present

## 2023-07-03 DIAGNOSIS — Z510 Encounter for antineoplastic radiation therapy: Secondary | ICD-10-CM | POA: Diagnosis not present

## 2023-07-03 DIAGNOSIS — C61 Malignant neoplasm of prostate: Secondary | ICD-10-CM | POA: Diagnosis not present

## 2023-07-03 LAB — RAD ONC ARIA SESSION SUMMARY
Course Elapsed Days: 32
Plan Fractions Treated to Date: 22
Plan Prescribed Dose Per Fraction: 2.5 Gy
Plan Total Fractions Prescribed: 28
Plan Total Prescribed Dose: 70 Gy
Reference Point Dosage Given to Date: 55 Gy
Reference Point Session Dosage Given: 2.5 Gy
Session Number: 22

## 2023-07-04 ENCOUNTER — Ambulatory Visit
Admission: RE | Admit: 2023-07-04 | Discharge: 2023-07-04 | Disposition: A | Discharge status: Home / Self Care | Payer: Medicare HMO | Source: Ambulatory Visit | Attending: Radiation Oncology | Admitting: Radiation Oncology

## 2023-07-04 ENCOUNTER — Other Ambulatory Visit: Payer: Self-pay

## 2023-07-04 DIAGNOSIS — C61 Malignant neoplasm of prostate: Secondary | ICD-10-CM | POA: Diagnosis not present

## 2023-07-04 DIAGNOSIS — M25531 Pain in right wrist: Secondary | ICD-10-CM | POA: Diagnosis not present

## 2023-07-04 DIAGNOSIS — M25532 Pain in left wrist: Secondary | ICD-10-CM | POA: Diagnosis not present

## 2023-07-04 DIAGNOSIS — Z510 Encounter for antineoplastic radiation therapy: Secondary | ICD-10-CM | POA: Diagnosis not present

## 2023-07-04 DIAGNOSIS — Z191 Hormone sensitive malignancy status: Secondary | ICD-10-CM | POA: Diagnosis not present

## 2023-07-04 DIAGNOSIS — W19XXXA Unspecified fall, initial encounter: Secondary | ICD-10-CM | POA: Diagnosis not present

## 2023-07-04 LAB — RAD ONC ARIA SESSION SUMMARY
Course Elapsed Days: 33
Plan Fractions Treated to Date: 23
Plan Prescribed Dose Per Fraction: 2.5 Gy
Plan Total Fractions Prescribed: 28
Plan Total Prescribed Dose: 70 Gy
Reference Point Dosage Given to Date: 57.5 Gy
Reference Point Session Dosage Given: 2.5 Gy
Session Number: 23

## 2023-07-04 NOTE — Progress Notes (Signed)
Radiation therapist Luther Parody, reported that Mr. David Vargas had fall outside by the gardens and sent Mr. Buboltz over to nursing after radiation treatment.  Mr. Gieselman reports foot was caught on some pebbles/rocks causing him to lose his footing.  Mr. Heslin was seen nurse observed several small skin tears to left elbow, posterior forearm, and a quarter sized abrasion to left knee.  Mr. Savidge denies hitting head reports his left wrist a little painful when rotating it.  Reports used his left hand to try to brace for the fall.  No swelling observed.  All areas were cleaned and dressed with Band-Aid's and triple antibiotic ointment.  Denies any pain when walking at this time.  Mr. Turnage didn't want to see doctor at this time.  RN advised Mr. Bellanca if wrist starts to swell to put ice pack on it and to follow up with his PCP.  Mr. Lovering states he will call his PCP to inform him of the fall.  Mr. Read was very appreciative of the care nurse provided in his time of need.  Will inform Ashlyn Bruning, PA-C and Dr. Kathrynn Running.

## 2023-07-05 ENCOUNTER — Other Ambulatory Visit: Payer: Self-pay

## 2023-07-05 ENCOUNTER — Ambulatory Visit
Admission: RE | Admit: 2023-07-05 | Discharge: 2023-07-05 | Disposition: A | Discharge status: Home / Self Care | Payer: Medicare HMO | Source: Ambulatory Visit | Attending: Radiation Oncology | Admitting: Radiation Oncology

## 2023-07-05 DIAGNOSIS — C61 Malignant neoplasm of prostate: Secondary | ICD-10-CM | POA: Diagnosis not present

## 2023-07-05 DIAGNOSIS — Z510 Encounter for antineoplastic radiation therapy: Secondary | ICD-10-CM | POA: Diagnosis not present

## 2023-07-05 DIAGNOSIS — Z191 Hormone sensitive malignancy status: Secondary | ICD-10-CM | POA: Diagnosis not present

## 2023-07-05 LAB — RAD ONC ARIA SESSION SUMMARY
Course Elapsed Days: 34
Plan Fractions Treated to Date: 24
Plan Prescribed Dose Per Fraction: 2.5 Gy
Plan Total Fractions Prescribed: 28
Plan Total Prescribed Dose: 70 Gy
Reference Point Dosage Given to Date: 60 Gy
Reference Point Session Dosage Given: 2.5 Gy
Session Number: 24

## 2023-07-06 ENCOUNTER — Ambulatory Visit
Admission: RE | Admit: 2023-07-06 | Discharge: 2023-07-06 | Disposition: A | Discharge status: Home / Self Care | Payer: Medicare HMO | Source: Ambulatory Visit | Attending: Radiation Oncology | Admitting: Radiation Oncology

## 2023-07-06 ENCOUNTER — Other Ambulatory Visit: Payer: Self-pay

## 2023-07-06 DIAGNOSIS — Z510 Encounter for antineoplastic radiation therapy: Secondary | ICD-10-CM | POA: Diagnosis not present

## 2023-07-06 DIAGNOSIS — Z191 Hormone sensitive malignancy status: Secondary | ICD-10-CM | POA: Diagnosis not present

## 2023-07-06 DIAGNOSIS — C61 Malignant neoplasm of prostate: Secondary | ICD-10-CM | POA: Diagnosis not present

## 2023-07-06 LAB — RAD ONC ARIA SESSION SUMMARY
Course Elapsed Days: 35
Plan Fractions Treated to Date: 25
Plan Prescribed Dose Per Fraction: 2.5 Gy
Plan Total Fractions Prescribed: 28
Plan Total Prescribed Dose: 70 Gy
Reference Point Dosage Given to Date: 62.5 Gy
Reference Point Session Dosage Given: 2.5 Gy
Session Number: 25

## 2023-07-07 ENCOUNTER — Ambulatory Visit: Admission: RE | Admit: 2023-07-07 | Payer: Medicare HMO | Source: Ambulatory Visit

## 2023-07-07 ENCOUNTER — Other Ambulatory Visit: Payer: Self-pay

## 2023-07-07 DIAGNOSIS — Z510 Encounter for antineoplastic radiation therapy: Secondary | ICD-10-CM | POA: Diagnosis not present

## 2023-07-07 DIAGNOSIS — Z191 Hormone sensitive malignancy status: Secondary | ICD-10-CM | POA: Diagnosis not present

## 2023-07-07 DIAGNOSIS — C61 Malignant neoplasm of prostate: Secondary | ICD-10-CM | POA: Diagnosis not present

## 2023-07-07 LAB — RAD ONC ARIA SESSION SUMMARY
Course Elapsed Days: 36
Plan Fractions Treated to Date: 26
Plan Prescribed Dose Per Fraction: 2.5 Gy
Plan Total Fractions Prescribed: 28
Plan Total Prescribed Dose: 70 Gy
Reference Point Dosage Given to Date: 65 Gy
Reference Point Session Dosage Given: 2.5 Gy
Session Number: 26

## 2023-07-10 ENCOUNTER — Other Ambulatory Visit: Payer: Self-pay

## 2023-07-10 ENCOUNTER — Ambulatory Visit: Admission: RE | Admit: 2023-07-10 | Payer: Medicare HMO | Source: Ambulatory Visit

## 2023-07-10 DIAGNOSIS — Z510 Encounter for antineoplastic radiation therapy: Secondary | ICD-10-CM | POA: Diagnosis not present

## 2023-07-10 DIAGNOSIS — Z191 Hormone sensitive malignancy status: Secondary | ICD-10-CM | POA: Diagnosis not present

## 2023-07-10 DIAGNOSIS — C61 Malignant neoplasm of prostate: Secondary | ICD-10-CM | POA: Diagnosis not present

## 2023-07-10 DIAGNOSIS — S62115A Nondisplaced fracture of triquetrum [cuneiform] bone, left wrist, initial encounter for closed fracture: Secondary | ICD-10-CM | POA: Diagnosis not present

## 2023-07-10 LAB — RAD ONC ARIA SESSION SUMMARY
Course Elapsed Days: 39
Plan Fractions Treated to Date: 27
Plan Prescribed Dose Per Fraction: 2.5 Gy
Plan Total Fractions Prescribed: 28
Plan Total Prescribed Dose: 70 Gy
Reference Point Dosage Given to Date: 67.5 Gy
Reference Point Session Dosage Given: 2.5 Gy
Session Number: 27

## 2023-07-11 ENCOUNTER — Ambulatory Visit
Admission: RE | Admit: 2023-07-11 | Discharge: 2023-07-11 | Disposition: A | Discharge status: Home / Self Care | Payer: Medicare HMO | Source: Ambulatory Visit | Attending: Radiation Oncology | Admitting: Radiation Oncology

## 2023-07-11 ENCOUNTER — Ambulatory Visit: Payer: Medicare HMO

## 2023-07-11 ENCOUNTER — Other Ambulatory Visit: Payer: Self-pay

## 2023-07-11 DIAGNOSIS — Z191 Hormone sensitive malignancy status: Secondary | ICD-10-CM | POA: Diagnosis not present

## 2023-07-11 DIAGNOSIS — C61 Malignant neoplasm of prostate: Secondary | ICD-10-CM

## 2023-07-11 DIAGNOSIS — Z510 Encounter for antineoplastic radiation therapy: Secondary | ICD-10-CM | POA: Diagnosis not present

## 2023-07-11 LAB — RAD ONC ARIA SESSION SUMMARY
Course Elapsed Days: 40
Plan Fractions Treated to Date: 28
Plan Prescribed Dose Per Fraction: 2.5 Gy
Plan Total Fractions Prescribed: 28
Plan Total Prescribed Dose: 70 Gy
Reference Point Dosage Given to Date: 70 Gy
Reference Point Session Dosage Given: 2.5 Gy
Session Number: 28

## 2023-07-12 NOTE — Radiation Completion Notes (Addendum)
Radiation Oncology         ([phone]) [phone] ________________________________  Name: David Vargas MRN: [id number]  Date: 07/11/2023  DOB: October 06, 1951  Referring Physician: Bjorn Pippin, M.D. Date of Service: 2023-07-12 Radiation Oncologist: Margaretmary Bayley, M.D. [facility]     RADIATION ONCOLOGY END OF TREATMENT NOTE     Diagnosis: 72 y.o. gentleman with Stage T1c adenocarcinoma of the prostate with Gleason score of 4+3, and PSA of 7.82.   Intent: Curative     ==========DELIVERED PLANS==========  First Treatment Date: 2023-06-01 - Last Treatment Date: 2023-07-11   Plan Name: Prostate Site: Prostate Technique: IMRT Mode: Photon Dose Per Fraction: 2.5 Gy Prescribed Dose (Delivered / Prescribed): 70 Gy / 70 Gy Prescribed Fxs (Delivered / Prescribed): 28 / 28     ==========ON TREATMENT VISIT DATES========== 2023-06-02, 2023-06-08, 2023-06-16, 2023-06-23, 2023-06-30, 2023-07-07    See weekly On Treatment Notes in Epic for details. He tolerated treatments well with only mild urinary symptoms and modest fatigue.  The patient will receive a call in about one month from the radiation oncology department. He will continue follow up with Dr. Annabell Howells as well.  ------------------------------------------------   Margaretmary Dys, MD [facility]  Radiation Oncology Direct Dial: [phone]  Fax: [phone] [email or web address]  Skype  LinkedIn

## 2023-07-17 DIAGNOSIS — R69 Illness, unspecified: Secondary | ICD-10-CM | POA: Diagnosis not present

## 2023-07-20 DIAGNOSIS — R5381 Other malaise: Secondary | ICD-10-CM | POA: Diagnosis not present

## 2023-07-20 DIAGNOSIS — Z743 Need for continuous supervision: Secondary | ICD-10-CM | POA: Diagnosis not present

## 2023-07-21 NOTE — Progress Notes (Signed)
Patient was a RadOnc Consult on 05/09/23 for his stage T1c adenocarcinoma of the prostate with Gleason score of 4+3, and PSA of 7.82.  Patient proceed with treatment recommendations of 5.5 weeks of external beam therapy without ADT and had his final radiation treatment on 07/11/23.   Patient is scheduled for a post treatment nurse call on 08/22/23 and has his first post treatment PSA on 10/16/23 @ 11:45 am at [facility], followed by follow up with Dr. Annabell Howells 10/23/23 at 12:45.    RN spoke with patient and provided education on post treatment PSA monitoring, and notified him of urology follow up appointments.  No additional needs at this time.

## 2023-07-31 DIAGNOSIS — Z6827 Body mass index (BMI) 27.0-27.9, adult: Secondary | ICD-10-CM | POA: Diagnosis not present

## 2023-07-31 DIAGNOSIS — R040 Epistaxis: Secondary | ICD-10-CM | POA: Diagnosis not present

## 2023-07-31 DIAGNOSIS — J309 Allergic rhinitis, unspecified: Secondary | ICD-10-CM | POA: Diagnosis not present

## 2023-08-01 DIAGNOSIS — S62115A Nondisplaced fracture of triquetrum [cuneiform] bone, left wrist, initial encounter for closed fracture: Secondary | ICD-10-CM | POA: Diagnosis not present

## 2023-08-22 ENCOUNTER — Ambulatory Visit
Admission: RE | Admit: 2023-08-22 | Discharge: 2023-08-22 | Disposition: A | Discharge status: Home / Self Care | Payer: Medicare HMO | Source: Ambulatory Visit | Attending: Internal Medicine | Admitting: Internal Medicine

## 2023-08-22 DIAGNOSIS — S62115A Nondisplaced fracture of triquetrum [cuneiform] bone, left wrist, initial encounter for closed fracture: Secondary | ICD-10-CM | POA: Diagnosis not present

## 2023-08-22 NOTE — Progress Notes (Signed)
  Radiation Oncology         [phone]) [phone] ________________________________  Name: David Vargas MRN: [id number]  Date of Service: 08/22/2023  DOB: 11-20-1951  Post Treatment Telephone Note  Diagnosis:  C61 Malignant neoplasm of prostate (as documented in provider EOT note)   Pre Treatment IPSS Score: 11 (as documented in the provider consult note)   The patient was available for call today.   Symptoms of fatigue have improved mildly since completing therapy.  Symptoms of bladder changes have improved since completing therapy. Current symptoms include none, and medications for bladder symptoms include Tamsulosin (PRN).  Symptoms of bowel changes have improved since completing therapy. Current symptoms include none, and medications for bowel symptoms include none.     Post Treatment IPSS Score: IPSS Questionnaire (AUA-7): Over the past month.   1)  How often have you had a sensation of not emptying your bladder completely after you finish urinating?  0 - Not at all  2)  How often have you had to urinate again less than two hours after you finished urinating? 1 - Less than 1 time in 5  3)  How often have you found you stopped and started again several times when you urinated?  0 - Not at all  4) How difficult have you found it to postpone urination?  0 - Not at all  5) How often have you had a weak urinary stream?  1 - Less than 1 time in 5  6) How often have you had to push or strain to begin urination?  3 - About half the time  7) How many times did you most typically get up to urinate from the time you went to bed until the time you got up in the morning?  1 - 1 time  Total score:  6. Which indicates mild symptoms  0-7 mildly symptomatic   8-19 moderately symptomatic   20-35 severely symptomatic    Patient has a scheduled follow up visit with his urologist, Dr. Annabell Howells, on 10/23/2023 for ongoing surveillance. He was counseled that PSA levels will be drawn in the urology  office, and was reassured that additional time is expected to improve bowel and bladder symptoms. He was encouraged to call back with concerns or questions regarding radiation.  This concludes the interaction.  Ruel Favors, LPN

## 2023-09-19 ENCOUNTER — Encounter: Payer: Self-pay | Admitting: Cardiology

## 2023-09-19 DIAGNOSIS — R931 Abnormal findings on diagnostic imaging of heart and coronary circulation: Secondary | ICD-10-CM | POA: Insufficient documentation

## 2023-09-19 NOTE — Progress Notes (Unsigned)
Cardiology Office Note:    Date:  09/20/2023   ID:  Geoffery Lyons, DOB Dec 21, 1951, MRN [id number]  PCP:  [facility] [place]  Cardiologist:  Armanda Magic, MD    Referring MD: [facility] Petit*   Chief Complaint  Patient presents with   Atrial Fibrillation   Hyperlipidemia   Coronary Artery Disease    History of Present Illness:    DAVIAN AGREDANO is a 72 y.o. male with a hx of PAF that occurred once with a kidney stone.  He also has hypertension, hyperlipidemia and mildly dilated aortic root.  Last echo in 2015 was normal LVEF 60-65% with trivial TR and normal aortic root.  Cardiac catheterization in 2005 for abnormal nuclear study showed no significant CAD echo probable intramyocardial mid LAD segment with minimal systolic depression.He  He had a prostate bx complicated by urosepsis and then 3 days later developed coffee ground emesis with weakness and dizziness and found to be in afib with RVR and hypotension and Hbg dropped from 14 to 9 and got a transfusion. Cardiology was consulted for Afib and underwent DCCV x 3 in ER. It was felt he had a Mallory Weiss tear and had 2 endo clips placed and also had a duodenal ulcer.  He was started on IV Amio and discharged home on PO Amio 200mg  daily for 21 days and then stopped it.  He was not anticoagulated due to [facility] score of 1.    Coronary artery Ca score was elevated at 105 in 2021.  Statin intolerant and on Zetia.  He is here today for followup and is doing well.  He denies any chest pain or pressure, SOB, DOE, PND, orthopnea, LE edema, dizziness, palpitations or syncope. He is compliant with his meds and is tolerating meds with no SE.      Past Medical History:  Diagnosis Date   Agatston coronary artery calcium score between 100 and 199    Ca score 105 on CT 07/2020   Arthritis    Basal cell carcinoma    followed by dermatology   Cancer [facility])    melanoma - left shoulder 8 to 9 yrs ago   Complication  of anesthesia    slow to wake up   GERD (gastroesophageal reflux disease)    H/O colonoscopy    2009, polypoid colorectal mucosa found, Dr. Randa Evens advised repeat in 3 years   Hiatal hernia    upper GI in 2003   History of blood transfusion    7 units given summer 2021 for wallory-weiss tear   History of hypertension 06/22/2018   no htn in last 2 years per pt on 06-22-2021 after 60 pound weight loss   History of kidney stones    History of nuclear stress test 2005   abnormal, cardiac cath done    Left inguinal hernia 06/22/2021   PAF (paroxysmal atrial fibrillation) (HCC)    he has had 2 episodes in the past one associated with kidney stone and the other urosepsis.  CHA2DS2/VASC score 1 (age > 21 >>he has remote hx of HTN but this resolved after weight loss) therefore will not anticoagulate    Past Surgical History:  Procedure Laterality Date   BIOPSY PROSTATE  08/03/2020   CARDIAC CATHETERIZATION  2005   no evidence of significamt atherosclerosis   ESOPHAGOGASTRODUODENOSCOPY (EGD) WITH PROPOFOL N/A 08/11/2020   Procedure: ESOPHAGOGASTRODUODENOSCOPY (EGD) WITH PROPOFOL;  Surgeon: Kerin Salen, MD;  Location: [facility] ENDOSCOPY;  Service: Gastroenterology;  Laterality: N/A;   ESOPHAGOGASTRODUODENOSCOPY (EGD) WITH PROPOFOL N/A 08/15/2020   Procedure: ESOPHAGOGASTRODUODENOSCOPY (EGD) WITH PROPOFOL;  Surgeon: Bernette Redbird, MD;  Location: [facility] ENDOSCOPY;  Service: Endoscopy;  Laterality: N/A;   EXTRACORPOREAL SHOCK WAVE LITHOTRIPSY Right 02/17/2020   Procedure: EXTRACORPOREAL SHOCK WAVE LITHOTRIPSY (ESWL);  Surgeon: Bjorn Pippin, MD;  Location: [facility];  Service: Urology;  Laterality: Right;   HEMOSTASIS CLIP PLACEMENT  08/11/2020   Procedure: HEMOSTASIS CLIP PLACEMENT;  Surgeon: Kerin Salen, MD;  Location: [facility] ENDOSCOPY;  Service: Gastroenterology;;   HEMOSTASIS CONTROL  08/11/2020   Procedure: HEMOSTASIS CONTROL;  Surgeon: Kerin Salen, MD;  Location: [facility] ENDOSCOPY;  Service:  Gastroenterology;;   HERNIA REPAIR  1990   right groin   INGUINAL HERNIA REPAIR Left 06/24/2021   Procedure: LAPAROSCOPIC LEFT INGUINAL HERNIA REPAIR WITH MESH;  Surgeon: Stechschulte, Hyman Hopes, MD;  Location: [facility];  Service: General;  Laterality: Left;   KNEE SURGERY     left 1984, right 2010  Arthroscopic   SHOULDER SURGERY Right    rotator cuff repair   TOTAL KNEE ARTHROPLASTY Left 11/28/2017   Procedure: TOTAL KNEE ARTHROPLASTY;  Surgeon: Sheral Apley, MD;  Location: [facility] OR;  Service: Orthopedics;  Laterality: Left;    Current Medications: Current Meds  Medication Sig   acetaminophen (TYLENOL) 325 MG tablet Take 650 mg by mouth every 6 (six) hours as needed for mild pain or fever.   Coenzyme Q10 (COQ10 PO) Take by mouth daily.   COMIRNATY syringe    ezetimibe (ZETIA) 10 MG tablet Take 1 tablet (10 mg total) by mouth daily.   famotidine (PEPCID) 20 MG tablet Take 20 mg by mouth as needed.   fluticasone (FLONASE) 50 MCG/ACT nasal spray Place into both nostrils.   tadalafil (CIALIS) 10 MG tablet Take 5 mg by mouth daily as needed for erectile dysfunction.    tamsulosin (FLOMAX) 0.4 MG CAPS capsule Take 0.4 mg by mouth as needed. For kidney stones     Allergies:   Atorvastatin and Simvastatin   Social History   Socioeconomic History   Marital status: Married    Spouse name: Not on file   Number of children: Not on file   Years of education: Not on file   Highest education level: Not on file  Occupational History   Not on file  Tobacco Use   Smoking status: Never   Smokeless tobacco: Never  Vaping Use   Vaping status: Never Used  Substance and Sexual Activity   Alcohol use: Yes    Comment: 4 to 5 x week 1 drink   Drug use: No   Sexual activity: Not on file  Other Topics Concern   Not on file  Social History Narrative   Not on file   Social Determinants of Health   Financial Resource Strain: Not on file  Food Insecurity: No Food Insecurity  (05/09/2023)   Hunger Vital Sign    Worried About Running Out of Food in the Last Year: Never true    Ran Out of Food in the Last Year: Never true  Transportation Needs: No Transportation Needs (05/09/2023)   PRAPARE - Administrator, Civil Service (Medical): No    Lack of Transportation (Non-Medical): No  Physical Activity: Not on file  Stress: Not on file  Social Connections: Not on file     Family History: The patient's family history includes Heart attack in his mother; Liver cancer in his father; Multiple sclerosis in  his sister.  ROS:   Please see the history of present illness.    ROS  All other systems reviewed and negative.   EKGs/Labs/Other Studies Reviewed:    EKG Interpretation Date/Time:  Wednesday September 20 2023 08:03:07 EDT Ventricular Rate:  76 PR Interval:  192 QRS Duration:  92 QT Interval:  386 QTC Calculation: 434 R Axis:   7  Text Interpretation: Sinus rhythm with occasional Premature ventricular complexes When compared with ECG of 11-Aug-2020 09:10, PREVIOUS ECG IS PRESENT Confirmed by Armanda Magic ([id number]) on 09/20/2023 8:19:57 AM    The following studies were reviewed today:  echo  Recent Labs: 02/28/2023: ALT 12   Recent Lipid Panel    Component Value Date/Time   CHOL 203 (H) 02/28/2023 0859   TRIG 60 02/28/2023 0859   HDL 55 02/28/2023 0859   CHOLHDL 3.7 02/28/2023 0859   LDLCALC 137 (H) 02/28/2023 0859    Physical Exam:    VS:  BP 120/82   Pulse 76   Ht 6' 4.5" (1.943 m)   Wt 225 lb 6.4 oz (102.2 kg)   SpO2 97%   BMI 27.08 kg/m     Wt Readings from Last 3 Encounters:  09/20/23 225 lb 6.4 oz (102.2 kg)  05/09/23 231 lb 6.4 oz (105 kg)  11/25/22 220 lb (99.8 kg)    GEN: Well nourished, well developed in no acute distress HEENT: Normal NECK: No JVD; No carotid bruits LYMPHATICS: No lymphadenopathy CARDIAC:RRR, no murmurs, rubs, gallops RESPIRATORY:  Clear to auscultation without rales, wheezing or rhonchi   ABDOMEN: Soft, non-tender, non-distended MUSCULOSKELETAL:  No edema; No deformity  SKIN: Warm and dry NEUROLOGIC:  Alert and oriented x 3 PSYCHIATRIC:  Normal affect  ASSESSMENT:    1. PAF (paroxysmal atrial fibrillation) (HCC)   2. Essential hypertension, benign   3. Ascending aorta dilatation (HCC)   4. Mixed hyperlipidemia   5. Agatston coronary artery calcium score between 100 and 199     PLAN:    In order of problems listed above:  1.  PAF -he remains in NSR and denies any palpitations -His CHADS2VASC score is 2 (age>65, coronary Ca) but he has only had 2 episodes of A. Fib in the setting of a kidney stone and 1 in the setting of sepsis that  was likely triggered by increased catecholamine surge and therefore not on anticoagulation  2.  Hx of remote HTN -resolved after significant weight loss and has no further HTN  3.  Mildly dilated ascending aorta -CTA 8/22 and 12/2022 measured 41mm -repeat echo 11/2023  4.  HLD -LDL goal < 70 (coronary Ca) -he stopped his statin due to achiness>>LDL went back up to 137 and started on Zetia 10mg  daily -repeat an FLP and ALT   5.  Coronary artery calcifications -coronary Ca score 105 on 07/28/2020 -denies any anginal sx -statin intolerant -continue Zetia     Medication Adjustments/Labs and Tests Ordered: Current medicines are reviewed at length with the patient today.  Concerns regarding medicines are outlined above.  Orders Placed This Encounter  Procedures   EKG 12-Lead   No orders of the defined types were placed in this encounter.   Signed, Armanda Magic, MD  09/20/2023 8:29 AM    [facility]

## 2023-09-20 ENCOUNTER — Ambulatory Visit: Payer: Medicare HMO

## 2023-09-20 ENCOUNTER — Ambulatory Visit: Payer: Medicare HMO | Attending: Cardiology | Admitting: Cardiology

## 2023-09-20 ENCOUNTER — Encounter: Payer: Self-pay | Admitting: Cardiology

## 2023-09-20 VITALS — BP 120/82 | HR 76 | Ht 76.5 in | Wt 225.4 lb

## 2023-09-20 DIAGNOSIS — I77810 Thoracic aortic ectasia: Secondary | ICD-10-CM

## 2023-09-20 DIAGNOSIS — E782 Mixed hyperlipidemia: Secondary | ICD-10-CM

## 2023-09-20 DIAGNOSIS — Z79899 Other long term (current) drug therapy: Secondary | ICD-10-CM | POA: Diagnosis not present

## 2023-09-20 DIAGNOSIS — I480 Paroxysmal atrial fibrillation: Secondary | ICD-10-CM | POA: Diagnosis not present

## 2023-09-20 DIAGNOSIS — I10 Essential (primary) hypertension: Secondary | ICD-10-CM

## 2023-09-20 DIAGNOSIS — R931 Abnormal findings on diagnostic imaging of heart and coronary circulation: Secondary | ICD-10-CM

## 2023-09-20 NOTE — Addendum Note (Signed)
Addended by: Dossie Arbour on: 09/20/2023 08:37 AM   Modules accepted: Orders

## 2023-09-20 NOTE — Patient Instructions (Signed)
Medication Instructions:  Your physician recommends that you continue on your current medications as directed. Please refer to the Current Medication list given to you today.  *If you need a refill on your cardiac medications before your next appointment, please call your pharmacy*   Lab Work: none If you have labs (blood work) drawn today and your tests are completely normal, you will receive your results only by: MyChart Message (if you have MyChart) OR A paper copy in the mail If you have any lab test that is abnormal or we need to change your treatment, we will call you to review the results.   Testing/Procedures: Your physician has requested that you have an echocardiogram. Echocardiography is a painless test that uses sound waves to create images of your heart. It provides your doctor with information about the size and shape of your heart and how well your heart's chambers and valves are working. This procedure takes approximately one hour. There are no restrictions for this procedure. Please do NOT wear cologne, perfume, aftershave, or lotions (deodorant is allowed). Please arrive 15 minutes prior to your appointment time. To be done in December 2024   Follow-Up: At [facility], you and your health needs are our priority.  As part of our continuing mission to provide you with exceptional heart care, we have created designated Provider Care Teams.  These Care Teams include your primary Cardiologist (physician) and Advanced Practice Providers (APPs -  Physician Assistants and Nurse Practitioners) who all work together to provide you with the care you need, when you need it.  We recommend signing up for the patient portal called "MyChart".  Sign up information is provided on this After Visit Summary.  MyChart is used to connect with patients for Virtual Visits (Telemedicine).  Patients are able to view lab/test results, encounter notes, upcoming appointments, etc.  Non-urgent  messages can be sent to your provider as well.   To learn more about what you can do with MyChart, go to [email or web address].    Your next appointment:   12 month(s)  Provider:   Armanda Magic, MD     Other Instructions

## 2023-09-21 LAB — LIPID PANEL
Chol/HDL Ratio: 3.1 ratio (ref 0.0–5.0)
Cholesterol, Total: 153 mg/dL (ref 100–199)
HDL: 49 mg/dL (ref >39)
LDL Chol Calc (NIH): 86 mg/dL (ref 0–99)
Triglycerides: 95 mg/dL (ref 0–149)
VLDL Cholesterol Cal: 18 mg/dL (ref 5–40)

## 2023-09-21 LAB — ALT: ALT: 11 IU/L (ref 0–44)

## 2023-09-26 ENCOUNTER — Telehealth: Payer: Self-pay

## 2023-09-26 DIAGNOSIS — E782 Mixed hyperlipidemia: Secondary | ICD-10-CM

## 2023-09-26 NOTE — Telephone Encounter (Signed)
-----   Message from Nurse Kinnie Feil C sent at 09/21/2023  9:11 AM EDT -----  ----- Message ----- From: Quintella Reichert, MD Sent: 09/21/2023   8:54 AM EDT To: Frutoso Schatz, RN  Lipids still not at goal.  Please forward to lipid clinic for further recommendations.

## 2023-09-26 NOTE — Telephone Encounter (Signed)
Call to patient, explained that Lipids are still not at goal and LDL in particular remains elevated, Dr. Malachy Mood recommends referral to lipid clinic. Patient verbalizes understanding, referral placed.

## 2023-09-28 DIAGNOSIS — H52223 Regular astigmatism, bilateral: Secondary | ICD-10-CM | POA: Diagnosis not present

## 2023-10-04 ENCOUNTER — Ambulatory Visit: Payer: Medicare HMO | Attending: Cardiology | Admitting: Pharmacist

## 2023-10-04 DIAGNOSIS — E782 Mixed hyperlipidemia: Secondary | ICD-10-CM

## 2023-10-04 MED ORDER — ROSUVASTATIN CALCIUM 5 MG PO TABS: 5.0000 mg | ORAL_TABLET | Freq: Every day | ORAL | 3 refills | Status: DC

## 2023-10-04 NOTE — Assessment & Plan Note (Signed)
Assessment: LDL-C is just above goal of <70 He really does not have very many risk factors other than age. His CAC was 44th percentile, but will treat as CAD w/ goal of <70 Reviewed options for lowering LDL cholesterol, including PCSK-9 inhibitors, bempedoic acid and inclisiran.  Discussed mechanisms of action, dosing, side effects and potential decreases in LDL cholesterol.  Also reviewed cost information and potential options for patient assistance.  Patient willing to rechallenge with rosuvastatin I suggested we could start with every other day dosing but patient wished to start with daily Encouraged him to remain as active as he can as his weakness improved  Plan: Start rosuvastatin 5mg  daily STOP zetia for now. Can be resumed if LDL-C is above goal on rosuvastatin Recheck labs 12/5 -same day as echo

## 2023-10-04 NOTE — Progress Notes (Signed)
Patient ID: David Vargas                 DOB: February 20, 1951                    MRN: [id number]      HPI: David Vargas is a 72 y.o. male patient referred to lipid clinic by Dr. Mayford Knife. PMH is significant for afib, HTN (resolved after weightloss) HLD, mallory weiss tear, CAC 105 in 2021, Cath in 2005 no significant CAD, prostate cancer (just finished radiation).  Patient presents today to clinic. He is a very kind man who just finished radiation for prostate cancer a few months ago. Still struggling with fatigue/weakness. Use to walk 18 holes of golf, but cannot even walk 9 yet. He states that he thinks rosuvastatin did not effect him as much as atorvastatin. Does not want to do injections. Discussed his Prevent and MESA scores. He is intermediate risk.   Prevent 7.8% MESA 11.4%   Reviewed options for lowering LDL cholesterol, including PCSK-9 inhibitors, bempedoic acid and inclisiran.  Discussed mechanisms of action, dosing, side effects and potential decreases in LDL cholesterol.  Also reviewed cost information and potential options for patient assistance.    Current Medications: ezetimibe 10mg  daily Intolerances: rosuvastatin 10mg  daily, atorvastatin 20mg  daily, pravastatin 10mg  daily (weakness) Risk Factors: CAC 105 (44th percentile), age LDL-C goal: <70 ApoB goal: <80  Diet: not discussed  Exercise: golf- use to walk the course but cannot now due to weakness from radiation, yard work, driving range  Family History: mother had MI at 64 (smoked)  Social History: no tobacco  Labs: Lipid Panel     Component Value Date/Time   CHOL 153 09/20/2023 0746   TRIG 95 09/20/2023 0746   HDL 49 09/20/2023 0746   CHOLHDL 3.1 09/20/2023 0746   LDLCALC 86 09/20/2023 0746   LABVLDL 18 09/20/2023 0746    Past Medical History:  Diagnosis Date   Agatston coronary artery calcium score between 100 and 199    Ca score 105 on CT 07/2020   Arthritis    Basal cell carcinoma    followed by  dermatology   Cancer [facility])    melanoma - left shoulder 8 to 9 yrs ago   Complication of anesthesia    slow to wake up   GERD (gastroesophageal reflux disease)    H/O colonoscopy    2009, polypoid colorectal mucosa found, Dr. Randa Evens advised repeat in 3 years   Hiatal hernia    upper GI in 2003   History of blood transfusion    7 units given summer 2021 for wallory-weiss tear   History of hypertension 06/22/2018   no htn in last 2 years per pt on 06-22-2021 after 60 pound weight loss   History of kidney stones    History of nuclear stress test 2005   abnormal, cardiac cath done    Left inguinal hernia 06/22/2021   PAF (paroxysmal atrial fibrillation) (HCC)    he has had 2 episodes in the past one associated with kidney stone and the other urosepsis.  CHA2DS2/VASC score 1 (age > 43 >>he has remote hx of HTN but this resolved after weight loss) therefore will not anticoagulate    Current Outpatient Medications on File Prior to Visit  Medication Sig Dispense Refill   acetaminophen (TYLENOL) 325 MG tablet Take 650 mg by mouth every 6 (six) hours as needed for mild pain or fever.     Coenzyme Q10 (  COQ10 PO) Take by mouth daily.     COMIRNATY syringe      famotidine (PEPCID) 20 MG tablet Take 20 mg by mouth as needed.     fluticasone (FLONASE) 50 MCG/ACT nasal spray Place into both nostrils.     pantoprazole (PROTONIX) 40 MG tablet Take 1 tablet (40 mg total) by mouth daily. 30 tablet 0   tadalafil (CIALIS) 10 MG tablet Take 5 mg by mouth daily as needed for erectile dysfunction.      tamsulosin (FLOMAX) 0.4 MG CAPS capsule Take 0.4 mg by mouth as needed. For kidney stones     No current facility-administered medications on file prior to visit.    Allergies  Allergen Reactions   Atorvastatin     Muscle aches   Simvastatin     Muscle aches    Assessment/Plan:  1. Hyperlipidemia -  Hyperlipidemia Assessment: LDL-C is just above goal of <70 He really does not have very many  risk factors other than age. His CAC was 44th percentile, but will treat as CAD w/ goal of <70 Reviewed options for lowering LDL cholesterol, including PCSK-9 inhibitors, bempedoic acid and inclisiran.  Discussed mechanisms of action, dosing, side effects and potential decreases in LDL cholesterol.  Also reviewed cost information and potential options for patient assistance.  Patient willing to rechallenge with rosuvastatin I suggested we could start with every other day dosing but patient wished to start with daily Encouraged him to remain as active as he can as his weakness improved  Plan: Start rosuvastatin 5mg  daily STOP zetia for now. Can be resumed if LDL-C is above goal on rosuvastatin Recheck labs 12/5 -same day as echo    Thank you,  Olene Floss, Pharm.D, BCACP, BCPS, CPP [facility] [address] [place], [place] [address]  Phone: [phone]; Fax: [phone]

## 2023-10-04 NOTE — Patient Instructions (Signed)
Start taking rosuvastatin 5mg  daily Stop zetia for now Fasting labs same day as echo 12/5 Please call me at [phone] with any issues

## 2023-10-23 DIAGNOSIS — C61 Malignant neoplasm of prostate: Secondary | ICD-10-CM | POA: Diagnosis not present

## 2023-10-23 DIAGNOSIS — N401 Enlarged prostate with lower urinary tract symptoms: Secondary | ICD-10-CM | POA: Diagnosis not present

## 2023-10-23 DIAGNOSIS — R350 Frequency of micturition: Secondary | ICD-10-CM | POA: Diagnosis not present

## 2023-10-23 DIAGNOSIS — N5201 Erectile dysfunction due to arterial insufficiency: Secondary | ICD-10-CM | POA: Diagnosis not present

## 2023-11-07 DIAGNOSIS — Z85828 Personal history of other malignant neoplasm of skin: Secondary | ICD-10-CM | POA: Diagnosis not present

## 2023-11-07 DIAGNOSIS — L570 Actinic keratosis: Secondary | ICD-10-CM | POA: Diagnosis not present

## 2023-11-07 DIAGNOSIS — L821 Other seborrheic keratosis: Secondary | ICD-10-CM | POA: Diagnosis not present

## 2023-11-07 DIAGNOSIS — D225 Melanocytic nevi of trunk: Secondary | ICD-10-CM | POA: Diagnosis not present

## 2023-11-07 DIAGNOSIS — L905 Scar conditions and fibrosis of skin: Secondary | ICD-10-CM | POA: Diagnosis not present

## 2023-11-07 DIAGNOSIS — Z85820 Personal history of malignant melanoma of skin: Secondary | ICD-10-CM | POA: Diagnosis not present

## 2023-11-07 DIAGNOSIS — D2371 Other benign neoplasm of skin of right lower limb, including hip: Secondary | ICD-10-CM | POA: Diagnosis not present

## 2023-11-15 ENCOUNTER — Other Ambulatory Visit: Payer: Self-pay

## 2023-11-15 DIAGNOSIS — E782 Mixed hyperlipidemia: Secondary | ICD-10-CM

## 2023-11-30 ENCOUNTER — Other Ambulatory Visit (HOSPITAL_COMMUNITY): Payer: Medicare HMO

## 2023-11-30 ENCOUNTER — Ambulatory Visit (HOSPITAL_COMMUNITY): Payer: Medicare HMO | Attending: Internal Medicine

## 2023-11-30 ENCOUNTER — Ambulatory Visit: Payer: Medicare HMO

## 2023-11-30 DIAGNOSIS — I77810 Thoracic aortic ectasia: Secondary | ICD-10-CM | POA: Diagnosis not present

## 2023-11-30 DIAGNOSIS — E782 Mixed hyperlipidemia: Secondary | ICD-10-CM | POA: Insufficient documentation

## 2023-11-30 DIAGNOSIS — I480 Paroxysmal atrial fibrillation: Secondary | ICD-10-CM | POA: Diagnosis not present

## 2023-11-30 LAB — ECHOCARDIOGRAM COMPLETE
Area-P 1/2: 2.1 cm2
S' Lateral: 2.6 cm

## 2023-12-01 ENCOUNTER — Telehealth: Payer: Self-pay | Admitting: Pharmacist

## 2023-12-01 ENCOUNTER — Telehealth: Payer: Self-pay

## 2023-12-01 LAB — HEPATIC FUNCTION PANEL
ALT: 12 [IU]/L (ref 0–44)
AST: 16 [IU]/L (ref 0–40)
Albumin: 4.4 g/dL (ref 3.8–4.8)
Alkaline Phosphatase: 98 [IU]/L (ref 44–121)
Bilirubin Total: 0.6 mg/dL (ref 0.0–1.2)
Bilirubin, Direct: 0.21 mg/dL (ref 0.00–0.40)
Total Protein: 6.8 g/dL (ref 6.0–8.5)

## 2023-12-01 LAB — LIPID PANEL
Chol/HDL Ratio: 2.8 {ratio} (ref 0.0–5.0)
Cholesterol, Total: 154 mg/dL (ref 100–199)
HDL: 55 mg/dL (ref >39)
LDL Chol Calc (NIH): 86 mg/dL (ref 0–99)
Triglycerides: 66 mg/dL (ref 0–149)
VLDL Cholesterol Cal: 13 mg/dL (ref 5–40)

## 2023-12-01 NOTE — Telephone Encounter (Signed)
-----   Message from Armanda Magic sent at 11/30/2023 11:02 AM EST ----- Echo showed normal pumping function of the heart with moderate thickened of the heart muscle, trivial leakiness of the MV.  The aortic root and ascending aorta were read out as aneurysm but they are actually normal measurement when indexed for BSA.

## 2023-12-01 NOTE — Telephone Encounter (Signed)
Spoke with patient about lab work.  LDL-C is 86.  He is tolerating rosuvastatin 5 mg daily well.  Previously had taken ezetimibe without any issue.  Goal LDL-C is less than 70.  Will resume ezetimibe 10 mg daily along with his rosuvastatin 5 mg daily.  Recheck labs in about 3 months.  Patient is in agreement with plan.  He will call me if he needs a new prescription for his that ezetimibe or if he has any issues.

## 2023-12-01 NOTE — Telephone Encounter (Signed)
Call to patient to discuss echo showed normal pumping function of the heart with moderate thickened of the heart muscle, trivial leakiness of the MV.  The aortic root and ascending aorta were read out as aneurysm but they are actually normal measurement when indexed for BSA. Patient verbalizes understanding.

## 2024-01-12 DIAGNOSIS — Z6828 Body mass index (BMI) 28.0-28.9, adult: Secondary | ICD-10-CM | POA: Diagnosis not present

## 2024-01-12 DIAGNOSIS — R2241 Localized swelling, mass and lump, right lower limb: Secondary | ICD-10-CM | POA: Diagnosis not present

## 2024-02-05 ENCOUNTER — Encounter: Payer: Self-pay | Admitting: Pharmacist

## 2024-02-26 DIAGNOSIS — J069 Acute upper respiratory infection, unspecified: Secondary | ICD-10-CM | POA: Diagnosis not present

## 2024-02-26 DIAGNOSIS — Z6828 Body mass index (BMI) 28.0-28.9, adult: Secondary | ICD-10-CM | POA: Diagnosis not present

## 2024-03-07 ENCOUNTER — Other Ambulatory Visit: Payer: Self-pay

## 2024-03-07 DIAGNOSIS — E782 Mixed hyperlipidemia: Secondary | ICD-10-CM | POA: Diagnosis not present

## 2024-03-07 DIAGNOSIS — Z79899 Other long term (current) drug therapy: Secondary | ICD-10-CM | POA: Diagnosis not present

## 2024-03-07 LAB — LIPID PANEL
Chol/HDL Ratio: 2.2 ratio (ref 0.0–5.0)
Cholesterol, Total: 104 mg/dL (ref 100–199)
HDL: 47 mg/dL (ref >39)
LDL Chol Calc (NIH): 46 mg/dL (ref 0–99)
Triglycerides: 45 mg/dL (ref 0–149)
VLDL Cholesterol Cal: 11 mg/dL (ref 5–40)

## 2024-03-07 LAB — HEPATIC FUNCTION PANEL
ALT: 20 IU/L (ref 0–44)
AST: 13 IU/L (ref 0–40)
Albumin: 4 g/dL (ref 3.8–4.8)
Alkaline Phosphatase: 100 IU/L (ref 44–121)
Bilirubin Total: 0.5 mg/dL (ref 0.0–1.2)
Bilirubin, Direct: 0.21 mg/dL (ref 0.00–0.40)
Total Protein: 6.5 g/dL (ref 6.0–8.5)

## 2024-03-08 ENCOUNTER — Encounter: Payer: Self-pay | Admitting: Pharmacist

## 2024-03-11 MED ORDER — ROSUVASTATIN CALCIUM 5 MG PO TABS: 5.0000 mg | ORAL_TABLET | ORAL | 3 refills | Status: AC

## 2024-03-11 MED ORDER — EZETIMIBE 10 MG PO TABS: 10.0000 mg | ORAL_TABLET | Freq: Every day | ORAL | 3 refills | Status: AC

## 2024-03-11 NOTE — Addendum Note (Signed)
 Addended by: Malena Peer D on: 03/11/2024 04:43 PM   Modules accepted: Orders

## 2024-03-20 DIAGNOSIS — N401 Enlarged prostate with lower urinary tract symptoms: Secondary | ICD-10-CM | POA: Diagnosis not present

## 2024-03-20 DIAGNOSIS — R3915 Urgency of urination: Secondary | ICD-10-CM | POA: Diagnosis not present

## 2024-03-20 DIAGNOSIS — R350 Frequency of micturition: Secondary | ICD-10-CM | POA: Diagnosis not present

## 2024-03-20 DIAGNOSIS — N5201 Erectile dysfunction due to arterial insufficiency: Secondary | ICD-10-CM | POA: Diagnosis not present

## 2024-03-20 DIAGNOSIS — Z8546 Personal history of malignant neoplasm of prostate: Secondary | ICD-10-CM | POA: Diagnosis not present

## 2024-05-06 DIAGNOSIS — Z85820 Personal history of malignant melanoma of skin: Secondary | ICD-10-CM | POA: Diagnosis not present

## 2024-05-06 DIAGNOSIS — D2271 Melanocytic nevi of right lower limb, including hip: Secondary | ICD-10-CM | POA: Diagnosis not present

## 2024-05-06 DIAGNOSIS — L905 Scar conditions and fibrosis of skin: Secondary | ICD-10-CM | POA: Diagnosis not present

## 2024-05-06 DIAGNOSIS — D225 Melanocytic nevi of trunk: Secondary | ICD-10-CM | POA: Diagnosis not present

## 2024-05-06 DIAGNOSIS — L821 Other seborrheic keratosis: Secondary | ICD-10-CM | POA: Diagnosis not present

## 2024-05-06 DIAGNOSIS — L738 Other specified follicular disorders: Secondary | ICD-10-CM | POA: Diagnosis not present

## 2024-05-06 DIAGNOSIS — D2272 Melanocytic nevi of left lower limb, including hip: Secondary | ICD-10-CM | POA: Diagnosis not present

## 2024-05-06 DIAGNOSIS — L570 Actinic keratosis: Secondary | ICD-10-CM | POA: Diagnosis not present

## 2024-05-06 DIAGNOSIS — D2262 Melanocytic nevi of left upper limb, including shoulder: Secondary | ICD-10-CM | POA: Diagnosis not present

## 2024-05-06 DIAGNOSIS — Z85828 Personal history of other malignant neoplasm of skin: Secondary | ICD-10-CM | POA: Diagnosis not present

## 2024-05-06 DIAGNOSIS — D2261 Melanocytic nevi of right upper limb, including shoulder: Secondary | ICD-10-CM | POA: Diagnosis not present

## 2024-05-21 DIAGNOSIS — Z860101 Personal history of adenomatous and serrated colon polyps: Secondary | ICD-10-CM | POA: Diagnosis not present

## 2024-05-21 DIAGNOSIS — I4891 Unspecified atrial fibrillation: Secondary | ICD-10-CM | POA: Diagnosis not present

## 2024-05-27 DIAGNOSIS — Z09 Encounter for follow-up examination after completed treatment for conditions other than malignant neoplasm: Secondary | ICD-10-CM | POA: Diagnosis not present

## 2024-05-27 DIAGNOSIS — Z860101 Personal history of adenomatous and serrated colon polyps: Secondary | ICD-10-CM | POA: Diagnosis not present

## 2024-05-27 DIAGNOSIS — K635 Polyp of colon: Secondary | ICD-10-CM | POA: Diagnosis not present

## 2024-05-27 DIAGNOSIS — K5730 Diverticulosis of large intestine without perforation or abscess without bleeding: Secondary | ICD-10-CM | POA: Diagnosis not present

## 2024-05-27 DIAGNOSIS — D125 Benign neoplasm of sigmoid colon: Secondary | ICD-10-CM | POA: Diagnosis not present

## 2024-05-29 DIAGNOSIS — D125 Benign neoplasm of sigmoid colon: Secondary | ICD-10-CM | POA: Diagnosis not present

## 2024-05-29 DIAGNOSIS — K635 Polyp of colon: Secondary | ICD-10-CM | POA: Diagnosis not present

## 2024-05-30 ENCOUNTER — Encounter: Payer: Self-pay | Admitting: Pharmacist

## 2024-05-30 DIAGNOSIS — E785 Hyperlipidemia, unspecified: Secondary | ICD-10-CM | POA: Diagnosis not present

## 2024-05-30 DIAGNOSIS — D509 Iron deficiency anemia, unspecified: Secondary | ICD-10-CM | POA: Diagnosis not present

## 2024-05-30 DIAGNOSIS — E559 Vitamin D deficiency, unspecified: Secondary | ICD-10-CM | POA: Diagnosis not present

## 2024-05-30 DIAGNOSIS — Z125 Encounter for screening for malignant neoplasm of prostate: Secondary | ICD-10-CM | POA: Diagnosis not present

## 2024-06-13 DIAGNOSIS — I10 Essential (primary) hypertension: Secondary | ICD-10-CM | POA: Diagnosis not present

## 2024-06-13 DIAGNOSIS — Z6827 Body mass index (BMI) 27.0-27.9, adult: Secondary | ICD-10-CM | POA: Diagnosis not present

## 2024-06-13 DIAGNOSIS — Z0000 Encounter for general adult medical examination without abnormal findings: Secondary | ICD-10-CM | POA: Diagnosis not present

## 2024-06-13 DIAGNOSIS — J309 Allergic rhinitis, unspecified: Secondary | ICD-10-CM | POA: Diagnosis not present

## 2024-07-09 ENCOUNTER — Other Ambulatory Visit: Payer: Self-pay | Admitting: Endocrinology

## 2024-07-09 DIAGNOSIS — E041 Nontoxic single thyroid nodule: Secondary | ICD-10-CM | POA: Diagnosis not present

## 2024-07-11 ENCOUNTER — Ambulatory Visit
Admission: RE | Admit: 2024-07-11 | Discharge: 2024-07-11 | Disposition: A | Discharge status: Home / Self Care | Source: Ambulatory Visit | Attending: Endocrinology | Admitting: Endocrinology

## 2024-07-11 DIAGNOSIS — E041 Nontoxic single thyroid nodule: Secondary | ICD-10-CM

## 2024-09-17 DIAGNOSIS — N401 Enlarged prostate with lower urinary tract symptoms: Secondary | ICD-10-CM | POA: Diagnosis not present

## 2024-09-17 DIAGNOSIS — Z8546 Personal history of malignant neoplasm of prostate: Secondary | ICD-10-CM | POA: Diagnosis not present

## 2024-10-01 DIAGNOSIS — R399 Unspecified symptoms and signs involving the genitourinary system: Secondary | ICD-10-CM | POA: Diagnosis not present

## 2024-10-02 DIAGNOSIS — R3912 Poor urinary stream: Secondary | ICD-10-CM | POA: Diagnosis not present

## 2024-10-02 DIAGNOSIS — N200 Calculus of kidney: Secondary | ICD-10-CM | POA: Diagnosis not present

## 2024-10-02 DIAGNOSIS — R351 Nocturia: Secondary | ICD-10-CM | POA: Diagnosis not present

## 2024-10-02 DIAGNOSIS — N401 Enlarged prostate with lower urinary tract symptoms: Secondary | ICD-10-CM | POA: Diagnosis not present

## 2024-10-02 DIAGNOSIS — N5201 Erectile dysfunction due to arterial insufficiency: Secondary | ICD-10-CM | POA: Diagnosis not present

## 2024-10-02 DIAGNOSIS — C61 Malignant neoplasm of prostate: Secondary | ICD-10-CM | POA: Diagnosis not present

## 2024-11-05 DIAGNOSIS — L821 Other seborrheic keratosis: Secondary | ICD-10-CM | POA: Diagnosis not present

## 2024-11-05 DIAGNOSIS — D225 Melanocytic nevi of trunk: Secondary | ICD-10-CM | POA: Diagnosis not present

## 2024-11-05 DIAGNOSIS — D2261 Melanocytic nevi of right upper limb, including shoulder: Secondary | ICD-10-CM | POA: Diagnosis not present

## 2024-11-05 DIAGNOSIS — D2262 Melanocytic nevi of left upper limb, including shoulder: Secondary | ICD-10-CM | POA: Diagnosis not present

## 2024-11-05 DIAGNOSIS — Z85828 Personal history of other malignant neoplasm of skin: Secondary | ICD-10-CM | POA: Diagnosis not present

## 2024-11-05 DIAGNOSIS — L905 Scar conditions and fibrosis of skin: Secondary | ICD-10-CM | POA: Diagnosis not present

## 2024-11-05 DIAGNOSIS — L820 Inflamed seborrheic keratosis: Secondary | ICD-10-CM | POA: Diagnosis not present

## 2024-11-05 DIAGNOSIS — Z85820 Personal history of malignant melanoma of skin: Secondary | ICD-10-CM | POA: Diagnosis not present

## 2024-11-05 DIAGNOSIS — D692 Other nonthrombocytopenic purpura: Secondary | ICD-10-CM | POA: Diagnosis not present

## 2024-11-05 DIAGNOSIS — L570 Actinic keratosis: Secondary | ICD-10-CM | POA: Diagnosis not present

## 2024-12-10 DIAGNOSIS — E785 Hyperlipidemia, unspecified: Secondary | ICD-10-CM | POA: Diagnosis not present

## 2024-12-10 LAB — LAB REPORT - SCANNED: EGFR: 89

## 2024-12-13 DIAGNOSIS — I77810 Thoracic aortic ectasia: Secondary | ICD-10-CM | POA: Diagnosis not present

## 2024-12-13 DIAGNOSIS — E785 Hyperlipidemia, unspecified: Secondary | ICD-10-CM | POA: Diagnosis not present

## 2024-12-13 DIAGNOSIS — I10 Essential (primary) hypertension: Secondary | ICD-10-CM | POA: Diagnosis not present

## 2024-12-13 DIAGNOSIS — N529 Male erectile dysfunction, unspecified: Secondary | ICD-10-CM | POA: Diagnosis not present

## 2024-12-13 DIAGNOSIS — C439 Malignant melanoma of skin, unspecified: Secondary | ICD-10-CM | POA: Diagnosis not present

## 2024-12-13 DIAGNOSIS — Z8679 Personal history of other diseases of the circulatory system: Secondary | ICD-10-CM | POA: Diagnosis not present

## 2024-12-13 DIAGNOSIS — C61 Malignant neoplasm of prostate: Secondary | ICD-10-CM | POA: Diagnosis not present

## 2024-12-13 DIAGNOSIS — J309 Allergic rhinitis, unspecified: Secondary | ICD-10-CM | POA: Diagnosis not present

## 2024-12-13 DIAGNOSIS — K219 Gastro-esophageal reflux disease without esophagitis: Secondary | ICD-10-CM | POA: Diagnosis not present

## 2024-12-13 DIAGNOSIS — I700 Atherosclerosis of aorta: Secondary | ICD-10-CM | POA: Diagnosis not present

## 2024-12-13 DIAGNOSIS — J432 Centrilobular emphysema: Secondary | ICD-10-CM | POA: Diagnosis not present

## 2025-01-08 ENCOUNTER — Other Ambulatory Visit (HOSPITAL_COMMUNITY): Payer: Self-pay

## 2025-01-08 ENCOUNTER — Other Ambulatory Visit: Payer: Self-pay | Admitting: Urology

## 2025-01-08 MED ORDER — ONDANSETRON HCL 4 MG PO TABS
4.0000 mg | ORAL_TABLET | Freq: Four times a day (QID) | ORAL | 1 refills | Status: AC | PRN
  Filled 2025-01-08: qty 12, 3d supply, fill #0

## 2025-01-08 MED ORDER — HYDROCODONE-ACETAMINOPHEN 5-325 MG PO TABS
1.0000 | ORAL_TABLET | Freq: Four times a day (QID) | ORAL | 0 refills | Status: AC | PRN
  Filled 2025-01-08: qty 15, 2d supply, fill #0

## 2025-01-08 NOTE — Progress Notes (Signed)
 " Cardiology Office Note:  .   Date:  01/09/2025  ID:  David Vargas, DOB Dec 05, 1951, MRN [id number] PCP: [facility] Providers Cardiologist:  Wilbert Bihari, MD {  History of Present Illness: .   David Vargas is a 74 y.o. male with history of PAF in the setting of hemorrhagic shock secondary to Mallory-Weiss tear 2021, coronary artery calcifications on CT, hypertension, hyperlipidemia, dilated aortic root.     Atrial fibrillation Atrial fibrillation in the setting of kidney stone 07/2020 admitted for hemorrhagic shock secondary to Mallory-Weiss tear.  Had atrial fibrillation and unsuccessful cardioversion x 3 in the ER.  Had 2 endoclips and duodenal ulcer.  Discharged on amiodarone  x 21 days then stopped.  No anticoagulation given CHADVASC of 1.  Back in sinus rhythm at follow-up  CAD 2005 reportedly cardiac catheterization with no significant LAD. 07/2020 CAC scoring of 105 dilated ascending aorta 4 cm.  Social history  No history of tobacco use.  Occasional drinks 3 times per week.  No drugs. Moderately active 3-4 times per week.  Mother had MI at the age of 23.     Patient with history of atrial fibrillation only in the setting of acute illness and hemorrhagic shock 2021.  Otherwise has been maintaining sinus rhythm and not on anticoagulation.  They have had stable aortic root dilatation.  Last seen 08/2023 still in sinus rhythm.  No complaints.  Overdue for follow-up now.  Today patient presents for annual follow-up.  He has absolutely no complaints and still very much physically active going to golf and golfing all day long without any exertional symptoms.  Has a trip planned for the [place] next [place].  Unfortunately has another kidney stone with plans of lithotripsy in the future.  No other cardiac complaints as described below.  Has not noticed any tachycardia or palpitations.   ROS: Denies: Chest pain, shortness of breath,  orthopnea, peripheral edema, palpitations, syncope, decreased exercise capacity, fatigue, dizziness.   Studies Reviewed: SABRA    EKG Interpretation Date/Time:  Thursday January 09 2025 09:29:03 EST Ventricular Rate:  66 PR Interval:  206 QRS Duration:  96 QT Interval:  398 QTC Calculation: 417 R Axis:   -20  Text Interpretation: Sinus rhythm with occasional Premature ventricular complexes When compared with ECG of 20-Sep-2023 08:03, No significant change was found Confirmed by Darryle Currier [phone]) on 01/09/2025 9:49:29 AM    Risk Assessment/Calculations:           Physical Exam:   VS:  BP 124/78   Pulse 66   Ht 6' 4.5 (1.943 m)   Wt 227 lb (103 kg)   SpO2 97%   BMI 27.27 kg/m    Wt Readings from Last 3 Encounters:  01/09/25 227 lb (103 kg)  09/20/23 225 lb 6.4 oz (102.2 kg)  05/09/23 231 lb 6.4 oz (105 kg)    GEN: Well nourished, well developed in no acute distress NECK: No JVD; No carotid bruits CARDIAC: RRR, no murmurs, rubs, gallops RESPIRATORY:  Clear to auscultation without rales, wheezing or rhonchi  ABDOMEN: Soft, non-tender, non-distended EXTREMITIES:  No edema; No deformity   ASSESSMENT AND PLAN: .    Paroxysmal atrial fibrillation Episode 2021 in the setting of hemorrhagic shock.  Has been maintaining sinus rhythm since.  Maintaining sinus rhythm on EKG today.  Not on anticoagulation.  PVCs Occasional PVCs noted on EKG today.  May have decently high burden based off of physical  exam.  Completely asymptomatic with no palpitations.  Offered 3-day heart monitor to assess burden, he would like to look at echocardiogram first before considering.  Heart rate in office 66, but may tolerate low-dose beta-blocker if needed.  Coronary calcifications Hyperlipidemia -07/2020 CAC scoring of 105 Reports myalgias with statin therapy.  But tolerates rosuvastatin  5 mg 3 times per week.  Takes Zetia  daily.  Cholesterol is well-controlled.  11/2024 LDL 47. Discussed initiation of  aspirin , with hemorrhagic shock history he would like to defer which is reasonable.  Aortic dilatation 11/2023 preserved biventricular function with no significant valve disease.  40 mm aneurysm of the aortic root.  Repeat echocardiogram.  Hypertension Well-controlled.  124/78.  He reports taking losartan  25 mg 3 times per week, we will add to his medication list.   Dispo: 1 year follow-up with Dr. Shlomo  Signed, Thom LITTIE Sluder, PA-C  "

## 2025-01-09 ENCOUNTER — Encounter: Payer: Self-pay | Admitting: Cardiology

## 2025-01-09 ENCOUNTER — Ambulatory Visit: Payer: Self-pay | Attending: Cardiology | Admitting: Cardiology

## 2025-01-09 VITALS — BP 124/78 | HR 66 | Ht 76.5 in | Wt 227.0 lb

## 2025-01-09 DIAGNOSIS — E782 Mixed hyperlipidemia: Secondary | ICD-10-CM | POA: Diagnosis not present

## 2025-01-09 DIAGNOSIS — I493 Ventricular premature depolarization: Secondary | ICD-10-CM | POA: Diagnosis not present

## 2025-01-09 DIAGNOSIS — R931 Abnormal findings on diagnostic imaging of heart and coronary circulation: Secondary | ICD-10-CM

## 2025-01-09 DIAGNOSIS — I480 Paroxysmal atrial fibrillation: Secondary | ICD-10-CM | POA: Diagnosis not present

## 2025-01-09 DIAGNOSIS — I77810 Thoracic aortic ectasia: Secondary | ICD-10-CM

## 2025-01-09 MED ORDER — LOSARTAN POTASSIUM 25 MG PO TABS: ORAL_TABLET | ORAL | Status: AC

## 2025-01-09 NOTE — Patient Instructions (Signed)
 Medication Instructions:  None  *If you need a refill on your cardiac medications before your next appointment, please call your pharmacy*  Lab Work: None  If you have labs (blood work) drawn today and your tests are completely normal, you will receive your results only by: MyChart Message (if you have MyChart) OR A paper copy in the mail If you have any lab test that is abnormal or we need to change your treatment, we will call you to review the results.  Testing/Procedures: Echocardiogram Your physician has requested that you have an echocardiogram. Echocardiography is a painless test that uses sound waves to create images of your heart. It provides your doctor with information about the size and shape of your heart and how well your hearts chambers and valves are working. This procedure takes approximately one hour. There are no restrictions for this procedure. Please do NOT wear cologne, perfume, aftershave, or lotions (deodorant is allowed). Please arrive 15 minutes prior to your appointment time.  Please note: We ask at that you not bring children with you during ultrasound (echo/ vascular) testing. Due to room size and safety concerns, children are not allowed in the ultrasound rooms during exams. Our front office staff cannot provide observation of children in our lobby area while testing is being conducted. An adult accompanying a patient to their appointment will only be allowed in the ultrasound room at the discretion of the ultrasound technician under special circumstances. We apologize for any inconvenience.   Follow-Up: At [facility], you and your health needs are our priority.  As part of our continuing mission to provide you with exceptional heart care, our providers are all part of one team.  This team includes your primary Cardiologist (physician) and Advanced Practice Providers or APPs (Physician Assistants and Nurse Practitioners) who all work together to provide  you with the care you need, when you need it.  Your next appointment:   1 year(s)  Provider:   Wilbert Bihari, MD    We recommend signing up for the patient portal called MyChart.  Sign up information is provided on this After Visit Summary.  MyChart is used to connect with patients for Virtual Visits (Telemedicine).  Patients are able to view lab/test results, encounter notes, upcoming appointments, etc.  Non-urgent messages can be sent to your provider as well.   To learn more about what you can do with MyChart, go to [email or web address].   Other Instructions None

## 2025-01-10 ENCOUNTER — Encounter (HOSPITAL_COMMUNITY): Payer: Self-pay | Admitting: Urology

## 2025-01-10 NOTE — Progress Notes (Signed)
 LITHO PREOP PHONE CALL   ALLERGIES REVIEWED: YES  MEDICATION REVIEW DONE: YES MEDICATIONS THAT PT SHOULD HOLD (LIST): NSAIDS hold 48hr ASA 72hr  CAN PT WALK UP STAIRS WITHOUT SHORTNESS OF BREATH: YES HOME O2: NO CPAP:  NO  IF YES, INFORMED PT TO BRING CPAP WITH TUBING AND MASK:YES/NO   INFORMED DRIVER NEEDED FOR PROCEDURE: YES  PT WAS GIVEN BLUE FOLDER AT UROLOGY APPT: YES PT INFORMED TO BRING BLUE FOLDER WITH ALL CONTENTS: YES  REVIEWED ARRIVAL TIME AND LOCATION: YES  OTHER PERTINENT INFORMATION: Hx of afib not on blood thinners, needs orders for procedure messaged Dr Cam

## 2025-01-13 ENCOUNTER — Ambulatory Visit (HOSPITAL_COMMUNITY)

## 2025-01-13 ENCOUNTER — Encounter (HOSPITAL_COMMUNITY): Payer: Self-pay | Admitting: Urology

## 2025-01-13 ENCOUNTER — Encounter (HOSPITAL_COMMUNITY)
Admission: RE | Disposition: A | Discharge status: Home / Self Care | Payer: Self-pay | Source: Home / Self Care | Attending: Urology

## 2025-01-13 ENCOUNTER — Other Ambulatory Visit: Payer: Self-pay

## 2025-01-13 ENCOUNTER — Ambulatory Visit (HOSPITAL_COMMUNITY)
Admission: RE | Admit: 2025-01-13 | Discharge: 2025-01-13 | Disposition: A | Discharge status: Home / Self Care | Attending: Urology | Admitting: Urology

## 2025-01-13 DIAGNOSIS — N202 Calculus of kidney with calculus of ureter: Secondary | ICD-10-CM | POA: Diagnosis not present

## 2025-01-13 DIAGNOSIS — N200 Calculus of kidney: Secondary | ICD-10-CM

## 2025-01-13 DIAGNOSIS — N201 Calculus of ureter: Secondary | ICD-10-CM | POA: Diagnosis present

## 2025-01-13 HISTORY — PX: EXTRACORPOREAL SHOCK WAVE LITHOTRIPSY: SHX1557

## 2025-01-13 MED ORDER — DIPHENHYDRAMINE HCL 25 MG PO CAPS
25.0000 mg | ORAL_CAPSULE | ORAL | Status: AC
  Administered 2025-01-13: 25 mg via ORAL
  Filled 2025-01-13: qty 1

## 2025-01-13 MED ORDER — DIAZEPAM 5 MG PO TABS
10.0000 mg | ORAL_TABLET | ORAL | Status: AC
  Administered 2025-01-13: 5 mg via ORAL
  Filled 2025-01-13: qty 2

## 2025-01-13 MED ORDER — CIPROFLOXACIN HCL 500 MG PO TABS
500.0000 mg | ORAL_TABLET | ORAL | Status: AC
  Administered 2025-01-13: 500 mg via ORAL
  Filled 2025-01-13: qty 1

## 2025-01-13 MED ORDER — SODIUM CHLORIDE 0.9 % IV SOLN: INTRAVENOUS | Status: DC

## 2025-01-13 NOTE — H&P (Signed)
 "  Cc: right ureteral stone  History of present illness: David Vargas is a 74 year-old male established patient who is here evaluation for treatment of prostate cancer.  01/08/25: David Vargas returns today in f/u. He had the onset midday on Monday of low back pain followed by nausea and vomiting. he had darker urine a week ago but no obvious blood. The pain was more on the right and was similar to prior stones. The pain resolved yesterday. He has no increased voiding symptoms but he has persistent nocturia with hesitancy. He remains on sildenafil, tadalafil and tamsulosin .  He had post biopsy sepsis with e. coli resistant to ampicillin, cipro , gentamycin and smx/tmp. It was sensitive to cephalosporins. He has moderate LUTS with an IPSS of 12.  Review of systems: A 12 point comprehensive review of systems was obtained and is negative unless otherwise stated in the history of present illness.  Patient Active Problem List   Diagnosis Date Noted   Agatston coronary artery calcium  score between 100 and 199    Malignant neoplasm of prostate (HCC) 05/09/2023   Syncope and collapse    Hemorrhagic shock (HCC)    Acute GI bleeding 08/10/2020   Atrial fibrillation with RVR (HCC) 08/10/2020   Acute blood loss anemia 08/10/2020   GI bleed 08/10/2020   E coli bacteremia 08/06/2020   Fever and chills 08/05/2020   Primary osteoarthritis of left knee 11/13/2017   Hyperlipidemia 11/06/2017   Preoperative clearance 11/06/2017   Paroxysmal atrial fibrillation with rapid ventricular response (HCC) 01/28/2014   Essential hypertension, benign 01/28/2014    Medications Ordered Prior to Encounter[1]  Past Medical History:  Diagnosis Date   Agatston coronary artery calcium  score between 100 and 199    Ca score 105 on CT 07/2020   Arthritis    Basal cell carcinoma    followed by dermatology   Cancer [facility])    melanoma - left shoulder 8 to 9 yrs ago   Complication of anesthesia    slow to wake up   GERD  (gastroesophageal reflux disease)    H/O colonoscopy    2009, polypoid colorectal mucosa found, Dr. Celestia advised repeat in 3 years   Hiatal hernia    upper GI in 2003   History of blood transfusion    7 units given summer 2021 for wallory-weiss tear   History of hypertension 06/22/2018   no htn in last 2 years per pt on 06-22-2021 after 60 pound weight loss   History of kidney stones    History of nuclear stress test 2005   abnormal, cardiac cath done    Left inguinal hernia 06/22/2021   PAF (paroxysmal atrial fibrillation) (HCC)    he has had 2 episodes in the past one associated with kidney stone and the other urosepsis.  CHA2DS2/VASC score 1 (age > 2 >>he has remote hx of HTN but this resolved after weight loss) therefore will not anticoagulate    Past Surgical History:  Procedure Laterality Date   BIOPSY PROSTATE  08/03/2020   CARDIAC CATHETERIZATION  2005   no evidence of significamt atherosclerosis   ESOPHAGOGASTRODUODENOSCOPY (EGD) WITH PROPOFOL  N/A 08/11/2020   Procedure: ESOPHAGOGASTRODUODENOSCOPY (EGD) WITH PROPOFOL ;  Surgeon: Saintclair Jasper, MD;  Location: [facility] ENDOSCOPY;  Service: Gastroenterology;  Laterality: N/A;   ESOPHAGOGASTRODUODENOSCOPY (EGD) WITH PROPOFOL  N/A 08/15/2020   Procedure: ESOPHAGOGASTRODUODENOSCOPY (EGD) WITH PROPOFOL ;  Surgeon: Donnald Lamar, MD;  Location[facility] ENDOSCOPY;  Service: Endoscopy;  Laterality: N/A;   EXTRACORPOREAL SHOCK WAVE LITHOTRIPSY Right 02/17/2020  Procedure: EXTRACORPOREAL SHOCK WAVE LITHOTRIPSY (ESWL);  Surgeon: Watt Rush, MD;  Location: [facility];  Service: Urology;  Laterality: Right;   HEMOSTASIS CLIP PLACEMENT  08/11/2020   Procedure: HEMOSTASIS CLIP PLACEMENT;  Surgeon: Saintclair Jasper, MD;  Location[facility] END[facility];  Service: Gastroenterology;;   HEMOSTASIS CONTROL  08/11/2020   Procedure: HEMOSTASIS CONTROL;  Surgeon: Saintclair Jasper, MD;  Location: [facility] ENDOSCOPY;  Service: Gastroenterology;;   HERNIA REPAIR  1990    right groin   INGUINAL HERNIA REPAIR Left 06/24/2021   Procedure: LAPAROSCOPIC LEFT INGUINAL HERNIA REPAIR WITH MESH;  Surgeon: Stechschulte, Deward PARAS, MD;  Location: [facility];  Service: General;  Laterality: Left;   KNEE SURGERY     left 1984, right 2010  Arthroscopic   SHOULDER SURGERY Right    rotator cuff repair   TOTAL KNEE ARTHROPLASTY Left 11/28/2017   Procedure: TOTAL KNEE ARTHROPLASTY;  Surgeon: Beverley Evalene BIRCH, MD;  Location: [facility] OR;  Service: Orthopedics;  Laterality: Left;    Social History[2]  Family History  Problem Relation Age of Onset   Heart attack Mother    Liver cancer Father    Multiple sclerosis Sister     PE: Vitals:   01/13/25 0715  BP: 123/88  Resp: 18  Temp: 98.4 F (36.9 C)  TempSrc: Oral  SpO2: 97%  Weight: 100 kg  Height: 6' 4.5 (1.943 m)   Patient appears to be in no acute distress  patient is alert and oriented x3 Atraumatic normocephalic head No cervical or supraclavicular lymphadenopathy appreciated No increased work of breathing, no audible wheezes/rhonchi Regular sinus rhythm/rate Abdomen is soft, nontender, nondistended, no CVA or suprapubic tenderness Lower extremities are symmetric without appreciable edema Grossly neurologically intact No identifiable skin lesions  No results for input(s): WBC, HGB, HCT in the last 72 hours. No results for input(s): NA, K, CL, CO2, GLUCOSE, BUN, CREATININE, CALCIUM  in the last 72 hours. No results for input(s): LABPT, INR in the last 72 hours. No results for input(s): LABURIN in the last 72 hours. Results for orders placed or performed during the hospital encounter of 08/10/20  SARS Coronavirus 2 by RT PCR (hospital order, performed in [facility] hospital lab) Nasopharyngeal Nasopharyngeal Swab     Status: None   Collection Time: 08/10/20  5:01 AM   Specimen: Nasopharyngeal Swab  Result Value Ref Range Status   SARS Coronavirus 2 NEGATIVE NEGATIVE  Final    Comment: (NOTE) SARS-CoV-2 target nucleic acids are NOT DETECTED.  The SARS-CoV-2 RNA is generally detectable in upper and lower respiratory specimens during the acute phase of infection. The lowest concentration of SARS-CoV-2 viral copies this assay can detect is 250 copies / mL. A negative result does not preclude SARS-CoV-2 infection and should not be used as the sole basis for treatment or other patient management decisions.  A negative result may occur with improper specimen collection / handling, submission of specimen other than nasopharyngeal swab, presence of viral mutation(s) within the areas targeted by this assay, and inadequate number of viral copies (<250 copies / mL). A negative result must be combined with clinical observations, patient history, and epidemiological information.  Fact Sheet for Patients:   boilerbrush.com.cy  Fact Sheet for Healthcare Providers: https://pope.com/  This test is not yet approved or  cleared by the [place]  FDA and has been authorized for detection and/or diagnosis of SARS-CoV-2 by FDA under an Emergency Use Authorization (EUA).  This EUA will remain in effect (meaning this test can  be used) for the duration of the COVID-19 declaration under Section 564(b)(1) of the Act, 21 U.S.C. section 360bbb-3(b)(1), unless the authorization is terminated or revoked sooner.  Performed at [facility], [address], [place], [place] [address]   MRSA PCR Screening     Status: None   Collection Time: 08/11/20  8:52 PM   Specimen: Nasal Mucosa; Nasopharyngeal  Result Value Ref Range Status   MRSA by PCR NEGATIVE NEGATIVE Final    Comment:        The GeneXpert MRSA Assay (FDA approved for NASAL specimens only), is one component of a comprehensive MRSA colonization surveillance program. It is not intended to diagnose MRSA infection nor to guide or monitor treatment for MRSA  infections. Performed at [facility], [address] [place], [place] [address]     Imaging: I reviewed his Xrays from clinic as well as the pre-op KUB showing a stone in the right mid-ureter.  Imp:  Right obstructing ureteral stone.  Recommendations:  Plan to proceed with ESWL.   David Vargas       [1]  No current facility-administered medications on file prior to encounter.   Current Outpatient Medications on File Prior to Encounter  Medication Sig Dispense Refill   ezetimibe  (ZETIA ) 10 MG tablet Take 1 tablet (10 mg total) by mouth daily. 90 tablet 3   famotidine  (PEPCID ) 20 MG tablet Take 20 mg by mouth as needed.     fluticasone (FLONASE) 50 MCG/ACT nasal spray Place into both nostrils.     rosuvastatin  (CRESTOR ) 5 MG tablet Take 1 tablet (5 mg total) by mouth 3 (three) times a week. 36 tablet 3   tadalafil (CIALIS) 10 MG tablet Take 5 mg by mouth daily as needed for erectile dysfunction.      tamsulosin  (FLOMAX ) 0.4 MG CAPS capsule Take 0.4 mg by mouth as needed. For kidney stones     acetaminophen  (TYLENOL ) 325 MG tablet Take 650 mg by mouth every 6 (six) hours as needed for mild pain or fever.     Coenzyme Q10 (COQ10 PO) Take by mouth daily.     COMIRNATY syringe      pantoprazole  (PROTONIX ) 40 MG tablet Take 1 tablet (40 mg total) by mouth daily. 30 tablet 0  [2]  Social History Tobacco Use   Smoking status: Never   Smokeless tobacco: Never  Vaping Use   Vaping status: Never Used  Substance Use Topics   Alcohol use: Yes    Comment: 4 to 5 x week 1 drink   Drug use: No   "

## 2025-01-13 NOTE — Discharge Instructions (Signed)
 See [facility] discharge instructions in chart.

## 2025-01-13 NOTE — Interval H&P Note (Signed)
 History and Physical Interval Note:  01/13/2025 9:41 AM  David Vargas  has presented today for surgery, with the diagnosis of RIGHT URETERAL STONE.  The various methods of treatment have been discussed with the patient and family. After consideration of risks, benefits and other options for treatment, the patient has consented to  Procedures: LITHOTRIPSY, ESWL (Right) as a surgical intervention.  The patient's history has been reviewed, patient examined, no change in status, stable for surgery.  I have reviewed the patient's chart and labs.  Questions were answered to the patient's satisfaction.     Morene LELON Salines

## 2025-01-14 ENCOUNTER — Encounter (HOSPITAL_COMMUNITY): Payer: Self-pay | Admitting: Urology

## 2025-01-14 ENCOUNTER — Ambulatory Visit: Payer: Self-pay

## 2025-02-25 ENCOUNTER — Ambulatory Visit (HOSPITAL_COMMUNITY)
